# Patient Record
Sex: Female | Born: 1955 | Race: White | Hispanic: No | Marital: Married | Smoking: Former smoker
Health system: Southern US, Community
[De-identification: ages and names within clinical notes are randomized; demographics above are authoritative.]

## PROBLEM LIST (undated history)

## (undated) DIAGNOSIS — M75100 Unspecified rotator cuff tear or rupture of unspecified shoulder, not specified as traumatic: Secondary | ICD-10-CM

## (undated) DIAGNOSIS — M8580 Other specified disorders of bone density and structure, unspecified site: Secondary | ICD-10-CM

## (undated) DIAGNOSIS — R7303 Prediabetes: Secondary | ICD-10-CM

## (undated) DIAGNOSIS — B009 Herpesviral infection, unspecified: Secondary | ICD-10-CM

## (undated) DIAGNOSIS — F329 Major depressive disorder, single episode, unspecified: Secondary | ICD-10-CM

## (undated) DIAGNOSIS — T7840XA Allergy, unspecified, initial encounter: Secondary | ICD-10-CM

## (undated) DIAGNOSIS — K219 Gastro-esophageal reflux disease without esophagitis: Secondary | ICD-10-CM

## (undated) DIAGNOSIS — H269 Unspecified cataract: Secondary | ICD-10-CM

## (undated) DIAGNOSIS — J45909 Unspecified asthma, uncomplicated: Secondary | ICD-10-CM

## (undated) DIAGNOSIS — M722 Plantar fascial fibromatosis: Secondary | ICD-10-CM

## (undated) DIAGNOSIS — M7581 Other shoulder lesions, right shoulder: Secondary | ICD-10-CM

## (undated) DIAGNOSIS — R519 Headache, unspecified: Secondary | ICD-10-CM

## (undated) DIAGNOSIS — E785 Hyperlipidemia, unspecified: Secondary | ICD-10-CM

## (undated) DIAGNOSIS — F419 Anxiety disorder, unspecified: Secondary | ICD-10-CM

## (undated) DIAGNOSIS — E7800 Pure hypercholesterolemia, unspecified: Secondary | ICD-10-CM

## (undated) DIAGNOSIS — B379 Candidiasis, unspecified: Secondary | ICD-10-CM

## (undated) DIAGNOSIS — F319 Bipolar disorder, unspecified: Secondary | ICD-10-CM

## (undated) DIAGNOSIS — G43909 Migraine, unspecified, not intractable, without status migrainosus: Secondary | ICD-10-CM

## (undated) DIAGNOSIS — F32A Depression, unspecified: Secondary | ICD-10-CM

## (undated) DIAGNOSIS — A6000 Herpesviral infection of urogenital system, unspecified: Secondary | ICD-10-CM

## (undated) DIAGNOSIS — I10 Essential (primary) hypertension: Secondary | ICD-10-CM

## (undated) HISTORY — DX: Allergy, unspecified, initial encounter: T78.40XA

## (undated) HISTORY — DX: Hyperlipidemia, unspecified: E78.5

## (undated) HISTORY — DX: Headache, unspecified: R51.9

## (undated) HISTORY — DX: Other shoulder lesions, right shoulder: M75.81

## (undated) HISTORY — DX: Pure hypercholesterolemia, unspecified: E78.00

## (undated) HISTORY — DX: Bipolar disorder, unspecified: F31.9

## (undated) HISTORY — PX: CATARACT EXTRACTION, BILATERAL: SHX1313

## (undated) HISTORY — DX: Gastro-esophageal reflux disease without esophagitis: K21.9

## (undated) HISTORY — DX: Unspecified rotator cuff tear or rupture of unspecified shoulder, not specified as traumatic: M75.100

## (undated) HISTORY — DX: Major depressive disorder, single episode, unspecified: F32.9

## (undated) HISTORY — DX: Unspecified cataract: H26.9

## (undated) HISTORY — DX: Herpesviral infection, unspecified: B00.9

## (undated) HISTORY — DX: Depression, unspecified: F32.A

## (undated) HISTORY — DX: Migraine, unspecified, not intractable, without status migrainosus: G43.909

## (undated) HISTORY — DX: Unspecified asthma, uncomplicated: J45.909

## (undated) HISTORY — DX: Anxiety disorder, unspecified: F41.9

## (undated) HISTORY — DX: Prediabetes: R73.03

## (undated) HISTORY — DX: Plantar fascial fibromatosis: M72.2

## (undated) HISTORY — DX: Essential (primary) hypertension: I10

## (undated) HISTORY — PX: KNEE SURGERY: SHX244

---

## 1985-12-09 HISTORY — PX: CHOLECYSTECTOMY: SHX55

## 2000-07-08 ENCOUNTER — Encounter: Admission: RE | Admit: 2000-07-08 | Discharge: 2000-07-08 | Payer: Self-pay | Admitting: *Deleted

## 2000-07-08 ENCOUNTER — Encounter: Payer: Self-pay | Admitting: Family Medicine

## 2001-08-05 ENCOUNTER — Encounter: Admission: RE | Admit: 2001-08-05 | Discharge: 2001-08-05 | Payer: Self-pay | Admitting: Family Medicine

## 2001-08-05 ENCOUNTER — Encounter: Payer: Self-pay | Admitting: Family Medicine

## 2002-04-26 ENCOUNTER — Encounter (HOSPITAL_COMMUNITY): Admission: RE | Admit: 2002-04-26 | Discharge: 2002-05-26 | Payer: Self-pay | Admitting: Family Medicine

## 2002-05-20 ENCOUNTER — Ambulatory Visit (HOSPITAL_COMMUNITY): Admission: RE | Admit: 2002-05-20 | Discharge: 2002-05-20 | Payer: Self-pay | Admitting: Family Medicine

## 2002-05-20 ENCOUNTER — Encounter: Payer: Self-pay | Admitting: Family Medicine

## 2002-06-19 ENCOUNTER — Emergency Department (HOSPITAL_COMMUNITY): Admission: EM | Admit: 2002-06-19 | Discharge: 2002-06-20 | Payer: Self-pay | Admitting: Internal Medicine

## 2002-06-21 ENCOUNTER — Ambulatory Visit (HOSPITAL_COMMUNITY): Admission: RE | Admit: 2002-06-21 | Discharge: 2002-06-21 | Payer: Self-pay | Admitting: Internal Medicine

## 2002-06-21 ENCOUNTER — Encounter: Payer: Self-pay | Admitting: Internal Medicine

## 2002-09-09 ENCOUNTER — Ambulatory Visit (HOSPITAL_COMMUNITY): Admission: RE | Admit: 2002-09-09 | Discharge: 2002-09-09 | Payer: Self-pay | Admitting: Family Medicine

## 2002-09-09 ENCOUNTER — Encounter: Payer: Self-pay | Admitting: Family Medicine

## 2002-09-15 ENCOUNTER — Ambulatory Visit (HOSPITAL_COMMUNITY): Admission: RE | Admit: 2002-09-15 | Discharge: 2002-09-15 | Payer: Self-pay | Admitting: Family Medicine

## 2002-09-15 ENCOUNTER — Encounter: Payer: Self-pay | Admitting: Family Medicine

## 2002-11-15 ENCOUNTER — Emergency Department (HOSPITAL_COMMUNITY): Admission: EM | Admit: 2002-11-15 | Discharge: 2002-11-15 | Payer: Self-pay | Admitting: Emergency Medicine

## 2003-03-03 ENCOUNTER — Encounter: Payer: Self-pay | Admitting: *Deleted

## 2003-03-04 ENCOUNTER — Observation Stay (HOSPITAL_COMMUNITY): Admission: EM | Admit: 2003-03-04 | Discharge: 2003-03-05 | Payer: Self-pay | Admitting: *Deleted

## 2003-06-27 ENCOUNTER — Encounter: Payer: Self-pay | Admitting: Family Medicine

## 2003-06-27 ENCOUNTER — Ambulatory Visit (HOSPITAL_COMMUNITY): Admission: RE | Admit: 2003-06-27 | Discharge: 2003-06-27 | Payer: Self-pay | Admitting: Family Medicine

## 2003-06-28 ENCOUNTER — Encounter (HOSPITAL_COMMUNITY): Admission: RE | Admit: 2003-06-28 | Discharge: 2003-07-28 | Payer: Self-pay | Admitting: Family Medicine

## 2003-08-22 ENCOUNTER — Inpatient Hospital Stay (HOSPITAL_COMMUNITY): Admission: EM | Admit: 2003-08-22 | Discharge: 2003-08-29 | Payer: Self-pay | Admitting: Psychiatry

## 2003-09-12 ENCOUNTER — Encounter: Payer: Self-pay | Admitting: Family Medicine

## 2003-09-12 ENCOUNTER — Ambulatory Visit (HOSPITAL_COMMUNITY): Admission: RE | Admit: 2003-09-12 | Discharge: 2003-09-12 | Payer: Self-pay | Admitting: Family Medicine

## 2004-02-24 ENCOUNTER — Ambulatory Visit (HOSPITAL_COMMUNITY): Admission: RE | Admit: 2004-02-24 | Discharge: 2004-02-24 | Payer: Self-pay | Admitting: Family Medicine

## 2004-06-11 ENCOUNTER — Emergency Department (HOSPITAL_COMMUNITY): Admission: EM | Admit: 2004-06-11 | Discharge: 2004-06-11 | Payer: Self-pay | Admitting: Emergency Medicine

## 2004-08-22 ENCOUNTER — Ambulatory Visit: Payer: Self-pay | Admitting: Psychiatry

## 2004-09-13 ENCOUNTER — Ambulatory Visit (HOSPITAL_COMMUNITY): Admission: RE | Admit: 2004-09-13 | Discharge: 2004-09-13 | Payer: Self-pay | Admitting: Family Medicine

## 2004-11-08 ENCOUNTER — Ambulatory Visit: Payer: Self-pay | Admitting: Psychiatry

## 2004-12-17 ENCOUNTER — Ambulatory Visit: Payer: Self-pay | Admitting: Psychiatry

## 2005-01-30 ENCOUNTER — Ambulatory Visit: Payer: Self-pay | Admitting: Psychiatry

## 2005-04-02 ENCOUNTER — Ambulatory Visit: Payer: Self-pay | Admitting: Psychiatry

## 2005-05-02 ENCOUNTER — Emergency Department (HOSPITAL_COMMUNITY): Admission: EM | Admit: 2005-05-02 | Discharge: 2005-05-02 | Payer: Self-pay | Admitting: Emergency Medicine

## 2005-05-28 ENCOUNTER — Ambulatory Visit: Payer: Self-pay | Admitting: Psychiatry

## 2005-07-02 ENCOUNTER — Ambulatory Visit: Payer: Self-pay | Admitting: Psychiatry

## 2005-12-09 DIAGNOSIS — H269 Unspecified cataract: Secondary | ICD-10-CM

## 2005-12-09 HISTORY — DX: Unspecified cataract: H26.9

## 2006-01-02 ENCOUNTER — Ambulatory Visit: Payer: Self-pay | Admitting: Psychiatry

## 2006-02-12 ENCOUNTER — Ambulatory Visit (HOSPITAL_COMMUNITY): Payer: Self-pay | Admitting: Psychiatry

## 2006-02-20 ENCOUNTER — Ambulatory Visit (HOSPITAL_COMMUNITY): Payer: Self-pay | Admitting: Psychiatry

## 2006-02-25 ENCOUNTER — Ambulatory Visit (HOSPITAL_COMMUNITY): Admission: RE | Admit: 2006-02-25 | Discharge: 2006-02-25 | Payer: Self-pay | Admitting: Family Medicine

## 2006-04-14 ENCOUNTER — Ambulatory Visit (HOSPITAL_COMMUNITY): Admission: RE | Admit: 2006-04-14 | Discharge: 2006-04-14 | Payer: Self-pay | Admitting: Family Medicine

## 2006-04-15 ENCOUNTER — Ambulatory Visit (HOSPITAL_COMMUNITY): Payer: Self-pay | Admitting: Psychiatry

## 2006-06-23 ENCOUNTER — Encounter (INDEPENDENT_AMBULATORY_CARE_PROVIDER_SITE_OTHER): Payer: Self-pay | Admitting: Specialist

## 2006-06-23 ENCOUNTER — Observation Stay (HOSPITAL_COMMUNITY): Admission: RE | Admit: 2006-06-23 | Discharge: 2006-06-24 | Payer: Self-pay | Admitting: Obstetrics and Gynecology

## 2006-07-23 ENCOUNTER — Ambulatory Visit (HOSPITAL_COMMUNITY): Admission: RE | Admit: 2006-07-23 | Discharge: 2006-07-23 | Payer: Self-pay | Admitting: Family Medicine

## 2006-08-04 ENCOUNTER — Ambulatory Visit: Admission: RE | Admit: 2006-08-04 | Discharge: 2006-08-04 | Payer: Self-pay | Admitting: Family Medicine

## 2006-08-07 ENCOUNTER — Ambulatory Visit: Payer: Self-pay | Admitting: Pulmonary Disease

## 2006-09-18 ENCOUNTER — Ambulatory Visit (HOSPITAL_COMMUNITY): Admission: RE | Admit: 2006-09-18 | Discharge: 2006-09-18 | Payer: Self-pay | Admitting: Family Medicine

## 2006-11-18 ENCOUNTER — Ambulatory Visit (HOSPITAL_COMMUNITY): Payer: Self-pay | Admitting: Psychiatry

## 2006-12-02 ENCOUNTER — Emergency Department (HOSPITAL_COMMUNITY): Admission: EM | Admit: 2006-12-02 | Discharge: 2006-12-03 | Payer: Self-pay | Admitting: Emergency Medicine

## 2006-12-11 ENCOUNTER — Ambulatory Visit (HOSPITAL_COMMUNITY): Payer: Self-pay | Admitting: Psychiatry

## 2006-12-29 ENCOUNTER — Ambulatory Visit (HOSPITAL_COMMUNITY): Payer: Self-pay | Admitting: Psychiatry

## 2006-12-30 ENCOUNTER — Ambulatory Visit (HOSPITAL_COMMUNITY): Payer: Self-pay | Admitting: Psychiatry

## 2007-02-09 ENCOUNTER — Ambulatory Visit (HOSPITAL_COMMUNITY): Payer: Self-pay | Admitting: Psychiatry

## 2007-02-26 ENCOUNTER — Ambulatory Visit (HOSPITAL_COMMUNITY): Payer: Self-pay | Admitting: Psychiatry

## 2007-04-28 ENCOUNTER — Ambulatory Visit (HOSPITAL_COMMUNITY): Payer: Self-pay | Admitting: Psychiatry

## 2007-04-30 ENCOUNTER — Ambulatory Visit (HOSPITAL_COMMUNITY): Payer: Self-pay | Admitting: Psychiatry

## 2007-07-09 ENCOUNTER — Ambulatory Visit (HOSPITAL_COMMUNITY): Payer: Self-pay | Admitting: Psychiatry

## 2007-07-21 ENCOUNTER — Ambulatory Visit (HOSPITAL_COMMUNITY): Payer: Self-pay | Admitting: Psychiatry

## 2007-07-22 ENCOUNTER — Encounter (HOSPITAL_COMMUNITY): Admission: RE | Admit: 2007-07-22 | Discharge: 2007-08-21 | Payer: Self-pay | Admitting: Family Medicine

## 2007-07-23 ENCOUNTER — Ambulatory Visit (HOSPITAL_COMMUNITY): Payer: Self-pay | Admitting: Psychiatry

## 2007-07-31 ENCOUNTER — Ambulatory Visit (HOSPITAL_COMMUNITY): Payer: Self-pay | Admitting: Psychiatry

## 2007-08-07 ENCOUNTER — Ambulatory Visit (HOSPITAL_COMMUNITY): Payer: Self-pay | Admitting: Psychiatry

## 2007-08-19 ENCOUNTER — Ambulatory Visit (HOSPITAL_COMMUNITY): Payer: Self-pay | Admitting: Psychiatry

## 2007-09-07 ENCOUNTER — Ambulatory Visit (HOSPITAL_COMMUNITY): Payer: Self-pay | Admitting: Psychiatry

## 2007-09-15 ENCOUNTER — Ambulatory Visit (HOSPITAL_COMMUNITY): Payer: Self-pay | Admitting: Psychiatry

## 2007-10-07 ENCOUNTER — Ambulatory Visit (HOSPITAL_COMMUNITY): Payer: Self-pay | Admitting: Psychiatry

## 2007-10-12 ENCOUNTER — Ambulatory Visit (HOSPITAL_COMMUNITY): Payer: Self-pay | Admitting: Psychiatry

## 2007-10-29 ENCOUNTER — Ambulatory Visit (HOSPITAL_COMMUNITY): Payer: Self-pay | Admitting: Psychiatry

## 2007-11-09 ENCOUNTER — Ambulatory Visit (HOSPITAL_COMMUNITY): Payer: Self-pay | Admitting: Psychiatry

## 2007-11-10 ENCOUNTER — Ambulatory Visit (HOSPITAL_COMMUNITY): Payer: Self-pay | Admitting: Psychiatry

## 2007-11-20 ENCOUNTER — Ambulatory Visit (HOSPITAL_COMMUNITY): Payer: Self-pay | Admitting: Psychiatry

## 2008-01-29 ENCOUNTER — Ambulatory Visit (HOSPITAL_COMMUNITY): Payer: Self-pay | Admitting: Psychiatry

## 2008-02-11 ENCOUNTER — Ambulatory Visit (HOSPITAL_COMMUNITY): Payer: Self-pay | Admitting: Psychiatry

## 2008-02-24 ENCOUNTER — Ambulatory Visit (HOSPITAL_COMMUNITY): Payer: Self-pay | Admitting: Psychiatry

## 2008-04-21 ENCOUNTER — Ambulatory Visit (HOSPITAL_COMMUNITY): Payer: Self-pay | Admitting: Psychiatry

## 2008-05-10 ENCOUNTER — Ambulatory Visit (HOSPITAL_COMMUNITY): Payer: Self-pay | Admitting: Psychiatry

## 2008-05-13 ENCOUNTER — Ambulatory Visit (HOSPITAL_COMMUNITY): Payer: Self-pay | Admitting: Psychiatry

## 2008-05-24 ENCOUNTER — Ambulatory Visit (HOSPITAL_COMMUNITY): Payer: Self-pay | Admitting: Psychiatry

## 2008-08-11 ENCOUNTER — Ambulatory Visit (HOSPITAL_COMMUNITY): Payer: Self-pay | Admitting: Psychiatry

## 2008-08-23 ENCOUNTER — Ambulatory Visit (HOSPITAL_COMMUNITY): Admission: RE | Admit: 2008-08-23 | Discharge: 2008-08-23 | Payer: Self-pay | Admitting: Family Medicine

## 2008-09-17 ENCOUNTER — Emergency Department (HOSPITAL_COMMUNITY): Admission: EM | Admit: 2008-09-17 | Discharge: 2008-09-17 | Payer: Self-pay | Admitting: Emergency Medicine

## 2008-10-07 ENCOUNTER — Ambulatory Visit (HOSPITAL_COMMUNITY): Payer: Self-pay | Admitting: Psychiatry

## 2008-11-08 ENCOUNTER — Ambulatory Visit (HOSPITAL_COMMUNITY): Payer: Self-pay | Admitting: Psychiatry

## 2008-11-09 ENCOUNTER — Ambulatory Visit (HOSPITAL_COMMUNITY): Payer: Self-pay | Admitting: Psychiatry

## 2008-12-21 ENCOUNTER — Ambulatory Visit (HOSPITAL_COMMUNITY): Payer: Self-pay | Admitting: Psychiatry

## 2008-12-22 ENCOUNTER — Ambulatory Visit (HOSPITAL_COMMUNITY): Payer: Self-pay | Admitting: Psychiatry

## 2009-02-09 ENCOUNTER — Ambulatory Visit (HOSPITAL_COMMUNITY): Payer: Self-pay | Admitting: Psychiatry

## 2009-03-06 ENCOUNTER — Ambulatory Visit (HOSPITAL_COMMUNITY): Payer: Self-pay | Admitting: Psychiatry

## 2009-03-12 ENCOUNTER — Emergency Department (HOSPITAL_COMMUNITY): Admission: EM | Admit: 2009-03-12 | Discharge: 2009-03-12 | Payer: Self-pay | Admitting: Emergency Medicine

## 2009-03-16 ENCOUNTER — Ambulatory Visit (HOSPITAL_COMMUNITY): Admission: RE | Admit: 2009-03-16 | Discharge: 2009-03-16 | Payer: Self-pay | Admitting: Family Medicine

## 2009-03-23 ENCOUNTER — Ambulatory Visit (HOSPITAL_COMMUNITY): Payer: Self-pay | Admitting: Psychiatry

## 2009-04-13 ENCOUNTER — Emergency Department (HOSPITAL_COMMUNITY): Admission: EM | Admit: 2009-04-13 | Discharge: 2009-04-13 | Payer: Self-pay | Admitting: Emergency Medicine

## 2009-04-24 ENCOUNTER — Ambulatory Visit: Payer: Self-pay | Admitting: Orthopedic Surgery

## 2009-04-24 DIAGNOSIS — G8929 Other chronic pain: Secondary | ICD-10-CM | POA: Insufficient documentation

## 2009-04-24 DIAGNOSIS — M25519 Pain in unspecified shoulder: Secondary | ICD-10-CM

## 2009-04-24 DIAGNOSIS — M7580 Other shoulder lesions, unspecified shoulder: Secondary | ICD-10-CM

## 2009-04-26 ENCOUNTER — Encounter: Payer: Self-pay | Admitting: Orthopedic Surgery

## 2010-12-29 ENCOUNTER — Encounter: Payer: Self-pay | Admitting: Family Medicine

## 2011-03-19 LAB — DIFFERENTIAL
Basophils Absolute: 0 10*3/uL (ref 0.0–0.1)
Basophils Relative: 0 % (ref 0–1)
Eosinophils Absolute: 0.4 10*3/uL (ref 0.0–0.7)
Eosinophils Relative: 5 % (ref 0–5)
Monocytes Relative: 8 % (ref 3–12)
Neutrophils Relative %: 63 % (ref 43–77)

## 2011-03-19 LAB — POCT I-STAT, CHEM 8
BUN: 25 mg/dL — ABNORMAL HIGH (ref 6–23)
Calcium, Ion: 1.16 mmol/L (ref 1.12–1.32)
Chloride: 110 mEq/L (ref 96–112)
Creatinine, Ser: 1.3 mg/dL — ABNORMAL HIGH (ref 0.4–1.2)
Glucose, Bld: 98 mg/dL (ref 70–99)
Hemoglobin: 12.9 g/dL (ref 12.0–15.0)

## 2011-03-19 LAB — GLUCOSE, CAPILLARY: Glucose-Capillary: 98 mg/dL (ref 70–99)

## 2011-03-19 LAB — CBC
MCHC: 33.5 g/dL (ref 30.0–36.0)
WBC: 8.6 10*3/uL (ref 4.0–10.5)

## 2011-04-26 NOTE — Op Note (Signed)
NAME:  Peggy Fitzgerald, Peggy Fitzgerald           ACCOUNT NO.:  [id number]   MEDICAL RECORD NO.:  [id number]          PATIENT TYPE:  AMB   LOCATION:  DAY                           FACILITY:  [facility]   PHYSICIAN:  Tilda Burrow, M.D. DATE OF BIRTH:  [birth year]   DATE OF PROCEDURE:  06/23/2006  DATE OF DISCHARGE:                                 OPERATIVE REPORT   PREOPERATIVE DIAGNOSIS:  Endometrial polyp, rectocele.   POSTOPERATIVE DIAGNOSIS:  Endometrial polyp, rectocele.   PROCEDURE:  Hysteroscopy, D&C, followed by posterior vaginal repair.   SURGEON:  Tilda Burrow, M.D.   ASSISTANTAmie Critchley, CST.   ANESTHESIA:  General.   COMPLICATIONS:  None.   FINDINGS:  Thinner endometrium than suspected.  No evidence of endometrial  polyp.  Curettage resulted in only tiny atrophic tissue, scanty amounts, and  hysteroscopy confirmed no significant polyps in the endometrium.  The  etiology of the abnormal ultrasound is unclear.   DETAILS OF PROCEDURE:  The patient was taken to the operating room and  prepped and draped for vaginal procedure.  With the cervix grasped with a  single-tooth tenaculum, cervix dilated to 27 [place], followed by  visualization with a 30-degree hysteroscope.  This showed a then  endometrium.  Both tubal ostia could be seen bilaterally.  The endometrial  polyp suspected from prior biopsy was not present.  Curettage was performed,  but the amount of tissue was so scanty as to be nondiagnostic.  Since we had  a prior outpatient biopsy that was negative, this was not necessary to be  sent, so therefore efforts to collect enough for a sample were abandoned.   Posterior repair:  The posterior vaginal mucosa was grasped with two Allis  clamps at the hymen remnants, and a third clamp placed approximately 5 cm up  the posterior vaginal wall.  The mucosa was split, undermined from the  adjacent tissues, and we were able to identify a distinct band of perineal  tissue that could  be pulled into the midline and improve the perineal  support.  This was grasped with Allis clamps, while a double-gloved right  index finger was placed in the rectum.  Sutures were then placed, pulling  the endoperineal tissue in to reinforce the perineal body and reduce the  rectocele.  This was judged to be quite successful.   The vaginal mucosa was trimmed of some irregular redundant edges, and then  after a series of approximately 8 deep stitches of 0 Dexon, two additional  stitches were placed to rebuild the perineal body and reduce the introital  size slightly.  This was successful, and then the remaining vaginal mucosa  was trimmed and pulled together with 2-0 chromic in a running fashion.  The  patient tolerated the procedure well.  Sponge and needle counts were  correct.      Tilda Burrow, M.D.  Electronically Signed     JVF/MEDQ  D:  06/23/2006  T:  06/23/2006  Job:  [id number]

## 2011-04-26 NOTE — Procedures (Signed)
NAME:  Peggy Fitzgerald, Peggy Fitzgerald           ACCOUNT NO.:  [id number]   MEDICAL RECORD NO.:  [id number]          PATIENT TYPE:  OUT   LOCATION:  SLEEP LAB                     FACILITY:  [facility]   PHYSICIAN:  Barbaraann Share, MD,FCCPDATE OF BIRTH:  [birth year]   DATE OF STUDY:  08/04/2006                              NOCTURNAL POLYSOMNOGRAM   REFERRING PHYSICIAN:  Dr. Lilyan Punt   INDICATION FOR THE STUDY:  Persistent disorder of initiating and maintaining  sleep; also, hypersomnia with sleep apnea.   EPWORTH SCORE:  9   SLEEP ARCHITECTURE:  The patient had a total sleep time of 379 minutes with  decreased REM and very little slow wave sleep.  Sleep onset latency was  normal and REM onset was prolonged at 208 minutes.  Sleep efficiency was  decreased at 82%.   RESPIRATORY DATA:  The patient underwent split night protocol where she was  found to have 45 events in the first 93 minutes of sleep.  This gave her a  respiratory disturbance index of 14 events per hour.  The patient was noted  to have moderate snoring, and the events were worse in the supine position.  By protocol the patient was placed on a Respironics small Comfort Select  nasal mask and chin strap was added because of mouth opening.  CPAP  titration was initiated and increased as high as 7 cm of water pressure.  During this time the patient failed to have complete control of her  obstructive events, and there was an increased number of central events.  The pressure was not increased further because of a lack time in order to  achieve adequate titration.   OXYGEN DATA:  There was O2 desaturation as low as 89%.   CARDIAC DATA:  No clinically significant cardiac arrhythmias.   MOVEMENTS/PARASOMNIA:  The patient was found have 69 leg jerks with 1.3 per  hour resulting in arousal or awakening.  There were no abnormal behaviors  noted.   IMPRESSION/RECOMMENDATION:  1. Split night study reveals mild obstructive sleep  apnea/hypopnea      syndrome with a respiratory disturbance index of 14 events per hour      during the first half of the night and oxygen desaturation as low as      89%.  The patient was then placed on a Respironics small Comfort Select      mask with chin strap and was titrated to 7 cm of water.  At this level      the patient continued to have obstructive events and was also noted to      have increased numbers of central events.  Appropriate CPAP titration      was not able to be obtained secondary to a lack of time during the      study, as well as the patient having mixed sleep apnea.  At this point      in time I would highly recommend a formal CPAP titration study where we      can try and resolve both the patient's obstructive and central events.      She may have to be changed over  to BiPAP in order to decrease the      numbers of centrals.  2. Small to moderate numbers of leg jerks with very little sleep      disruption.                                            ______________________________  Barbaraann Share, MD,FCCP  Diplomate, American Board of Sleep  Medicine     KMC/MEDQ  D:  08/08/2006 10:24:44  T:  08/08/2006 16:52:28  Job:  [id number]

## 2011-04-26 NOTE — Discharge Summary (Signed)
   NAME:  Peggy Fitzgerald, Peggy Fitzgerald                     ACCOUNT NO.:  [id number]   MEDICAL RECORD NO.:  [id number]                   PATIENT TYPE:  OBV   LOCATION:  A321                                 FACILITY:  [facility]   PHYSICIAN:  Scott A. Gerda Diss, M.D.               DATE OF BIRTH:  1956-09-30   DATE OF ADMISSION:  03/03/2003  DATE OF DISCHARGE:  03/05/2003                                 DISCHARGE SUMMARY   DISCHARGE DIAGNOSES:  1. Viral gastroenteritis.  2. Serotonin withdrawal syndrome.   HISTORY OF PRESENT ILLNESS:  This 55 year old white female presented with  nausea, dizziness, vomiting with a chief complaint per above.  The patient  had three to four days of feeling dizzy, nauseous, vomiting, not feeling  good.  Denied diarrhea, denied fever or chills, related headache and some  muscle aches, some discomfort.  She had run out of her Effexor approximately  a week previous.   PAST MEDICAL HISTORY:  1. Migraines.  2. Depression.   SOCIAL HISTORY:  Under a lot of stress.  Husband lives in [place].  She  lives here and has a pregnant daughter and a son-in-law who is still not  working.   REVIEW OF SYSTEMS:  Per above.   PHYSICAL EXAMINATION:  HEENT:  Benign.  CHEST:  CTA.  HEART:  Regular.  ABDOMEN:  Soft.  Pulses normal.  EXTREMITIES:  No edema.   LABORATORY DATA:  Unremarkable.   HOSPITAL COURSE:  The patient was given IV fluids, pain medications, and  restarted on Effexor and started feeling much better within 24 to 36 hours.  On the morning of 03/05/2003 she was feeling better, taking solids and  tolerating them well without nausea.  She was felt to be stable for  discharge.    DISCHARGE INSTRUCTIONS:  Discharged to home.  Continue her usual  medications.  Get her Effexor on a t.i.d. basis.  Vicodin p.r.n. pain and  Phenergan p.r.n. nausea.   FOLLOW UP:  The patient is to follow up in the office in five days.                                               Scott A.  Gerda Diss, M.D.    Linus Orn  D:  03/05/2003  T:  03/06/2003  Job:  [id number]

## 2011-04-26 NOTE — H&P (Signed)
NAME:  Peggy Fitzgerald, Peggy Fitzgerald NO.:  [id number]   MEDICAL RECORD NO.:  [id number]                   PATIENT TYPE:  IPS   LOCATION:  0506                                 FACILITY[facility]  [facility]   PHYSICIAN:  Jeanice Lim, M.D.              DATE OF BIRTH:  [birth year]   DATE OF ADMISSION:  08/22/2003  DATE OF DISCHARGE:                         PSYCHIATRIC ADMISSION ASSESSMENT   IDENTIFYING INFORMATION:  This is a 55 year old separated white female  voluntarily admitted on August 22, 2003.   HISTORY OF PRESENT ILLNESS:  The patient presents with history of  intentional overdose on 10 Ativan tablets 0.5 mg on Sunday at her mother's  home.  The patient states that she just snapped at home.  She states she  is getting tired of her daughter and her son-in-law that have been living  with her for the past two years.  Her husband has been working in [place]  for many months, has not seen him since May.  He is there for employment.  She states she does not like her son-in-law.  She waited until her family  left and decided to take a few pills and then a few more after that.  The  patient wrote a note to her parents and called her brother, realizing what  she had done.  She feels very overwhelmed and hopeless.  She has been  compliant with her medication.  Denies any psychotic symptoms, has not been  sleeping well.   PAST PSYCHIATRIC HISTORY:  First admission to [facility].  Sees Dr. Milford Cage as an outpatient.  Was hospitalized before depression  in [place]. [facility].  No history of a suicide attempt.   SOCIAL HISTORY:  This is a 55 year old separated white female, married for  21 years, first marriage.  Has three children, ages 79, 48 and 54.  She  lives with her children.  No legal problems.  Her husband works as a Runner, broadcasting/film/video  in [place], his second school year, and has not seen him since May.   FAMILY HISTORY:  Father depressed.   ALCOHOL/DRUG HISTORY:   Nonsmoker.  Denies any alcohol or drug use.   PRIMARY CARE PHYSICIAN:  [facility].   MEDICAL PROBLEMS:  Migraines, vertigo and back pain.   MEDICATIONS:  1. Frova 2.5 mg at onset of a headache, can repeat in two hours.  2. Prempro 0.625\2.5 mg q.h.s.  3. Effexor XR 37.5 mg q.d.  4. Darvocet-N 100 mg for headaches.  5. Topamax 25 mg,  4 at bedtime, takes as a mood stabilizer, has been on     that for one month.  6. Ambien 20 mg q.h.s.  7. Antivert 25 mg q.6h.  8. Anaprox DS 550 mg b.i.d.   ALLERGIES:  No known allergies.   PHYSICAL EXAMINATION:  Done at [facility].  Physical examination done today  shows no significant findings.  The patient has no focal  neurological  findings.  The patient is an overweight female with stable vital signs,  temperature 98.6, heart rate 80, respirations 18, blood pressure 117/73.  [name] 5 feet 6 inches tall.   LABORATORY DATA:  Urine drug screen was negative.  CMET was within normal  limits.   MENTAL STATUS EXAMINATION:  [name] an alert, overweight, casually dressed  female with fair eye contact.  Speech is clear.  Mood is depressed.  The  patient is overwhelmed.  Affect is tearful.  Thought processes are coherent.  No evidence of psychosis.  No suicidal or homicidal ideation.  Cognitive  function is intact.  Memory is fair.  Judgment and insight is fair.   DIAGNOSES:   AXIS I:  Major depression with a suicide attempt.   AXIS II:  Deferred.   AXIS III:  1. Migraines.  2. Vertigo.  3. Chronic back pain.   AXIS IV:  Problems with primary support group and lack of support, other  psychosocial problems related to husband's long distance relationship.   AXIS V:  Current 35-40.   PLAN:  Voluntary admission for intentional overdose.  Contract for safety.  Check every 15 minutes.  Stabilize mood and thinking so patient can be safe.  Will resume her medications.  Will consider increasing patient's Effexor to  decrease  depressive symptoms.  Consider a family session.  The patient is to  consider with a Dr. Katrinka Blazing and to continue to be medication-compliant.   TENTATIVE LENGTH OF STAY:  Four to five days.     Landry Corporal, N.P.                       Jeanice Lim, M.D.    JO/MEDQ  D:  08/22/2003  T:  08/23/2003  Job:  [id number]

## 2011-04-26 NOTE — Discharge Summary (Signed)
NAME:  Peggy Fitzgerald, Peggy Fitzgerald                     ACCOUNT NO.:  [id number]   MEDICAL RECORD NO.:  [id number]                   PATIENT TYPE:  IPS   LOCATION:  0506                                 [facility]ACILITY[facility]   PHYSICIAN:  Geoffery Lyons, M.D.                   DATE OF BIRTH:  Oct 26, 1956   DATE OF ADMISSION:  08/22/2003  DATE OF DISCHARGE:  08/29/2003                                 DISCHARGE SUMMARY   CHIEF COMPLAINT AND PRESENT ILLNESS:  This was the first admission to [facility] for this 55 year old separated white female  voluntarily admitted.  She had a history of intentional overdose of Ativan  10 pills of 0.5 mg and at her mother's house.  She claimed she just snapped  at home, got tired of her daughter.  A similar episode happened two years  prior to this admission.  Husband worked in [place].  She had not seen him  since May.  She did not like her son-in-law.  She waited, when family left  decided to take a few pills, then a few more.  She wrote a note to her  parents, called her brother.  She felt overwhelmed.   PAST PSYCHIATRIC HISTORY:  This was the first time at [facility].  She had seen Jasmine Pang, M.D.  She had been treated at [facility].  [facility] for depression on an outpatient basis.   SUBSTANCE ABUSE HISTORY:  She denied the use or abuse of any substances.   PAST MEDICAL HISTORY:  1. Migraine.  2. Vertigo.  3. Back pain.   MEDICATIONS:  1. Frova 2.5 mg at the onset of headache.  2. Prempro 0.625/2.5 mg daily.  3. Effexor XR 225 mg daily.  4. Darvocet-N 100 mg as needed for headache.  5. Topamax 25 mg four at night.  6. Ambien 10 mg as needed for sleep.  7. Antivert 25 mg every six hours.   PHYSICAL EXAMINATION:  Physical examination was performed, failed to show  any acute findings.   MENTAL STATUS EXAM:  Mental status exam upon admission revealed a well  developed, somewhat overweight, casually dressed female, little  eye contact.  Speech was clear; normal tempo, production, speed.  Mood: Depressed.  Affect: Depressed.  Thought content dealt with being overwhelmed, all the  stressors, issues in conflict in the relationship with the daughter, the  fact that her husband was in [place], having a lot of resentment and anger  toward the husband for being there and not keeping up with her as he  promised he was going to do.  But, she denied any active suicidal or  homicidal ideations.  There was no evidence of any hallucinations or  delusional ideas.  Cognitive: Cognition was well preserved.   ADMISSION DIAGNOSES:   AXIS I:  Major depression, recurrent.   AXIS II:  No diagnosis.   AXIS III:  1. Migraine headache.  2. Vertigo.  3. Chronic back pain.   AXIS IV:  Moderate.   AXIS V:  Global assessment of functioning upon admission 35-40, highest  global assessment of functioning in the last year 65-70.   LABORATORY DATA:  CBC was within normal limits.  Blood chemistries were  within normal limits.  Thyroid profile was within normal limits.   HOSPITAL COURSE:  She was admitted and started in intensive individual and  group psychotherapy.  We went ahead and maintained the Frova 2.5 mg at the  onset of headache, Topamax 100 mg at night, Prempro 0.625/2.5 mg every day,  Effexor XR 225 mg per day, Darvocet-N 100 every six hours as needed for  pain, Ambien 10 mg as needed for sleep, Anaprox DS 550 mg twice a day,  Ativan 0.5 mg every six hours as needed for anxiety.  She was also given the  Antivert 25 mg every six hours.  She expressed the fact that she was in a  very difficult situation, been having a hard time sleeping, worrying.  She  stated that no one thought she was this bad and just now, because of what  she did, her mother and her brother realized how bad she was feeling.  She  claimed she just snapped.  Among the other stressors, she felt that she  because she had allowed the older daughter to  stay with her in the house,  her younger children had to be living with her father, which was not a good  situation.  She endorsed a lot of anxiety, ruminating, worrying.  She was  placed on Seroquel 25 mg twice a day and 50 mg at night.  She continued to  be very upset, tried to interview the husband in [place] to try to clarify  things.  But, she endorsed that she was feeling confused, not knowing what  to do.  Thought about having a session with the husband over the phone but  was having difficulty trying to make up her mind.  There was a family  session with her parents in which she cried a lot and she was able to bring  stuff from all these years that she had been keeping to herself.  She  continued to be very tearful.  She called the husband and she got what she  perceived to be mixed messages.  She claimed that the husband said that he  was wanting her to come down to [place] and he was going to quit paying for  the bills while she was in [place].  She had some fleeting suicidal  ideation but responded to staff.  She was able to keep opening up, discussed  her anxiety and depression associated with the relationship with the  husband.  On September 20, she was in full contact with reality.  There were  no suicidal ideas, no homicidal ideas, not sure what was going on, what to  do with the husband, but felt that she could be safely be discharged home  and continue on an outpatient followup.   DISCHARGE DIAGNOSES:   AXIS I:  Major depression, recurrent.   AXIS II:  No diagnosis.   AXIS III:  1. Migraine.  2. Vertigo.  3. Chronic back pain.   AXIS IV:  Moderate.   AXIS V:  Global assessment of functioning upon discharge 50.   DISCHARGE MEDICATIONS:  1. Topamax 100 mg at night.  2. Prempro 0.625/2.5 mg daily.  3. Anaprox DS 550 mg twice a day.  4. Seroquel 25 mg twice a day and 300 mg at night.  5. Effexor XR 150 mg twice a day.  FOLLOW UP:  She was to follow up at  [facility], Jasmine Pang,  M.D.                                               Geoffery Lyons, M.D.    IL/MEDQ  D:  09/28/2003  T:  09/30/2003  Job:  [id number]

## 2011-10-14 ENCOUNTER — Other Ambulatory Visit: Payer: Self-pay | Admitting: Family Medicine

## 2011-10-14 DIAGNOSIS — Z1231 Encounter for screening mammogram for malignant neoplasm of breast: Secondary | ICD-10-CM

## 2011-11-01 ENCOUNTER — Ambulatory Visit: Payer: Self-pay

## 2015-12-13 ENCOUNTER — Encounter: Payer: Self-pay | Admitting: Family Medicine

## 2015-12-13 DIAGNOSIS — R7303 Prediabetes: Secondary | ICD-10-CM | POA: Insufficient documentation

## 2015-12-13 DIAGNOSIS — I10 Essential (primary) hypertension: Secondary | ICD-10-CM | POA: Insufficient documentation

## 2015-12-13 DIAGNOSIS — B009 Herpesviral infection, unspecified: Secondary | ICD-10-CM | POA: Insufficient documentation

## 2015-12-13 DIAGNOSIS — F32A Depression, unspecified: Secondary | ICD-10-CM | POA: Insufficient documentation

## 2015-12-13 DIAGNOSIS — E785 Hyperlipidemia, unspecified: Secondary | ICD-10-CM | POA: Insufficient documentation

## 2015-12-13 DIAGNOSIS — J45909 Unspecified asthma, uncomplicated: Secondary | ICD-10-CM | POA: Insufficient documentation

## 2015-12-13 DIAGNOSIS — F329 Major depressive disorder, single episode, unspecified: Secondary | ICD-10-CM | POA: Insufficient documentation

## 2015-12-13 DIAGNOSIS — F419 Anxiety disorder, unspecified: Secondary | ICD-10-CM | POA: Insufficient documentation

## 2015-12-13 DIAGNOSIS — T7840XA Allergy, unspecified, initial encounter: Secondary | ICD-10-CM | POA: Insufficient documentation

## 2015-12-13 DIAGNOSIS — K219 Gastro-esophageal reflux disease without esophagitis: Secondary | ICD-10-CM | POA: Insufficient documentation

## 2015-12-18 ENCOUNTER — Ambulatory Visit (INDEPENDENT_AMBULATORY_CARE_PROVIDER_SITE_OTHER): Admitting: Physician Assistant

## 2015-12-18 ENCOUNTER — Encounter: Payer: Self-pay | Admitting: Physician Assistant

## 2015-12-18 VITALS — BP 152/90 | HR 64 | Temp 98.5°F | Resp 18 | Ht 65.25 in | Wt 175.0 lb

## 2015-12-18 DIAGNOSIS — R42 Dizziness and giddiness: Secondary | ICD-10-CM

## 2015-12-18 DIAGNOSIS — R51 Headache: Secondary | ICD-10-CM | POA: Diagnosis not present

## 2015-12-18 DIAGNOSIS — F419 Anxiety disorder, unspecified: Secondary | ICD-10-CM

## 2015-12-18 DIAGNOSIS — M25519 Pain in unspecified shoulder: Secondary | ICD-10-CM | POA: Diagnosis not present

## 2015-12-18 DIAGNOSIS — K219 Gastro-esophageal reflux disease without esophagitis: Secondary | ICD-10-CM | POA: Diagnosis not present

## 2015-12-18 DIAGNOSIS — I10 Essential (primary) hypertension: Secondary | ICD-10-CM

## 2015-12-18 DIAGNOSIS — F32A Depression, unspecified: Secondary | ICD-10-CM

## 2015-12-18 DIAGNOSIS — B009 Herpesviral infection, unspecified: Secondary | ICD-10-CM | POA: Diagnosis not present

## 2015-12-18 DIAGNOSIS — J4520 Mild intermittent asthma, uncomplicated: Secondary | ICD-10-CM

## 2015-12-18 DIAGNOSIS — Z7189 Other specified counseling: Secondary | ICD-10-CM

## 2015-12-18 DIAGNOSIS — R7303 Prediabetes: Secondary | ICD-10-CM | POA: Diagnosis not present

## 2015-12-18 DIAGNOSIS — F329 Major depressive disorder, single episode, unspecified: Secondary | ICD-10-CM

## 2015-12-18 DIAGNOSIS — G4700 Insomnia, unspecified: Secondary | ICD-10-CM | POA: Diagnosis not present

## 2015-12-18 DIAGNOSIS — Z7689 Persons encountering health services in other specified circumstances: Secondary | ICD-10-CM

## 2015-12-18 DIAGNOSIS — E785 Hyperlipidemia, unspecified: Secondary | ICD-10-CM | POA: Diagnosis not present

## 2015-12-18 DIAGNOSIS — R519 Headache, unspecified: Secondary | ICD-10-CM

## 2015-12-18 LAB — COMPLETE METABOLIC PANEL WITH GFR
ALT: 11 U/L (ref 6–29)
AST: 15 U/L (ref 10–35)
Albumin: 4.2 g/dL (ref 3.6–5.1)
Alkaline Phosphatase: 73 U/L (ref 33–130)
BILIRUBIN TOTAL: 0.3 mg/dL (ref 0.2–1.2)
BUN: 17 mg/dL (ref 7–25)
CALCIUM: 9.5 mg/dL (ref 8.6–10.4)
CHLORIDE: 104 mmol/L (ref 98–110)
CO2: 26 mmol/L (ref 20–31)
Creat: 0.89 mg/dL (ref 0.50–0.99)
GFR, EST AFRICAN AMERICAN: 81 mL/min (ref >=60)
GFR, EST NON AFRICAN AMERICAN: 71 mL/min (ref >=60)
Glucose, Bld: 90 mg/dL (ref 70–99)
Potassium: 4.6 mmol/L (ref 3.5–5.3)
Sodium: 141 mmol/L (ref 135–146)
Total Protein: 7.5 g/dL (ref 6.1–8.1)

## 2015-12-18 LAB — HEMOGLOBIN A1C
Hgb A1c MFr Bld: 5.7 % — ABNORMAL HIGH (ref <5.7)
Mean Plasma Glucose: 117 mg/dL — ABNORMAL HIGH (ref <117)

## 2015-12-18 LAB — LIPID PANEL
CHOLESTEROL: 265 mg/dL — AB (ref 125–200)
HDL: 63 mg/dL (ref >=46)
LDL CALC: 163 mg/dL — AB (ref <130)
TRIGLYCERIDES: 196 mg/dL — AB (ref <150)
Total CHOL/HDL Ratio: 4.2 Ratio (ref <=5.0)
VLDL: 39 mg/dL — AB (ref <30)

## 2015-12-18 MED ORDER — ZOLPIDEM TARTRATE ER 12.5 MG PO TBCR: 12.5000 mg | EXTENDED_RELEASE_TABLET | Freq: Every day | ORAL | Status: DC

## 2015-12-18 MED ORDER — LORAZEPAM 1 MG PO TABS: 1.0000 mg | ORAL_TABLET | Freq: Three times a day (TID) | ORAL | Status: DC

## 2015-12-18 MED ORDER — BUTALBITAL-APAP-CAFFEINE 50-300-40 MG PO CAPS: ORAL_CAPSULE | ORAL | Status: DC

## 2015-12-18 MED ORDER — MECLIZINE HCL 25 MG PO TABS: 25.0000 mg | ORAL_TABLET | ORAL | Status: DC | PRN

## 2015-12-18 MED ORDER — PROMETHAZINE HCL 25 MG PO TABS: 25.0000 mg | ORAL_TABLET | Freq: Four times a day (QID) | ORAL | Status: DC | PRN

## 2015-12-18 MED ORDER — FLUCONAZOLE 150 MG PO TABS: 150.0000 mg | ORAL_TABLET | ORAL | Status: DC | PRN

## 2015-12-18 NOTE — Progress Notes (Addendum)
Patient ID: Peggy Fitzgerald MRN: [id number], DOB: Dec 14, 1955, 60 y.o. Date of Encounter: @DATE @  Chief Complaint:  Chief Complaint  Patient presents with  . new pt est care    working on refill of psych med and getting [facility] provider  . Medication Refill    has trintellix and Rexulti filled by [facility] provider, has list of what she needs refilled    HPI: 60 y.o. year old white female  presents as a new patient to establish care.  Her husband is being seen as a new patient today to establish care as well. I met him in the past when he has been here with his mother for appointments. His mother recently became a new patient here as well.  Patient was originally from this area.  She and her husband have been living in this area until about 10 years ago they moved to [place]. They just recently moved back here to this area. She says they moved here September 30.  She also tells me that she and her husband were separated for 10 years but has been back together for 5 years. She says that the mother-in-law just started living with them and this is really causing stress to patient.  When patient completed new patient paperwork and we reviewed her medications she was informed that we WILL NOT [place].  Today she reports that she actually had an attempted suicide in 2004. She reports that she has had no recent suicidal or homicidal ideation. She reports that she is in the process of finding a psychiatrist here. ADDENDUM ADDED 12/20/2015-----When nurse called pt with lab results today. Pt informed her that she has "found a [facility] provider and is seeing them today. [name] at Dr. Arvil Persons office."  She says that she uses Fioricet for headache. Says that she uses Phenergan for the nausea that accompanies headache.  She has no active complaints or concerns today. Just is here to get her other medications refilled and establish care.   Past Medical History  Diagnosis Date  .  Allergy     seasonal  . Anxiety   . Asthma     asthmatic bronchitis  . Cataract 2007  . Depression   . Prediabetes   . GERD (gastroesophageal reflux disease)   . Hyperlipidemia   . Hypertension   . Herpes simplex type 2 infection      Home Meds: Outpatient Prescriptions Prior to Visit  Medication Sig Dispense Refill  . acyclovir cream (ZOVIRAX) 5 % Apply 1 application topically as needed.    . Albuterol (PROVENTIL IN) Inhale into the lungs. With aerochamber    . atorvastatin (LIPITOR) 20 MG tablet Take 20 mg by mouth at bedtime.    . Biotin (BIOTIN MAXIMUM STRENGTH) 10 MG TABS Take 1 tablet by mouth daily.    . Brexpiprazole (REXULTI) 1 MG TABS Take 1 tablet by mouth daily.    . celecoxib (CELEBREX) 200 MG capsule Take 200 mg by mouth as needed.    . Cholecalciferol (VITAMIN D3) 5000 units CAPS Take 1 capsule by mouth daily.    . clobetasol cream (TEMOVATE) 1.27 % Apply 1 application topically as needed.    . Clobetasol Propionate 0.05 % lotion Apply topically 3 (three) times daily as needed.    . diphenoxylate-atropine (LOMOTIL) 2.5-0.025 MG tablet Take 1 tablet by mouth as needed for diarrhea or loose stools.    Marland Kitchen esomeprazole (NEXIUM) 40 MG capsule Take 40 mg by mouth daily  at 12 noon.    . Flaxseed, Linseed, (FLAXSEED OIL MAX STR) 1300 MG CAPS Take 1 capsule by mouth 2 (two) times daily.    Marland Kitchen HYDROcodone-homatropine (HYCODAN) 5-1.5 MG/5ML syrup Take 5 mLs by mouth every 6 (six) hours as needed for cough. Reported on 12/18/2015    . hydrOXYzine (ATARAX/VISTARIL) 25 MG tablet Take 25 mg by mouth 3 (three) times daily as needed.    . Magnesium 400 MG TABS Take 1 tablet by mouth daily.    . metoprolol succinate (TOPROL-XL) 100 MG 24 hr tablet Take 100 mg by mouth at bedtime. Take with or immediately following a meal.    . mometasone (NASONEX) 50 MCG/ACT nasal spray Place 2 sprays into the nose daily.    . Multiple Vitamins-Minerals (ONE DAILY MULTIVITAMIN WOMEN PO) Take 1 tablet by  mouth daily.    . mupirocin cream (BACTROBAN) 2 % Apply 1 application topically as needed.    . Ospemifene (OSPHENA) 60 MG TABS Take 1 tablet by mouth at bedtime.    . topiramate (TOPAMAX) 50 MG tablet Take 150 mg by mouth at bedtime.    . valACYclovir (VALTREX) 500 MG tablet Take 500 mg by mouth daily.    . Vortioxetine HBr (TRINTELLIX) 20 MG TABS Take 20 mg by mouth daily.    . Butalbital-APAP-Caffeine (FIORICET) 50-300-40 MG CAPS Take by mouth. 1-2 tabs Q4hr prn, no more then 6/day    . fluconazole (DIFLUCAN) 150 MG tablet Take 150 mg by mouth as needed.    Marland Kitchen LORazepam (ATIVAN) 1 MG tablet Take 1 mg by mouth 3 (three) times daily.    . meclizine (ANTIVERT) 25 MG tablet Take 25 mg by mouth as needed for dizziness.    . promethazine (PHENERGAN) 25 MG tablet Take 25 mg by mouth every 6 (six) hours as needed for nausea or vomiting.    Marland Kitchen zolpidem (AMBIEN CR) 12.5 MG CR tablet Take 12.5 mg by mouth at bedtime.     No facility-administered medications prior to visit.    Allergies:  Allergies  Allergen Reactions  . Oxycodone-Acetaminophen     REACTION: itches    Social History   Social History  . Marital Status: Married    Spouse Name: N/A  . Number of Children: N/A  . Years of Education: N/A   Occupational History  . Not on file.   Social History Main Topics  . Smoking status: Never Smoker   . Smokeless tobacco: Never Used  . Alcohol Use: No  . Drug Use: No  . Sexual Activity: Yes    Birth Control/ Protection: Surgical     Comment: spouse vasectomy   Other Topics Concern  . Not on file   Social History Narrative    Family History  Problem Relation Age of Onset  . Arthritis Mother   . Asthma Mother   . Hearing loss Mother   . Hyperlipidemia Mother   . Hypertension Mother   . Miscarriages / [place] Mother   . Stroke Mother   . Hearing loss Father   . Hypertension Father   . Stroke Father   . Cancer Brother     leukemia  . Early death Brother   . Arthritis  Maternal Grandmother   . Depression Maternal Grandmother   . Diabetes Maternal Grandmother   . Heart disease Maternal Grandmother     CHF  . Miscarriages / Stillbirths Maternal Grandmother   . Vision loss Maternal Grandmother   . Alcohol abuse Maternal Grandfather   .  Arthritis Paternal Grandmother   . Heart disease Paternal Grandmother     massive heart attack  . Miscarriages / Stillbirths Paternal Grandmother   . Alcohol abuse Paternal Grandfather   . Stroke Paternal Grandfather   . Heart disease Paternal Grandfather     arteriosclerosis     Review of Systems:  See HPI for pertinent ROS. All other ROS negative.    Physical Exam: Blood pressure 152/90, pulse 64, temperature 98.5 F (36.9 C), temperature source Oral, resp. rate 18, height 5' 5.25" (1.657 m), weight 175 lb (79.379 kg)., Body mass index is 28.91 kg/(m^2). General: WNWD WF. Appears in no acute distress. Neck: Supple. No thyromegaly. No lymphadenopathy. No carotid bruits. Lungs: Clear bilaterally to auscultation without wheezes, rales, or rhonchi. Breathing is unlabored. Heart: RRR with S1 S2. No murmurs, rubs, or gallops. Abdomen: Soft, non-tender, non-distended with normoactive bowel sounds. No hepatomegaly. No rebound/guarding. No obvious abdominal masses. Musculoskeletal:  Strength and tone normal for age. Extremities/Skin: Warm and dry.  No edema.  Neuro: Alert and oriented X 3. Moves all extremities spontaneously. Gait is normal. CNII-XII grossly in tact. Psych:  Responds to questions appropriately with a normal affect.     ASSESSMENT AND PLAN:  60 y.o. year old female with  1. Encounter to establish care with new doctor  2. Anxiety Will go ahead and refill this one medicine today but then she needs to get all psych medications from psych once she gets established with them. - LORazepam (ATIVAN) 1 MG tablet; Take 1 tablet (1 mg total) by mouth 3 (three) times daily.  Dispense: 90 tablet; Refill:  0 ADDENDUM ADDED 12/20/2015-----When nurse called pt with lab results today. Pt informed her that she has "found a [facility] provider and is seeing them today. [name] at Dr. Arvil Persons office."   3. Asthma, mild intermittent, uncomplicated Stable/controlled  4. Depression This is stable/controlled. [name] in the process of establishing with a psychiatrist locally. ADDENDUM ADDED 12/20/2015-----When nurse called pt with lab results today. Pt informed her that she has "found a [facility] provider and is seeing them today. [name] at Dr. Arvil Persons office."   5. Prediabetes Check labs to monitor this. - COMPLETE METABOLIC PANEL WITH GFR - Hemoglobin A1c  6. Gastroesophageal reflux disease, esophagitis presence not specified Symptoms are controlled on current PPI.  7. Hyperlipidemia [name] on statin. Check lab to monitor. Says that she had 1 sip of soda but otherwise is fasting. - COMPLETE METABOLIC PANEL WITH GFR - Lipid panel  8. Essential hypertension Blood pressure is a little elevated today but she is here as a new patient will wait to monitor prior to adjusting medications. Check lab to monitor current medications. - COMPLETE METABOLIC PANEL WITH GFR  9. Herpes simplex type 2 infection [name] on Valtrex for this.  10. Arthralgia of shoulder, unspecified laterality  11. Insomnia This is controlled with use of Ambien - zolpidem (AMBIEN CR) 12.5 MG CR tablet; Take 1 tablet (12.5 mg total) by mouth at bedtime.  Dispense: 30 tablet; Refill: 2 ADDENDUM ADDED 12/20/2015-----When nurse called pt with lab results today. Pt informed her that she has "found a [facility] provider and is seeing them today. [name] at Dr. Arvil Persons office."   71. Nonintractable headache, unspecified chronicity pattern, unspecified headache type [name] requesting refills on her Fioricet and Phenergan. - Butalbital-APAP-Caffeine (FIORICET) 50-300-40 MG CAPS; 1-2 tabs Q4hr prn, no more then 6/day  Dispense: 30  capsule; Refill: 0 - promethazine (PHENERGAN) 25  MG tablet; Take 1 tablet (25 mg total) by mouth every 6 (six) hours as needed for nausea or vomiting.  Dispense: 30 tablet; Refill: 1  13. Vertigo Is requesting refill on her meclizine. Says is that she uses this as needed for dizziness/vertigo. - meclizine (ANTIVERT) 25 MG tablet; Take 1 tablet (25 mg total) by mouth as needed for dizziness.  Dispense: 30 tablet; Refill: 1  Will check labs now. Will have her schedule a follow-up office visit in one month to further evaluate. Will obtain records to f/u records as well.  ADDENDUM ADDED 12/20/2015-----When nurse called pt with lab results today. Pt informed her that she has "found a [facility] provider and is seeing them today. [name] at Dr. Arvil Persons office."   Signed, [facility], [place], [place], [facility] 12/18/2015 11:46 AM

## 2015-12-27 ENCOUNTER — Ambulatory Visit: Payer: Self-pay | Admitting: Physician Assistant

## 2016-01-01 ENCOUNTER — Ambulatory Visit: Payer: TRICARE For Life (TFL) | Admitting: Physician Assistant

## 2016-01-01 ENCOUNTER — Telehealth: Payer: Self-pay | Admitting: Physician Assistant

## 2016-01-05 ENCOUNTER — Other Ambulatory Visit: Payer: Self-pay | Admitting: Family Medicine

## 2016-01-05 DIAGNOSIS — R42 Dizziness and giddiness: Secondary | ICD-10-CM

## 2016-01-05 DIAGNOSIS — G4700 Insomnia, unspecified: Secondary | ICD-10-CM

## 2016-01-05 DIAGNOSIS — F419 Anxiety disorder, unspecified: Secondary | ICD-10-CM

## 2016-01-05 NOTE — Telephone Encounter (Signed)
Pharmacy calling for patient.  Asking for 90 day refills for Lorazepam, Meclizine and Zolpidem.

## 2016-01-08 MED ORDER — LORAZEPAM 1 MG PO TABS: 1.0000 mg | ORAL_TABLET | Freq: Three times a day (TID) | ORAL | Status: DC | PRN

## 2016-01-08 MED ORDER — ZOLPIDEM TARTRATE ER 12.5 MG PO TBCR: 12.5000 mg | EXTENDED_RELEASE_TABLET | Freq: Every day | ORAL | Status: DC

## 2016-01-08 MED ORDER — MECLIZINE HCL 25 MG PO TABS: 25.0000 mg | ORAL_TABLET | ORAL | Status: DC | PRN

## 2016-01-08 NOTE — Telephone Encounter (Signed)
Refills faxed to pharmacy.

## 2016-01-08 NOTE — Telephone Encounter (Signed)
The Ativan is for 3 times daily. I do not feel comfortable dispensing #270 of Ativan--this needs to stay at # 90. Each of the Other medications can be dispensed for #90+0

## 2016-01-09 ENCOUNTER — Other Ambulatory Visit: Payer: Self-pay | Admitting: Physician Assistant

## 2016-01-10 ENCOUNTER — Other Ambulatory Visit: Payer: Self-pay | Admitting: Physician Assistant

## 2016-01-10 NOTE — Telephone Encounter (Signed)
LRF 12/18/15 #30  LOV 12/18/15  OK refill?

## 2016-01-10 NOTE — Telephone Encounter (Signed)
rx called in

## 2016-01-10 NOTE — Telephone Encounter (Signed)
Approved. #30+2. 

## 2016-01-15 ENCOUNTER — Other Ambulatory Visit: Payer: Self-pay | Admitting: Family Medicine

## 2016-01-15 DIAGNOSIS — R519 Headache, unspecified: Secondary | ICD-10-CM

## 2016-01-15 DIAGNOSIS — R51 Headache: Secondary | ICD-10-CM

## 2016-01-15 DIAGNOSIS — G4700 Insomnia, unspecified: Secondary | ICD-10-CM

## 2016-01-15 DIAGNOSIS — R42 Dizziness and giddiness: Secondary | ICD-10-CM

## 2016-01-15 MED ORDER — MECLIZINE HCL 25 MG PO TABS: 25.0000 mg | ORAL_TABLET | ORAL | Status: DC | PRN

## 2016-01-15 MED ORDER — VALACYCLOVIR HCL 500 MG PO TABS: 500.0000 mg | ORAL_TABLET | Freq: Every day | ORAL | Status: DC

## 2016-01-15 MED ORDER — ATORVASTATIN CALCIUM 20 MG PO TABS: 20.0000 mg | ORAL_TABLET | Freq: Every day | ORAL | Status: DC

## 2016-01-15 MED ORDER — BUTALBITAL-APAP-CAFFEINE 50-300-40 MG PO CAPS: ORAL_CAPSULE | ORAL | Status: DC

## 2016-01-15 MED ORDER — OSPEMIFENE 60 MG PO TABS: 1.0000 | ORAL_TABLET | Freq: Every day | ORAL | Status: DC

## 2016-01-15 MED ORDER — CELECOXIB 200 MG PO CAPS: 200.0000 mg | ORAL_CAPSULE | ORAL | Status: DC | PRN

## 2016-01-15 MED ORDER — ZOLPIDEM TARTRATE ER 12.5 MG PO TBCR: 12.5000 mg | EXTENDED_RELEASE_TABLET | Freq: Every day | ORAL | Status: DC

## 2016-01-15 MED ORDER — METOPROLOL SUCCINATE ER 100 MG PO TB24: 100.0000 mg | ORAL_TABLET | Freq: Every day | ORAL | Status: DC

## 2016-01-15 MED ORDER — PROMETHAZINE HCL 25 MG PO TABS: 25.0000 mg | ORAL_TABLET | Freq: Four times a day (QID) | ORAL | Status: DC | PRN

## 2016-01-15 NOTE — Telephone Encounter (Signed)
meds refilled[facility]

## 2016-01-15 NOTE — Telephone Encounter (Signed)
90 + 0 on all except the zolpidem can give 90 + 2.

## 2016-01-15 NOTE — Telephone Encounter (Signed)
Next OV 03/18/16.  She want all these meds 90 day from mail order.  OK Butalbital, meclizine, phenergan, zolpidem?

## 2016-01-16 ENCOUNTER — Other Ambulatory Visit: Payer: Self-pay | Admitting: Family Medicine

## 2016-01-16 MED ORDER — METOPROLOL SUCCINATE ER 100 MG PO TB24: 100.0000 mg | ORAL_TABLET | Freq: Every day | ORAL | Status: DC

## 2016-01-16 NOTE — Telephone Encounter (Signed)
Medication refilled per protocol. 

## 2016-01-29 ENCOUNTER — Encounter: Payer: Self-pay | Admitting: Family Medicine

## 2016-01-29 ENCOUNTER — Ambulatory Visit (INDEPENDENT_AMBULATORY_CARE_PROVIDER_SITE_OTHER): Payer: TRICARE For Life (TFL) | Admitting: Family Medicine

## 2016-01-29 ENCOUNTER — Other Ambulatory Visit: Payer: Self-pay | Admitting: Family Medicine

## 2016-01-29 VITALS — BP 120/78 | HR 88 | Temp 98.8°F | Resp 16 | Wt 188.0 lb

## 2016-01-29 DIAGNOSIS — M25561 Pain in right knee: Secondary | ICD-10-CM

## 2016-01-29 DIAGNOSIS — Z1159 Encounter for screening for other viral diseases: Secondary | ICD-10-CM

## 2016-01-29 DIAGNOSIS — E669 Obesity, unspecified: Secondary | ICD-10-CM

## 2016-01-29 LAB — HEPATITIS C ANTIBODY: HCV AB: NEGATIVE

## 2016-01-29 MED ORDER — PHENTERMINE HCL 37.5 MG PO CAPS: 37.5000 mg | ORAL_CAPSULE | ORAL | Status: DC

## 2016-01-29 NOTE — Progress Notes (Signed)
Subjective:    Patient ID: Peggy Fitzgerald, female    DOB: 24-Feb-1956, 60 y.o.   MRN: [id number]  HPI Patient has 3 concerns. #1 she would like to be tested for hepatitis C.  #2 she would like a medication to assist with weight loss. She has a history of prediabetes. In the past she is taking Adipex without difficulty.  [name] interested in Auxier but after discussing the price, she declined.  She denies any chest pain shortness of breath or dyspnea on exertion. Her blood pressures well controlled at 120/78. She also complains of pain in her right knee 1 week. The pain is located over the posterior lateral joint line. She also reports instability in the knee joint. She denies any specific injury. She has no laxity to varus or valgus stress. She has a negative anterior posterior drawer sign. She has no tenderness to palpation over the joint line. She does have a positive Apley grind.  Past Medical History  Diagnosis Date  . Allergy     seasonal  . Anxiety   . Asthma     asthmatic bronchitis  . Cataract 2007  . Depression   . Prediabetes   . GERD (gastroesophageal reflux disease)   . Hyperlipidemia   . Hypertension   . Herpes simplex type 2 infection    Past Surgical History  Procedure Laterality Date  . Cataract extraction, bilateral    . Cholecystectomy  1987   Current Outpatient Prescriptions on File Prior to Visit  Medication Sig Dispense Refill  . acyclovir cream (ZOVIRAX) 5 % Apply 1 application topically as needed.    . Albuterol (PROVENTIL IN) Inhale into the lungs. With aerochamber    . atorvastatin (LIPITOR) 20 MG tablet Take 1 tablet (20 mg total) by mouth at bedtime. 90 tablet 1  . Biotin (BIOTIN MAXIMUM STRENGTH) 10 MG TABS Take 1 tablet by mouth daily.    . Brexpiprazole (REXULTI) 1 MG TABS Take 1 tablet by mouth daily.    . Butalbital-APAP-Caffeine 50-300-40 MG CAPS TAKE ONE TO TWO CAPSULES BY MOUTH EVERY 4 HOURS AS NEEDED (  NO  MORE  THAN  6  A  DAY) 90  capsule 0  . celecoxib (CELEBREX) 200 MG capsule Take 1 capsule (200 mg total) by mouth as needed. 90 capsule 1  . Cholecalciferol (VITAMIN D3) 5000 units CAPS Take 1 capsule by mouth daily.    . clobetasol cream (TEMOVATE) 0.05 % Apply 1 application topically as needed.    . Clobetasol Propionate 0.05 % lotion Apply topically 3 (three) times daily as needed.    . diphenoxylate-atropine (LOMOTIL) 2.5-0.025 MG tablet Take 1 tablet by mouth as needed for diarrhea or loose stools.    Marland Kitchen esomeprazole (NEXIUM) 40 MG capsule Take 40 mg by mouth daily at 12 noon.    . Flaxseed, Linseed, (FLAXSEED OIL MAX STR) 1300 MG CAPS Take 1 capsule by mouth 2 (two) times daily.    . fluconazole (DIFLUCAN) 150 MG tablet Take 1 tablet (150 mg total) by mouth as needed. 1 tablet 2  . HYDROcodone-homatropine (HYCODAN) 5-1.5 MG/5ML syrup Take 5 mLs by mouth every 6 (six) hours as needed for cough. Reported on 12/18/2015    . hydrOXYzine (ATARAX/VISTARIL) 25 MG tablet Take 25 mg by mouth 3 (three) times daily as needed.    Marland Kitchen LORazepam (ATIVAN) 1 MG tablet Take 1 tablet (1 mg total) by mouth 3 (three) times daily as needed for anxiety. 90 tablet 2  .  Magnesium 400 MG TABS Take 1 tablet by mouth daily.    . meclizine (ANTIVERT) 25 MG tablet Take 1 tablet (25 mg total) by mouth as needed for dizziness. 90 tablet 0  . metoprolol succinate (TOPROL-XL) 100 MG 24 hr tablet Take 1 tablet (100 mg total) by mouth at bedtime. 90 tablet 1  . mometasone (NASONEX) 50 MCG/ACT nasal spray Place 2 sprays into the nose daily.    . Multiple Vitamins-Minerals (ONE DAILY MULTIVITAMIN WOMEN PO) Take 1 tablet by mouth daily.    . mupirocin cream (BACTROBAN) 2 % Apply 1 application topically as needed.    . Ospemifene (OSPHENA) 60 MG TABS Take 1 tablet by mouth at bedtime. 90 tablet 1  . promethazine (PHENERGAN) 25 MG tablet Take 1 tablet (25 mg total) by mouth every 6 (six) hours as needed for nausea or vomiting. 90 tablet 0  . topiramate  (TOPAMAX) 50 MG tablet Take 150 mg by mouth at bedtime.    . valACYclovir (VALTREX) 500 MG tablet Take 1 tablet (500 mg total) by mouth daily. 90 tablet 1  . Vortioxetine HBr (TRINTELLIX) 20 MG TABS Take 20 mg by mouth daily.    Marland Kitchen zolpidem (AMBIEN CR) 12.5 MG CR tablet Take 1 tablet (12.5 mg total) by mouth at bedtime. 90 tablet 2   No current facility-administered medications on file prior to visit.   Allergies  Allergen Reactions  . Oxycodone-Acetaminophen     REACTION: itches   Social History   Social History  . Marital Status: Married    Spouse Name: N/A  . Number of Children: N/A  . Years of Education: N/A   Occupational History  . Not on file.   Social History Main Topics  . Smoking status: Never Smoker   . Smokeless tobacco: Never Used  . Alcohol Use: No  . Drug Use: No  . Sexual Activity: Yes    Birth Control/ Protection: Surgical     Comment: spouse vasectomy   Other Topics Concern  . Not on file   Social History Narrative      Review of Systems  All other systems reviewed and are negative.      Objective:   Physical Exam  Cardiovascular: Normal rate, regular rhythm and normal heart sounds.   Pulmonary/Chest: Effort normal and breath sounds normal. No respiratory distress. She has no wheezes. She has no rales.  Musculoskeletal:       Right knee: She exhibits abnormal meniscus. She exhibits normal range of motion, no swelling, no effusion, no LCL laxity and no MCL laxity. No medial joint line, no lateral joint line, no MCL and no LCL tenderness noted.  Vitals reviewed.         Assessment & Plan:  Need for hepatitis C screening test - Plan: Hepatitis C Ab Reflex HCV RNA, QUANT  Right knee pain  Obesity  I suspect meniscal tear versus arthritis. Using sterile technique, I injected the right knee with a mixture of 2 mL of lidocaine, 2 mL of Marcaine, and 2 mL of 40 mg per mL Kenalog. I will screen the patient has requested for hepatitis C. I will  also give her a 90 day supply of Adipex 37.5 mg 1 by mouth every morning to assist in weight loss in addition to diet exercise and lifestyle changes.

## 2016-02-07 ENCOUNTER — Encounter: Payer: Self-pay | Admitting: Family Medicine

## 2016-02-16 ENCOUNTER — Telehealth: Payer: Self-pay | Admitting: Physician Assistant

## 2016-02-16 DIAGNOSIS — M25561 Pain in right knee: Secondary | ICD-10-CM

## 2016-02-16 NOTE — Telephone Encounter (Signed)
Would proceed with mri of affected knee.

## 2016-02-16 NOTE — Telephone Encounter (Signed)
Pt called to let Dr. Tanya NonesPickard know that she is still having trouble with her knee even after her Cortizone shot.  Please advise. [phone]

## 2016-02-16 NOTE — Telephone Encounter (Signed)
Pt called and MRI ordered

## 2016-03-01 ENCOUNTER — Other Ambulatory Visit: Payer: Self-pay | Admitting: Physician Assistant

## 2016-03-01 NOTE — Telephone Encounter (Signed)
Ok to refill 

## 2016-03-01 NOTE — Telephone Encounter (Signed)
Each approved for # 90 + 0.

## 2016-03-01 NOTE — Telephone Encounter (Signed)
rx printed for signature and then will fax mail order

## 2016-03-04 ENCOUNTER — Other Ambulatory Visit: Payer: Self-pay | Admitting: Family Medicine

## 2016-03-04 DIAGNOSIS — R42 Dizziness and giddiness: Secondary | ICD-10-CM

## 2016-03-04 NOTE — Telephone Encounter (Signed)
Wants 90 day refills to mail order for Meclizine and Hydroxyzine  Meclizine was sent #90 on 01/15/16  No refill history for Hydroxyzine

## 2016-03-05 ENCOUNTER — Ambulatory Visit (HOSPITAL_COMMUNITY)

## 2016-03-06 NOTE — Telephone Encounter (Signed)
Find out why she takes Meclizine on regular basis.  Is if for vertigo symptoms? If so, has she seen specialists about this in past?  If so, what type? Neuro? ENT?

## 2016-03-07 NOTE — Telephone Encounter (Signed)
Have her schedule f/u OV so I can have further evaluation and  Documentation. She has only had one OV to establish care.  May fill each med once with no further refills to hold her over until f/u OV with me.

## 2016-03-07 NOTE — Telephone Encounter (Signed)
Pt called and asked to make appt to discuss all these PRN meds.  Has appt for 03/18/16.

## 2016-03-11 ENCOUNTER — Ambulatory Visit (HOSPITAL_COMMUNITY)
Admission: RE | Admit: 2016-03-11 | Discharge: 2016-03-11 | Disposition: A | Discharge status: Home / Self Care | Source: Ambulatory Visit | Attending: Family Medicine | Admitting: Family Medicine

## 2016-03-11 DIAGNOSIS — S83281A Other tear of lateral meniscus, current injury, right knee, initial encounter: Secondary | ICD-10-CM | POA: Insufficient documentation

## 2016-03-11 DIAGNOSIS — X58XXXA Exposure to other specified factors, initial encounter: Secondary | ICD-10-CM | POA: Insufficient documentation

## 2016-03-11 DIAGNOSIS — M25561 Pain in right knee: Secondary | ICD-10-CM | POA: Diagnosis present

## 2016-03-12 ENCOUNTER — Other Ambulatory Visit: Payer: Self-pay | Admitting: Family Medicine

## 2016-03-12 DIAGNOSIS — R936 Abnormal findings on diagnostic imaging of limbs: Secondary | ICD-10-CM

## 2016-03-13 ENCOUNTER — Other Ambulatory Visit: Payer: Self-pay | Admitting: Physician Assistant

## 2016-03-13 NOTE — Telephone Encounter (Signed)
Pt wants to know if Dr. Tanya NonesPickard can call in a prescription for something to help with her knee pain.  [facility] [phone]

## 2016-03-14 MED ORDER — HYDROCODONE-ACETAMINOPHEN 5-325 MG PO TABS: 1.0000 | ORAL_TABLET | Freq: Four times a day (QID) | ORAL | Status: DC | PRN

## 2016-03-14 NOTE — Telephone Encounter (Signed)
norco 5/325 po q 6 hrs prn pain (30)

## 2016-03-14 NOTE — Telephone Encounter (Signed)
Pt called and told Rx is ready for pick-up

## 2016-03-18 ENCOUNTER — Ambulatory Visit (INDEPENDENT_AMBULATORY_CARE_PROVIDER_SITE_OTHER): Payer: TRICARE For Life (TFL) | Admitting: Physician Assistant

## 2016-03-18 ENCOUNTER — Encounter: Payer: Self-pay | Admitting: Physician Assistant

## 2016-03-18 VITALS — BP 138/86 | HR 84 | Temp 98.5°F | Resp 18 | Wt 190.0 lb

## 2016-03-18 DIAGNOSIS — R51 Headache: Secondary | ICD-10-CM | POA: Diagnosis not present

## 2016-03-18 DIAGNOSIS — J4520 Mild intermittent asthma, uncomplicated: Secondary | ICD-10-CM | POA: Diagnosis not present

## 2016-03-18 DIAGNOSIS — I10 Essential (primary) hypertension: Secondary | ICD-10-CM | POA: Diagnosis not present

## 2016-03-18 DIAGNOSIS — K219 Gastro-esophageal reflux disease without esophagitis: Secondary | ICD-10-CM | POA: Diagnosis not present

## 2016-03-18 DIAGNOSIS — F329 Major depressive disorder, single episode, unspecified: Secondary | ICD-10-CM

## 2016-03-18 DIAGNOSIS — G4700 Insomnia, unspecified: Secondary | ICD-10-CM

## 2016-03-18 DIAGNOSIS — R7303 Prediabetes: Secondary | ICD-10-CM | POA: Diagnosis not present

## 2016-03-18 DIAGNOSIS — R42 Dizziness and giddiness: Secondary | ICD-10-CM | POA: Diagnosis not present

## 2016-03-18 DIAGNOSIS — F419 Anxiety disorder, unspecified: Secondary | ICD-10-CM | POA: Diagnosis not present

## 2016-03-18 DIAGNOSIS — E785 Hyperlipidemia, unspecified: Secondary | ICD-10-CM | POA: Diagnosis not present

## 2016-03-18 DIAGNOSIS — F32A Depression, unspecified: Secondary | ICD-10-CM

## 2016-03-18 DIAGNOSIS — B009 Herpesviral infection, unspecified: Secondary | ICD-10-CM | POA: Diagnosis not present

## 2016-03-18 DIAGNOSIS — R519 Headache, unspecified: Secondary | ICD-10-CM

## 2016-03-18 NOTE — Progress Notes (Addendum)
Patient ID: Peggy Fitzgerald MRN: [id number], DOB: September 02, 1956, 60 y.o. Date of Encounter: _0 @  Chief Complaint:  Chief Complaint  Patient presents with  . routine follow up    discuss use of PRN's and refills to mail order, herpes outbreak , ? due for CPE,  different sleep pill,     HPI: 60 y.o. year old white female  Presents for routine f/u OV.  12/18/2015 OV: Presents as a new patient to establish care.  Her husband is being seen as a new patient today to establish care as well. I met him in the past when he has been here with his mother for appointments. His mother recently became a new patient here as well.  Patient was originally from this area.  She and her husband have been living in this area until about 10 years ago they moved to [place]. They just recently moved back here to this area. She says they moved here September 30.  She also tells me that she and her husband were separated for 10 years but has been back together for 5 years. She says that the mother-in-law just started living with them and this is really causing stress to patient.  When patient completed new patient paperwork and we reviewed her medications she was informed that we WILL NOT [place].  Today she reports that she actually had an attempted suicide in 2004. She reports that she has had no recent suicidal or homicidal ideation. She reports that she is in the process of finding a psychiatrist here. ADDENDUM ADDED 12/20/2015-----When nurse called pt with lab results today. Pt informed her that she has "found a [facility] provider and is seeing them today. Peggy Fitzgerald at Dr. Arvil Persons office."  She says that she uses Fioricet for headache. Says that she uses Phenergan for the nausea that accompanies headache.  She has no active complaints or concerns today. Just is here to get her other medications refilled and establish care.   OV 03/18/2016: She states that her right foot plantar  fasciitis is "acting up". --Says that she has had cortisone injections in in the past --Asked if she uses any other men such as arch support or heel cups. She does not and I recommended this.  Also recently we have been getting refill request on her PRN meds which been high quantity and multiple PRN meds.  Asked about the butalbital. She says that she uses these as needed for migraines. However says that many things trigger migraines including changes in weather and barometer changes "and any fragrance in perfumes hair spray etc. Says because she gets them so frequently and this is all that works she needs to have these on hand at all times. Says that she never abuses them and never takes more than 6 per day. Says that she has even used hydrocodone 5/325 and 10/325 and this does not even work for her migraine.  I then asked about the meclizine she says that she uses this for vertigo and has to have this on hand at all times because this also "hits anytime "anytime that she "turns her head the wrong way it hits " Asked if she has had any Apley maneuvers and she says that she did they were a very long time ago. Will refer her to ENT for Apley maneuvers.  She says that she has to use the hydroxyzine and clobetasol for itching that is secondary to stress. When she was in the hospital in  the past that he explained to her that her body can take certain amount of stress and then when stressed exceeds that amount the body can take a little bit more but then if stress continues higher than the body starts to show it and physical ways such as these stress bumps. Therefore she has to use the hydroxyzine in the clobetasol to her scalp for this.  Also she says that she has not been taking her metoprolol as directed for her blood pressure recently. I have told her to take that as directed.  Also says that in the past her cholesterol was high and she was told to take atorvastatin but she has not been taking this  recently. Today I have reviewed her lipid LFT panel that we did at her last visit here and I told her to take this as directed.  She also says that she thinks she may need to change her sleep medicine. Says that she is using Ambien but sometimes she is having thoughts in her mind even when she sleeps in his leg "doing work during the night while she sleeps ". Discussed with her today following up med for this with behavioral health. She says that she has an appointment with Peggy Fitzgerald who prescribes psych medications in one week. Says that she also sees a therapist there.  ADDENDUM--ADDED 04/01/2016---- Received OV note by Dr. Erik Obey at [facility], Nose, Throat. Note dated 03/28/2016.  Orthostatic pulses and blood pressures normal.  Audiometry was performed. Dix-Hallpike testing was negative.  Note Includes:  " Impression: Vertigo of uncertain etiology. The extended duration is not characteristic of inner ear vertigo. No asymmetric hearing. I wonder how much her psychiatric issues may be contributing. It does not sound like migraine related vertigo either. Plan: I would like her to see a neurologist. They may choose to do an MRI scan of her brain. Return visit here as needed."   ADDENDUM ADDED 11/04/2016:  RECEIVED OV NOTE FROM [facility]---DR. BEANE--DATED 10/29/2016: His note states that she was there for follow-up of her shoulder bilateral shoulder pain. Says that she was 5 months out from when the symptoms began and 5 weeks out from right shoulder cortisone injection.  He stated that symptoms were 50% better and reported that current pain level mild.  She had been using physical therapy and also using Ultram.  On exam she had significant improvement in range of motion and was near full range of motion. Slightly decreased internal rotation and forward flexion. Negative impingement sign.  He reviewed physical therapy notes.  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 Continue activity modification. She asked for 1 more prescription for tramadol. She can take up to twice a day. [name] not getting it from anywhere else and is not somnolent with this. No refills. She will convert to over-the-counter anti-inflammatories and/or Tylenol. Complete her physical therapy and then engage in a home exercise program. We will see her back on an as-needed basis. Fortunately this is much improved. He gave tramadol 50 mg 1 tablet twice daily when necessary severe pain #30 with no refill.  Past Medical History  Diagnosis Date  . Allergy     seasonal  . Anxiety   . Asthma     asthmatic bronchitis  . Cataract 2007  . Depression   . Prediabetes   . GERD (gastroesophageal reflux disease)   . Hyperlipidemia   . Hypertension   . Herpes simplex  type 2 infection      Home Meds: Outpatient Prescriptions Prior to Visit  Medication Sig Dispense Refill  . acyclovir cream (ZOVIRAX) 5 % Apply 1 application topically as needed.    . Albuterol (PROVENTIL IN) Inhale into the lungs. With aerochamber    . atorvastatin (LIPITOR) 20 MG tablet Take 1 tablet (20 mg total) by mouth at bedtime. 90 tablet 1  . Biotin (BIOTIN MAXIMUM STRENGTH) 10 MG TABS Take 1 tablet by mouth daily.    . Butalbital-APAP-Caffeine 50-300-40 MG CAPS TAKE 1 TO 2 CAPSULES EVERY 4 HOURS AS NEEDED (NO MORE THAN 6 A DAY) 90 capsule 0  . celecoxib (CELEBREX) 200 MG capsule Take 1 capsule (200 mg total) by mouth as needed. 90 capsule 1  . Cholecalciferol (VITAMIN D3) 5000 units CAPS Take 1 capsule by mouth daily.    . clobetasol cream (TEMOVATE) 8.18 % Apply 1 application topically as needed.    . Clobetasol Propionate 0.05 % lotion Apply topically 3 (three) times daily as needed.    . diphenoxylate-atropine (LOMOTIL) 2.5-0.025 MG tablet Take 1 tablet by mouth as needed for diarrhea or loose stools.    Marland Kitchen esomeprazole (NEXIUM) 40 MG capsule Take  40 mg by mouth daily at 12 noon.    . Flaxseed, Linseed, (FLAXSEED OIL MAX STR) 1300 MG CAPS Take 1 capsule by mouth 2 (two) times daily.    . fluconazole (DIFLUCAN) 150 MG tablet Take 1 tablet (150 mg total) by mouth as needed. 1 tablet 2  . HYDROcodone-acetaminophen (NORCO/VICODIN) 5-325 MG tablet Take 1 tablet by mouth every 6 (six) hours as needed for moderate pain. 30 tablet 0  . HYDROcodone-homatropine (HYCODAN) 5-1.5 MG/5ML syrup Take 5 mLs by mouth every 6 (six) hours as needed for cough. Reported on 12/18/2015    . hydrOXYzine (ATARAX/VISTARIL) 25 MG tablet Take 25 mg by mouth 3 (three) times daily as needed.    Marland Kitchen LORazepam (ATIVAN) 1 MG tablet Take 1 tablet (1 mg total) by mouth 3 (three) times daily as needed for anxiety. 90 tablet 2  . Magnesium 400 MG TABS Take 1 tablet by mouth daily.    . meclizine (ANTIVERT) 25 MG tablet Take 1 tablet (25 mg total) by mouth as needed for dizziness. 90 tablet 0  . metoprolol succinate (TOPROL-XL) 100 MG 24 hr tablet Take 1 tablet (100 mg total) by mouth at bedtime. 90 tablet 1  . mometasone (NASONEX) 50 MCG/ACT nasal spray Place 2 sprays into the nose daily.    . Multiple Vitamins-Minerals (ONE DAILY MULTIVITAMIN WOMEN PO) Take 1 tablet by mouth daily.    . mupirocin cream (BACTROBAN) 2 % Apply 1 application topically as needed.    . Ospemifene (OSPHENA) 60 MG TABS Take 1 tablet by mouth at bedtime. 90 tablet 1  . phentermine 37.5 MG capsule Take 1 capsule (37.5 mg total) by mouth every morning. 30 capsule 2  . promethazine (PHENERGAN) 25 MG tablet TAKE 1 TABLET EVERY 6 HOURS AS NEEDED FOR NAUSEA OR VOMITING 90 tablet 0  . topiramate (TOPAMAX) 50 MG tablet Take 150 mg by mouth at bedtime.    . valACYclovir (VALTREX) 500 MG tablet Take 1 tablet (500 mg total) by mouth daily. 90 tablet 1  . Vortioxetine HBr (TRINTELLIX) 20 MG TABS Take 20 mg by mouth daily.    Marland Kitchen zolpidem (AMBIEN CR) 12.5 MG CR tablet Take 1 tablet (12.5 mg total) by mouth at bedtime. 90  tablet 2  . Brexpiprazole (REXULTI) 1  MG TABS Take 1 tablet by mouth daily.     No facility-administered medications prior to visit.    Allergies:  Allergies  Allergen Reactions  . Oxycodone-Acetaminophen     REACTION: itches    Social History   Social History  . Marital Status: Married    Spouse Name: N/A  . Number of Children: N/A  . Years of Education: N/A   Occupational History  . Not on file.   Social History Main Topics  . Smoking status: Never Smoker   . Smokeless tobacco: Never Used  . Alcohol Use: No  . Drug Use: No  . Sexual Activity: Yes    Birth Control/ Protection: Surgical     Comment: spouse vasectomy   Other Topics Concern  . Not on file   Social History Narrative    Family History  Problem Relation Age of Onset  . Arthritis Mother   . Asthma Mother   . Hearing loss Mother   . Hyperlipidemia Mother   . Hypertension Mother   . Miscarriages / [place] Mother   . Stroke Mother   . Hearing loss Father   . Hypertension Father   . Stroke Father   . Cancer Brother     leukemia  . Early death Brother   . Arthritis Maternal Grandmother   . Depression Maternal Grandmother   . Diabetes Maternal Grandmother   . Heart disease Maternal Grandmother     CHF  . Miscarriages / Stillbirths Maternal Grandmother   . Vision loss Maternal Grandmother   . Alcohol abuse Maternal Grandfather   . Arthritis Paternal Grandmother   . Heart disease Paternal Grandmother     massive heart attack  . Miscarriages / Stillbirths Paternal Grandmother   . Alcohol abuse Paternal Grandfather   . Stroke Paternal Grandfather   . Heart disease Paternal Grandfather     arteriosclerosis     Review of Systems:  See HPI for pertinent ROS. All other ROS negative.    Physical Exam: Blood pressure 138/86, pulse 84, temperature 98.5 F (36.9 C), temperature source Oral, resp. rate 18, weight 190 lb (86.183 kg)., Body mass index is 31.39 kg/(m^2). General: WNWD WF. Appears  in no acute distress. Neck: Supple. No thyromegaly. No lymphadenopathy. No carotid bruits. Lungs: Clear bilaterally to auscultation without wheezes, rales, or rhonchi. Breathing is unlabored. Heart: RRR with S1 S2. No murmurs, rubs, or gallops. Abdomen: Soft, non-tender, non-distended with normoactive bowel sounds. No hepatomegaly. No rebound/guarding. No obvious abdominal masses. Musculoskeletal:  Strength and tone normal for age. Extremities/Skin: Warm and dry.  No edema.  Neuro: Alert and oriented X 3. Moves all extremities spontaneously. Gait is normal. CNII-XII grossly in tact. Psych:  Responds to questions appropriately with a normal affect.     ASSESSMENT AND PLAN:  60 y.o. year old female with    Vertigo At visit 03/18/16 I have placed a referral to ENT for Apley maneuvers. - Ambulatory referral to ENT ADDENDUM--ADDED 04/01/2016---- Received OV note by Dr. Erik Obey at [facility], Nose, Throat. Note dated 03/28/2016.  Orthostatic pulses and blood pressures normal.  Audiometry was performed. Dix-Hallpike testing was negative.  Note Includes:  " Impression: Vertigo of uncertain etiology. The extended duration is not characteristic of inner ear vertigo. No asymmetric hearing. I wonder how much her psychiatric issues may be contributing. It does not sound like migraine related vertigo either. Plan: I would like her to see a neurologist. They may choose to do an MRI scan of her brain.  Return visit here as needed."  -----I WILL F/U TO SEE IF SHE HAS F/U WITH NEURO--WILL MAKE SURE SHE HAS F/U WITH NEURO---IF THEIR EVAL IS NEGATIVE, THEN WILL INFORM PSYCH OF PATIENTS SYMPTOMS AND HIGH USE OF THESE MEDS AND MEDS FOR FREQUENT HEADACHES------------------  Anxiety Managed by psych  Asthma, mild intermittent, uncomplicated Stable/controlled  Depression Managed by psych  Prediabetes A1C was Fitzgerald at check 12/18/15.  Can wait to recheck.  Gastroesophageal reflux disease, esophagitis  presence not specified Symptoms are controlled on current PPI.  Hyperlipidemia Did FLP/LFT at visit 1/17. OV 4/17 she reports that she has not been taking her statin daily and I told her to take this daily as directed.  Essential hypertension Blood pressure is slightly elevated. She says that she has not been taking her metoprolol as directed and I have told her to do so.  Herpes simplex type 2 infection [name] on Valtrex for this.  Arthralgia of shoulder, unspecified laterality ADDENDUM ADDED 11/04/2016:  RECEIVED OV NOTE FROM [facility]---DR. BEANE--DATED 10/29/2016: His note states that she was there for follow-up of her shoulder bilateral shoulder pain. Says that she was 5 months out from when the symptoms began and 5 weeks out from right shoulder cortisone injection.  He stated that symptoms were 50% better and reported that current pain level mild.  She had been using physical therapy and also using Ultram.  On exam she had significant improvement in range of motion and was near full range of motion. Slightly decreased internal rotation and forward flexion. Negative impingement sign.  He reviewed physical therapy notes.  Assessment: Resolving adhesive capsulitis of the shoulders bilaterally Plan: Complete formal supervised physical therapy. Continue activity modification. She asked for 1 more prescription for tramadol. She can take up to twice a day. [name] not getting it from anywhere else and is not somnolent with this. No refills. She will convert to over-the-counter anti-inflammatories and/or Tylenol. Complete her physical therapy and then engage in a home exercise program. We will see her back on an as-needed basis. Fortunately this is much improved. He gave tramadol 50 mg 1 tablet twice daily when necessary severe pain #30 with no refill.   Insomnia [name] to f/u with Psych regarding this  Nonintractable headache, unspecified chronicity pattern, unspecified  headache type Cont PRN Butalbitol and Phenrgan   Signed, [address] [place], [place], [facility] 03/18/2016 11:53 AM

## 2016-03-20 ENCOUNTER — Other Ambulatory Visit: Payer: Self-pay | Admitting: *Deleted

## 2016-03-20 MED ORDER — CLOBETASOL PROPIONATE 0.05 % EX CREA: 1.0000 "application " | TOPICAL_CREAM | CUTANEOUS | Status: DC | PRN

## 2016-03-20 MED ORDER — ACYCLOVIR 5 % EX CREA: 1.0000 "application " | TOPICAL_CREAM | CUTANEOUS | Status: DC | PRN

## 2016-03-28 ENCOUNTER — Other Ambulatory Visit: Payer: Self-pay | Admitting: Family Medicine

## 2016-03-28 DIAGNOSIS — H9113 Presbycusis, bilateral: Secondary | ICD-10-CM | POA: Insufficient documentation

## 2016-03-28 NOTE — Telephone Encounter (Signed)
error 

## 2016-04-05 ENCOUNTER — Other Ambulatory Visit: Payer: Self-pay | Admitting: Family Medicine

## 2016-04-05 NOTE — Telephone Encounter (Signed)
Can do # 90 + 1

## 2016-04-05 NOTE — Telephone Encounter (Signed)
LRF 03/01/16 #90  LOV 03/18/16  OK refill to mail order?  Want quantity do you want to approve?

## 2016-04-08 MED ORDER — BUTALBITAL-APAP-CAFFEINE 50-300-40 MG PO CAPS: ORAL_CAPSULE | ORAL | Status: DC

## 2016-04-08 NOTE — Telephone Encounter (Signed)
rx fax to mailorder 

## 2016-06-02 ENCOUNTER — Other Ambulatory Visit: Payer: Self-pay | Admitting: Physician Assistant

## 2016-06-03 NOTE — Telephone Encounter (Signed)
Medication refilled per protocol. 

## 2016-06-18 ENCOUNTER — Other Ambulatory Visit: Payer: Self-pay | Admitting: Family Medicine

## 2016-06-18 NOTE — Telephone Encounter (Signed)
No history on refills on Hydroxyzine or Lomotil.  Butalbital LRF 04/08/16 #90 +1.   Clobetasol RF 03/25/16 30 grams

## 2016-06-19 MED ORDER — CLOBETASOL PROPIONATE 0.05 % EX LOTN: 1.0000 "application " | TOPICAL_LOTION | Freq: Three times a day (TID) | CUTANEOUS | Status: DC | PRN

## 2016-06-19 MED ORDER — DIPHENOXYLATE-ATROPINE 2.5-0.025 MG PO TABS: 1.0000 | ORAL_TABLET | ORAL | Status: DC | PRN

## 2016-06-19 MED ORDER — HYDROXYZINE HCL 25 MG PO TABS: 25.0000 mg | ORAL_TABLET | Freq: Three times a day (TID) | ORAL | Status: DC | PRN

## 2016-06-19 MED ORDER — BUTALBITAL-APAP-CAFFEINE 50-300-40 MG PO CAPS: ORAL_CAPSULE | ORAL | Status: DC

## 2016-06-19 NOTE — Telephone Encounter (Signed)
Refills faxed to [facility]

## 2016-06-19 NOTE — Telephone Encounter (Signed)
May refill Hydroxyzine for # 30 +1 May refill Lomotil for # 30 + 1 Butalbitol # 90 +1 Clobetosol 30 gram +1

## 2016-06-24 ENCOUNTER — Other Ambulatory Visit: Payer: Self-pay | Admitting: Family Medicine

## 2016-06-25 ENCOUNTER — Other Ambulatory Visit: Payer: Self-pay | Admitting: Family Medicine

## 2016-06-25 MED ORDER — CLOBETASOL PROPIONATE 0.05 % EX SOLN: 1.0000 "application " | Freq: Two times a day (BID) | CUTANEOUS | Status: DC

## 2016-06-25 NOTE — Telephone Encounter (Signed)
We do not use longer than 3 months.

## 2016-06-25 NOTE — Telephone Encounter (Signed)
Ok to refill??      LOV w/ MBD 01/29/16 LRF 01/29/16 #30 / 2 RF

## 2016-06-27 NOTE — Telephone Encounter (Signed)
Denied.  Only use for 3 months.

## 2016-07-29 ENCOUNTER — Encounter: Payer: Self-pay | Admitting: Family Medicine

## 2016-07-29 ENCOUNTER — Ambulatory Visit (INDEPENDENT_AMBULATORY_CARE_PROVIDER_SITE_OTHER): Payer: TRICARE For Life (TFL) | Admitting: Family Medicine

## 2016-07-29 VITALS — BP 134/92 | HR 56 | Temp 97.9°F | Resp 16 | Wt 176.0 lb

## 2016-07-29 DIAGNOSIS — Z23 Encounter for immunization: Secondary | ICD-10-CM

## 2016-07-29 DIAGNOSIS — E669 Obesity, unspecified: Secondary | ICD-10-CM

## 2016-07-29 DIAGNOSIS — G43011 Migraine without aura, intractable, with status migrainosus: Secondary | ICD-10-CM | POA: Diagnosis not present

## 2016-07-29 MED ORDER — PHENTERMINE HCL 37.5 MG PO CAPS: 37.5000 mg | ORAL_CAPSULE | ORAL | 2 refills | Status: DC

## 2016-07-29 MED ORDER — PROMETHAZINE HCL 25 MG PO TABS: ORAL_TABLET | ORAL | 0 refills | Status: DC

## 2016-07-29 MED ORDER — KETOROLAC TROMETHAMINE 60 MG/2ML IM SOLN
60.0000 mg | Freq: Once | INTRAMUSCULAR | Status: AC
  Administered 2016-07-29: 60 mg via INTRAMUSCULAR

## 2016-07-29 MED ORDER — PROMETHAZINE HCL 25 MG/ML IJ SOLN
25.0000 mg | Freq: Once | INTRAMUSCULAR | Status: AC
  Administered 2016-07-29: 25 mg via INTRAMUSCULAR

## 2016-07-29 NOTE — Progress Notes (Signed)
Subjective:    Patient ID: Peggy Fitzgerald, female    DOB: 08-29-1956, 60 y.o.   MRN: [id number]  HPI  01/2016 Patient has 3 concerns. #1 she would like to be tested for hepatitis C.  #2 she would like a medication to assist with weight loss. She has a history of prediabetes. In the past she is taking Adipex without difficulty.  [name] interested in Ripleycontrave but after discussing the price, she declined.  She denies any chest pain shortness of breath or dyspnea on exertion. Her blood pressures well controlled at 120/78. She also complains of pain in her right knee 1 week. The pain is located over the posterior lateral joint line. She also reports instability in the knee joint. She denies any specific injury. She has no laxity to varus or valgus stress. She has a negative anterior posterior drawer sign. She has no tenderness to palpation over the joint line. She does have a positive Apley grind. At that time, my plan was: I suspect meniscal tear versus arthritis. Using sterile technique, I injected the right knee with a mixture of 2 mL of lidocaine, 2 mL of Marcaine, and 2 mL of 40 mg per mL Kenalog. I will screen the patient has requested for hepatitis C. I will also give her a 90 day supply of Adipex 37.5 mg 1 by mouth every morning to assist in weight loss in addition to diet exercise and lifestyle changes.  07/29/16 MRI confirmed meniscal tear and patient was referred to ortho.  She subsequently underwent surgery in May. She presents today with a migraine headache for 7 days. She reports a pulsatile headache with photophobia and phonophobia. It has not been relieved by her typical Fioricet. [name] also asking for refill on Adipex that I gave her in February. The medication really helped with her weight loss. She realizes the risk of dependency but she would like to try it for an additional 3 months to see if she can get her weight down to 165 pounds. [name] also trying lifestyle changes including  diet exercise. Past Medical History:  Diagnosis Date  . Allergy    seasonal  . Anxiety   . Asthma    asthmatic bronchitis  . Cataract 2007  . Depression   . GERD (gastroesophageal reflux disease)   . Herpes simplex type 2 infection   . Hyperlipidemia   . Hypertension   . Prediabetes    Past Surgical History:  Procedure Laterality Date  . CATARACT EXTRACTION, BILATERAL    . CHOLECYSTECTOMY  1987   Current Outpatient Prescriptions on File Prior to Visit  Medication Sig Dispense Refill  . Albuterol (PROVENTIL IN) Inhale into the lungs. With aerochamber    . atorvastatin (LIPITOR) 20 MG tablet Take 1 tablet (20 mg total) by mouth at bedtime. 90 tablet 1  . Biotin (BIOTIN MAXIMUM STRENGTH) 10 MG TABS Take 1 tablet by mouth daily.    . Butalbital-APAP-Caffeine 50-300-40 MG CAPS TAKE 1 TO 2 CAPSULES EVERY 4 HOURS AS NEEDED (NO MORE THAN 6 A DAY) 90 capsule 1  . celecoxib (CELEBREX) 200 MG capsule Take 1 capsule (200 mg total) by mouth as needed. 90 capsule 1  . Cholecalciferol (VITAMIN D3) 5000 units CAPS Take 1 capsule by mouth daily.    . clobetasol (TEMOVATE) 0.05 % external solution Apply 1 application topically 2 (two) times daily. 150 mL 3  . Clobetasol Propionate 0.05 % lotion Apply 1 application topically 3 (three) times daily  as needed. 59 mL 1  . diphenoxylate-atropine (LOMOTIL) 2.5-0.025 MG tablet Take 1 tablet by mouth as needed for diarrhea or loose stools. 30 tablet 1  . esomeprazole (NEXIUM) 40 MG capsule Take 40 mg by mouth daily at 12 noon.    . Flaxseed, Linseed, (FLAXSEED OIL MAX STR) 1300 MG CAPS Take 1 capsule by mouth 2 (two) times daily.    . fluconazole (DIFLUCAN) 150 MG tablet Take 1 tablet (150 mg total) by mouth as needed. 1 tablet 2  . HYDROcodone-acetaminophen (NORCO/VICODIN) 5-325 MG tablet Take 1 tablet by mouth every 6 (six) hours as needed for moderate pain. 30 tablet 0  . HYDROcodone-homatropine (HYCODAN) 5-1.5 MG/5ML syrup Take 5 mLs by mouth every 6  (six) hours as needed for cough. Reported on 12/18/2015    . hydrOXYzine (ATARAX/VISTARIL) 25 MG tablet Take 1 tablet (25 mg total) by mouth 3 (three) times daily as needed. 30 tablet 1  . LORazepam (ATIVAN) 1 MG tablet Take 1 tablet (1 mg total) by mouth 3 (three) times daily as needed for anxiety. 90 tablet 2  . Magnesium 400 MG TABS Take 1 tablet by mouth daily.    . meclizine (ANTIVERT) 25 MG tablet Take 1 tablet (25 mg total) by mouth as needed for dizziness. 90 tablet 0  . metoprolol succinate (TOPROL-XL) 100 MG 24 hr tablet Take 1 tablet (100 mg total) by mouth at bedtime. 90 tablet 1  . mometasone (NASONEX) 50 MCG/ACT nasal spray Place 2 sprays into the nose daily.    . Multiple Vitamins-Minerals (ONE DAILY MULTIVITAMIN WOMEN PO) Take 1 tablet by mouth daily.    . mupirocin cream (BACTROBAN) 2 % Apply 1 application topically as needed.    . Ospemifene (OSPHENA) 60 MG TABS Take 1 tablet by mouth at bedtime. 90 tablet 1  . phentermine 37.5 MG capsule Take 1 capsule (37.5 mg total) by mouth every morning. 30 capsule 2  . promethazine (PHENERGAN) 25 MG tablet TAKE 1 TABLET EVERY 6 HOURS AS NEEDED FOR NAUSEA OR VOMITING 90 tablet 0  . REXULTI 3 MG TABS Take 1 tablet by mouth daily.    Marland Kitchen topiramate (TOPAMAX) 50 MG tablet Take 150 mg by mouth at bedtime.    . valACYclovir (VALTREX) 500 MG tablet Take 1 tablet (500 mg total) by mouth daily. 90 tablet 1  . Vortioxetine HBr (TRINTELLIX) 20 MG TABS Take 20 mg by mouth daily.    Marland Kitchen zolpidem (AMBIEN CR) 12.5 MG CR tablet Take 1 tablet (12.5 mg total) by mouth at bedtime. 90 tablet 2  . ZOVIRAX 5 % APPLY 1 APPLICATION TOPICALLY AS NEEDED 15 g 3   No current facility-administered medications on file prior to visit.    Allergies  Allergen Reactions  . Oxycodone-Acetaminophen     REACTION: itches   Social History   Social History  . Marital status: Married    Spouse name: N/A  . Number of children: N/A  . Years of education: N/A   Occupational  History  . Not on file.   Social History Main Topics  . Smoking status: Never Smoker  . Smokeless tobacco: Never Used  . Alcohol use No  . Drug use: No  . Sexual activity: Yes    Birth control/ protection: Surgical     Comment: spouse vasectomy   Other Topics Concern  . Not on file   Social History Narrative  . No narrative on file      Review of Systems  All  other systems reviewed and are negative.      Objective:   Physical Exam  Constitutional: [name] oriented to person, place, and time.  Cardiovascular: Normal rate, regular rhythm and normal heart sounds.   Pulmonary/Chest: Effort normal and breath sounds normal. No respiratory distress. She has no wheezes. She has no rales.  Abdominal: Soft. Bowel sounds are normal.  Neurological: [name] alert and oriented to person, place, and time. She has normal reflexes. She displays normal reflexes. No cranial nerve deficit. She exhibits normal muscle tone. Coordination normal.  Vitals reviewed.         Assessment & Plan:  Status migrainosus. The patient received Toradol 60 mg IM 1 and Phenergan 25 mg IM 1. Consider a prednisone taper pack if headache persists. I will refill her Adipex but I instructed the patient not to start taking the medication until after her headache resolves. I will not refill this medication again. This will be the last 3 month prescription she can get.  Explained if there is a risk of dependency and habituation to this medication.

## 2016-08-05 ENCOUNTER — Telehealth: Payer: Self-pay | Admitting: *Deleted

## 2016-08-05 MED ORDER — PREDNISONE 20 MG PO TABS: ORAL_TABLET | ORAL | 0 refills | Status: DC

## 2016-08-05 NOTE — Telephone Encounter (Signed)
Prescription sent to pharmacy.   Call placed to patient and patient made aware per VM. 

## 2016-08-05 NOTE — Telephone Encounter (Signed)
Ok with prednisone taper pack. 

## 2016-08-05 NOTE — Telephone Encounter (Signed)
Received call from patient.   Reports that MD recommended prednisone taper at last visit if HA did not improve.   Reports that there has been no improvement to HA.   MD please advise.

## 2016-08-09 ENCOUNTER — Ambulatory Visit (INDEPENDENT_AMBULATORY_CARE_PROVIDER_SITE_OTHER): Payer: TRICARE For Life (TFL) | Admitting: Family Medicine

## 2016-08-09 VITALS — BP 136/64 | HR 74 | Temp 98.0°F | Resp 14 | Ht 66.0 in | Wt 174.0 lb

## 2016-08-09 DIAGNOSIS — A084 Viral intestinal infection, unspecified: Secondary | ICD-10-CM | POA: Diagnosis not present

## 2016-08-09 MED ORDER — DICYCLOMINE HCL 10 MG PO CAPS: 10.0000 mg | ORAL_CAPSULE | Freq: Three times a day (TID) | ORAL | 0 refills | Status: DC

## 2016-08-09 NOTE — Progress Notes (Signed)
Subjective:    Patient ID: Peggy Fitzgerald, female    DOB: 09/22/1956, 60 y.o.   MRN: [id number]  Medication Refill   Patient reports a 5 day history of nausea occasional vomiting, low-grade fevers, crampy abdominal pain, and diarrhea. She does have some tenderness in the right lower quadrant but there is no guarding, no rebound, no peritoneal signs, and no evidence of an acute abdomen. She denies any constipation. She denies any dysuria or hematuria. She denies any vaginal bleeding or vaginal discharge. Past Medical History:  Diagnosis Date  . Allergy    seasonal  . Anxiety   . Asthma    asthmatic bronchitis  . Cataract 2007  . Depression   . GERD (gastroesophageal reflux disease)   . Herpes simplex type 2 infection   . Hyperlipidemia   . Hypertension   . Prediabetes    Past Surgical History:  Procedure Laterality Date  . CATARACT EXTRACTION, BILATERAL    . CHOLECYSTECTOMY  1987   Current Outpatient Prescriptions on File Prior to Visit  Medication Sig Dispense Refill  . Butalbital-APAP-Caffeine 50-300-40 MG CAPS TAKE 1 TO 2 CAPSULES EVERY 4 HOURS AS NEEDED (NO MORE THAN 6 A DAY) 90 capsule 1  . diphenoxylate-atropine (LOMOTIL) 2.5-0.025 MG tablet Take 1 tablet by mouth as needed for diarrhea or loose stools. 30 tablet 1  . Flaxseed, Linseed, (FLAXSEED OIL MAX STR) 1300 MG CAPS Take 1 capsule by mouth 2 (two) times daily.    Marland Kitchen. HYDROcodone-acetaminophen (NORCO/VICODIN) 5-325 MG tablet Take 1 tablet by mouth every 6 (six) hours as needed for moderate pain. 30 tablet 0  . hydrOXYzine (ATARAX/VISTARIL) 25 MG tablet Take 1 tablet (25 mg total) by mouth 3 (three) times daily as needed. 30 tablet 1  . LATUDA 60 MG TABS Take 1 tablet by mouth every evening. With evening meal    . meclizine (ANTIVERT) 25 MG tablet Take 1 tablet (25 mg total) by mouth as needed for dizziness. 90 tablet 0  . metoprolol succinate (TOPROL-XL) 100 MG 24 hr tablet Take 1 tablet (100 mg total) by mouth at  bedtime. 90 tablet 1  . promethazine (PHENERGAN) 25 MG tablet TAKE 1 TABLET EVERY 6 HOURS AS NEEDED FOR NAUSEA OR VOMITING 90 tablet 0  . valACYclovir (VALTREX) 500 MG tablet Take 1 tablet (500 mg total) by mouth daily. 90 tablet 1  . zolpidem (AMBIEN CR) 12.5 MG CR tablet Take 1 tablet (12.5 mg total) by mouth at bedtime. 90 tablet 2  . ZOVIRAX 5 % APPLY 1 APPLICATION TOPICALLY AS NEEDED 15 g 3  . Albuterol (PROVENTIL IN) Inhale into the lungs. With aerochamber    . atorvastatin (LIPITOR) 20 MG tablet Take 1 tablet (20 mg total) by mouth at bedtime. (Patient not taking: Reported on 08/09/2016) 90 tablet 1  . Biotin (BIOTIN MAXIMUM STRENGTH) 10 MG TABS Take 1 tablet by mouth daily.    . celecoxib (CELEBREX) 200 MG capsule Take 1 capsule (200 mg total) by mouth as needed. (Patient not taking: Reported on 08/09/2016) 90 capsule 1  . Cholecalciferol (VITAMIN D3) 5000 units CAPS Take 1 capsule by mouth daily.    . clobetasol (TEMOVATE) 0.05 % external solution Apply 1 application topically 2 (two) times daily. (Patient not taking: Reported on 08/09/2016) 150 mL 3  . Magnesium 400 MG TABS Take 1 tablet by mouth daily.    . mometasone (NASONEX) 50 MCG/ACT nasal spray Place 2 sprays into the nose daily.    . Multiple  Vitamins-Minerals (ONE DAILY MULTIVITAMIN WOMEN PO) Take 1 tablet by mouth daily.    . mupirocin cream (BACTROBAN) 2 % Apply 1 application topically as needed.    . Ospemifene (OSPHENA) 60 MG TABS Take 1 tablet by mouth at bedtime. (Patient not taking: Reported on 08/09/2016) 90 tablet 1  . phentermine 37.5 MG capsule Take 1 capsule (37.5 mg total) by mouth every morning. (Patient not taking: Reported on 08/09/2016) 30 capsule 2   No current facility-administered medications on file prior to visit.    Allergies  Allergen Reactions  . Oxycodone-Acetaminophen     REACTION: itches   Social History   Social History  . Marital status: Married    Spouse name: N/A  . Number of children: N/A  .  Years of education: N/A   Occupational History  . Not on file.   Social History Main Topics  . Smoking status: Never Smoker  . Smokeless tobacco: Never Used  . Alcohol use No  . Drug use: No  . Sexual activity: Yes    Birth control/ protection: Surgical     Comment: spouse vasectomy   Other Topics Concern  . Not on file   Social History Narrative  . No narrative on file      Review of Systems  All other systems reviewed and are negative.      Objective:   Physical Exam  Constitutional: [name] oriented to person, place, and time.  Cardiovascular: Normal rate, regular rhythm and normal heart sounds.   Pulmonary/Chest: Effort normal and breath sounds normal. No respiratory distress. She has no wheezes. She has no rales.  Abdominal: Soft. Bowel sounds are normal.  Neurological: [name] alert and oriented to person, place, and time. She has normal reflexes. No cranial nerve deficit. She exhibits normal muscle tone. Coordination normal.  Vitals reviewed.         Assessment & Plan:  Viral gastroenteritis - Plan: dicyclomine (BENTYL) 10 MG capsule  Symptoms are consistent with a viral gastroenteritis, use Phenergan 25 mg every 6 hours as needed for nausea. Consume a brat diet, use Bentyl 10 mg every 6 hours as needed for crampy abdominal spasms

## 2016-08-21 ENCOUNTER — Encounter: Payer: Self-pay | Admitting: Physician Assistant

## 2016-08-21 ENCOUNTER — Ambulatory Visit (INDEPENDENT_AMBULATORY_CARE_PROVIDER_SITE_OTHER): Payer: TRICARE For Life (TFL) | Admitting: Physician Assistant

## 2016-08-21 VITALS — BP 130/74 | HR 48 | Temp 97.6°F | Resp 16 | Wt 178.0 lb

## 2016-08-21 DIAGNOSIS — N760 Acute vaginitis: Secondary | ICD-10-CM | POA: Diagnosis not present

## 2016-08-21 DIAGNOSIS — B3731 Acute candidiasis of vulva and vagina: Secondary | ICD-10-CM

## 2016-08-21 DIAGNOSIS — B373 Candidiasis of vulva and vagina: Secondary | ICD-10-CM | POA: Diagnosis not present

## 2016-08-21 DIAGNOSIS — A499 Bacterial infection, unspecified: Secondary | ICD-10-CM

## 2016-08-21 DIAGNOSIS — B9689 Other specified bacterial agents as the cause of diseases classified elsewhere: Secondary | ICD-10-CM

## 2016-08-21 LAB — WET PREP FOR TRICH, YEAST, CLUE: Trich, Wet Prep: NONE SEEN

## 2016-08-21 MED ORDER — FLUCONAZOLE 150 MG PO TABS: 150.0000 mg | ORAL_TABLET | Freq: Once | ORAL | 0 refills | Status: AC

## 2016-08-21 MED ORDER — METRONIDAZOLE 500 MG PO TABS: 500.0000 mg | ORAL_TABLET | Freq: Two times a day (BID) | ORAL | 0 refills | Status: DC

## 2016-08-21 NOTE — Progress Notes (Signed)
Patient ID: Peggy Fitzgerald MRN: [id number], DOB: 11/10/1956, 60 y.o. Date of Encounter: 08/21/2016, 12:27 PM    Chief Complaint:  Chief Complaint  Patient presents with  . vaginal odor     HPI: 60 y.o. year old female says that she has been diagnosed with genital herpes in the past and has Valtrex to use for this.  Has been experiencing some vaginal irritation and is also been noticing some odor. Wasn't sure if it is secondary to the herpes or if she has another issue going on so wanted to get checked. No additional complaints or concerns.     Home Meds:   Outpatient Medications Prior to Visit  Medication Sig Dispense Refill  . Butalbital-APAP-Caffeine 50-300-40 MG CAPS TAKE 1 TO 2 CAPSULES EVERY 4 HOURS AS NEEDED (NO MORE THAN 6 A DAY) 90 capsule 1  . clobetasol (TEMOVATE) 0.05 % external solution Apply 1 application topically 2 (two) times daily. 150 mL 3  . diphenoxylate-atropine (LOMOTIL) 2.5-0.025 MG tablet Take 1 tablet by mouth as needed for diarrhea or loose stools. 30 tablet 1  . hydrOXYzine (ATARAX/VISTARIL) 25 MG tablet Take 1 tablet (25 mg total) by mouth 3 (three) times daily as needed. 30 tablet 1  . LATUDA 60 MG TABS Take 1 tablet by mouth every evening. With evening meal    . meclizine (ANTIVERT) 25 MG tablet Take 1 tablet (25 mg total) by mouth as needed for dizziness. 90 tablet 0  . methocarbamol (ROBAXIN) 500 MG tablet Take 500 mg by mouth 4 (four) times daily.    . metoprolol succinate (TOPROL-XL) 100 MG 24 hr tablet Take 1 tablet (100 mg total) by mouth at bedtime. 90 tablet 1  . promethazine (PHENERGAN) 25 MG tablet TAKE 1 TABLET EVERY 6 HOURS AS NEEDED FOR NAUSEA OR VOMITING 90 tablet 0  . traMADol (ULTRAM) 50 MG tablet Take by mouth every 6 (six) hours as needed.    . valACYclovir (VALTREX) 500 MG tablet Take 1 tablet (500 mg total) by mouth daily. 90 tablet 1  . zolpidem (AMBIEN CR) 12.5 MG CR tablet Take 1 tablet (12.5 mg total) by mouth at bedtime.  90 tablet 2  . ZOVIRAX 5 % APPLY 1 APPLICATION TOPICALLY AS NEEDED 15 g 3  . Albuterol (PROVENTIL IN) Inhale into the lungs. With aerochamber    . atorvastatin (LIPITOR) 20 MG tablet Take 1 tablet (20 mg total) by mouth at bedtime. (Patient not taking: Reported on 08/21/2016) 90 tablet 1  . Biotin (BIOTIN MAXIMUM STRENGTH) 10 MG TABS Take 1 tablet by mouth daily.    . celecoxib (CELEBREX) 200 MG capsule Take 1 capsule (200 mg total) by mouth as needed. (Patient not taking: Reported on 08/21/2016) 90 capsule 1  . Cholecalciferol (VITAMIN D3) 5000 units CAPS Take 1 capsule by mouth daily.    Marland Kitchen dicyclomine (BENTYL) 10 MG capsule Take 1 capsule (10 mg total) by mouth 4 (four) times daily -  before meals and at bedtime. (Patient not taking: Reported on 08/21/2016) 60 capsule 0  . Flaxseed, Linseed, (FLAXSEED OIL MAX STR) 1300 MG CAPS Take 1 capsule by mouth 2 (two) times daily.    Marland Kitchen HYDROcodone-acetaminophen (NORCO/VICODIN) 5-325 MG tablet Take 1 tablet by mouth every 6 (six) hours as needed for moderate pain. (Patient not taking: Reported on 08/21/2016) 30 tablet 0  . Magnesium 400 MG TABS Take 1 tablet by mouth daily.    . mometasone (NASONEX) 50 MCG/ACT nasal spray Place 2 sprays  into the nose daily.    . Multiple Vitamins-Minerals (ONE DAILY MULTIVITAMIN WOMEN PO) Take 1 tablet by mouth daily.    . mupirocin cream (BACTROBAN) 2 % Apply 1 application topically as needed.    . Ospemifene (OSPHENA) 60 MG TABS Take 1 tablet by mouth at bedtime. (Patient not taking: Reported on 08/21/2016) 90 tablet 1  . phentermine 37.5 MG capsule Take 1 capsule (37.5 mg total) by mouth every morning. (Patient not taking: Reported on 08/21/2016) 30 capsule 2   No facility-administered medications prior to visit.     Allergies:  Allergies  Allergen Reactions  . Oxycodone-Acetaminophen     REACTION: itches      Review of Systems: See HPI for pertinent ROS. All other ROS negative.    Physical Exam: Blood pressure  130/74, pulse (!) 48, temperature 97.6 F (36.4 C), temperature source Oral, resp. rate 16, weight 178 lb (80.7 kg)., Body mass index is 28.73 kg/m. General:  WNWD WF. Appears in no acute distress. Neck: Supple. No thyromegaly. No lymphadenopathy. Lungs: Clear bilaterally to auscultation without wheezes, rales, or rhonchi. Breathing is unlabored. Heart: Regular rhythm. No murmurs, rubs, or gallops. Msk:  Strength and tone normal for age. Pelvic Exam: External genitalia normal. No skin lesions seen on the external genitalia. Vaginal Mucosa: On her right side--of vaginal lining--there is a small raised ( ~ 2mm) papule that is color of vaginal skin. Is see no other lesions. There is small amount of discharge present--liquidy, whitish color) Bimanual exam normal. Extremities/Skin: Warm and dry. Neuro: Alert and oriented X 3. Moves all extremities spontaneously. Gait is normal. CNII-XII grossly in tact. Psych:  Responds to questions appropriately with a normal affect.   Results for orders placed or performed in visit on 08/21/16  WET PREP FOR TRICH, YEAST, CLUE  Result Value Ref Range   Yeast Wet Prep HPF POC FEW (A) NONE SEEN   Trich, Wet Prep NONE SEEN NONE SEEN   Clue Cells Wet Prep HPF POC FEW (A) NONE SEEN   WBC, Wet Prep HPF POC FEW NONE SEEN     ASSESSMENT AND PLAN:  60 y.o. year old female with   1. BV (bacterial vaginosis) -Flagyl 500mg  1 po BID x 7 days # 14+0  2. Vaginal candidiasis - fluconazole (DIFLUCAN) 150 MG tablet; Take 1 tablet (150 mg total) by mouth once.  Dispense: 1 tablet; Refill: 0  3. Vaginitis and vulvovaginitis - GC/Chlamydia Probe Amp - WET PREP FOR TRICH, YEAST, CLUE - metroNIDAZOLE (FLAGYL) 500 MG tablet; Take 1 tablet (500 mg total) by mouth 2 (two) times daily.  Dispense: 14 tablet; Refill: 0  4. HSV2--Cont Valtrex  Discussed wet prep results with patient. Showed small amount of clue cells and small amount of yeast. [name] to take the Diflucan and  Flagyl as directed. Continue Valtrex for suppression of herpes type II. F/U PRN.   [address]igned, Janitza Revuelta Beth HaydenDixon, [place], [facility] 08/21/2016 12:27 PM

## 2016-08-22 LAB — GC/CHLAMYDIA PROBE AMP
CT PROBE, AMP APTIMA: NOT DETECTED
GC PROBE AMP APTIMA: NOT DETECTED

## 2016-08-23 ENCOUNTER — Other Ambulatory Visit: Payer: Self-pay

## 2016-08-23 MED ORDER — DIPHENOXYLATE-ATROPINE 2.5-0.025 MG PO TABS: 1.0000 | ORAL_TABLET | ORAL | 5 refills | Status: DC | PRN

## 2016-08-23 NOTE — Telephone Encounter (Signed)
Approved.  #30+5. 

## 2016-08-23 NOTE — Telephone Encounter (Signed)
Last ov 9/13 Last refill 7/12 Okay to refill?

## 2016-08-27 ENCOUNTER — Emergency Department (HOSPITAL_COMMUNITY)
Admission: EM | Admit: 2016-08-27 | Discharge: 2016-08-28 | Disposition: A | Discharge status: Other Acute Inpatient Hospital | Attending: Emergency Medicine | Admitting: Emergency Medicine

## 2016-08-27 ENCOUNTER — Encounter (HOSPITAL_COMMUNITY): Payer: Self-pay | Admitting: Emergency Medicine

## 2016-08-27 DIAGNOSIS — F32A Depression, unspecified: Secondary | ICD-10-CM

## 2016-08-27 DIAGNOSIS — F329 Major depressive disorder, single episode, unspecified: Secondary | ICD-10-CM | POA: Diagnosis not present

## 2016-08-27 DIAGNOSIS — Z79899 Other long term (current) drug therapy: Secondary | ICD-10-CM | POA: Insufficient documentation

## 2016-08-27 DIAGNOSIS — J45909 Unspecified asthma, uncomplicated: Secondary | ICD-10-CM | POA: Diagnosis not present

## 2016-08-27 DIAGNOSIS — I10 Essential (primary) hypertension: Secondary | ICD-10-CM | POA: Diagnosis not present

## 2016-08-27 LAB — CBC WITH DIFFERENTIAL/PLATELET
BASOS ABS: 0.1 10*3/uL (ref 0.0–0.1)
Basophils Relative: 1 %
Eosinophils Absolute: 0.3 10*3/uL (ref 0.0–0.7)
Eosinophils Relative: 3 %
HCT: 41 % (ref 36.0–46.0)
Hemoglobin: 14 g/dL (ref 12.0–15.0)
LYMPHS ABS: 1.9 10*3/uL (ref 0.7–4.0)
Lymphocytes Relative: 23 %
MCH: 32 pg (ref 26.0–34.0)
MCHC: 34.1 g/dL (ref 30.0–36.0)
MCV: 93.8 fL (ref 78.0–100.0)
MONO ABS: 0.7 10*3/uL (ref 0.1–1.0)
MONOS PCT: 8 %
Neutro Abs: 5.3 10*3/uL (ref 1.7–7.7)
Neutrophils Relative %: 65 %
PLATELETS: 203 10*3/uL (ref 150–400)
RBC: 4.37 MIL/uL (ref 3.87–5.11)
RDW: 14.8 % (ref 11.5–15.5)
WBC: 8.3 10*3/uL (ref 4.0–10.5)

## 2016-08-27 LAB — COMPREHENSIVE METABOLIC PANEL
ALK PHOS: 66 U/L (ref 38–126)
ALT: 12 U/L — ABNORMAL LOW (ref 14–54)
ALT: 13 U/L — AB (ref 14–54)
ANION GAP: 8 (ref 5–15)
AST: 18 U/L (ref 15–41)
AST: 17 U/L (ref 15–41)
Albumin: 3.6 g/dL (ref 3.5–5.0)
Albumin: 3.6 g/dL (ref 3.5–5.0)
Alkaline Phosphatase: 67 U/L (ref 38–126)
Anion gap: 11 (ref 5–15)
BILIRUBIN TOTAL: 0.5 mg/dL (ref 0.3–1.2)
BILIRUBIN TOTAL: 0.8 mg/dL (ref 0.3–1.2)
BUN: 8 mg/dL (ref 6–20)
BUN: 9 mg/dL (ref 6–20)
CALCIUM: 8.9 mg/dL (ref 8.9–10.3)
CO2: 19 mmol/L — ABNORMAL LOW (ref 22–32)
CO2: 22 mmol/L (ref 22–32)
CREATININE: 0.76 mg/dL (ref 0.44–1.00)
CREATININE: 0.75 mg/dL (ref 0.44–1.00)
Calcium: 9 mg/dL (ref 8.9–10.3)
Chloride: 112 mmol/L — ABNORMAL HIGH (ref 101–111)
Chloride: 111 mmol/L (ref 101–111)
Glucose, Bld: 97 mg/dL (ref 65–99)
Glucose, Bld: 100 mg/dL — ABNORMAL HIGH (ref 65–99)
POTASSIUM: 3.8 mmol/L (ref 3.5–5.1)
Potassium: 3.7 mmol/L (ref 3.5–5.1)
SODIUM: 141 mmol/L (ref 135–145)
Sodium: 142 mmol/L (ref 135–145)
TOTAL PROTEIN: 6.3 g/dL — AB (ref 6.5–8.1)
TOTAL PROTEIN: 6.6 g/dL (ref 6.5–8.1)

## 2016-08-27 LAB — RAPID URINE DRUG SCREEN, HOSP PERFORMED
Amphetamines: NOT DETECTED
BENZODIAZEPINES: POSITIVE — AB
Barbiturates: POSITIVE — AB
COCAINE: NOT DETECTED
OPIATES: NOT DETECTED
Tetrahydrocannabinol: NOT DETECTED

## 2016-08-27 LAB — ETHANOL: Alcohol, Ethyl (B): <5 mg/dL (ref <5)

## 2016-08-27 LAB — ACETAMINOPHEN LEVEL

## 2016-08-27 LAB — SALICYLATE LEVEL: Salicylate Lvl: <4 mg/dL (ref 2.8–30.0)

## 2016-08-27 MED ORDER — METHOCARBAMOL 500 MG PO TABS
500.0000 mg | ORAL_TABLET | Freq: Four times a day (QID) | ORAL | Status: DC
  Administered 2016-08-27: 500 mg via ORAL
  Filled 2016-08-27: qty 1

## 2016-08-27 MED ORDER — METOPROLOL SUCCINATE ER 25 MG PO TB24
100.0000 mg | ORAL_TABLET | Freq: Every day | ORAL | Status: DC
  Administered 2016-08-27: 100 mg via ORAL
  Filled 2016-08-27: qty 4

## 2016-08-27 MED ORDER — DIPHENOXYLATE-ATROPINE 2.5-0.025 MG PO TABS: 1.0000 | ORAL_TABLET | Freq: Four times a day (QID) | ORAL | Status: DC | PRN

## 2016-08-27 MED ORDER — ACYCLOVIR 5 % EX CREA
TOPICAL_CREAM | Freq: Two times a day (BID) | CUTANEOUS | Status: DC | PRN
  Filled 2016-08-27: qty 5

## 2016-08-27 MED ORDER — LURASIDONE HCL 40 MG PO TABS
40.0000 mg | ORAL_TABLET | Freq: Two times a day (BID) | ORAL | Status: DC
  Administered 2016-08-27: 40 mg via ORAL
  Filled 2016-08-27: qty 1

## 2016-08-27 MED ORDER — VALACYCLOVIR HCL 500 MG PO TABS
500.0000 mg | ORAL_TABLET | Freq: Two times a day (BID) | ORAL | Status: DC
  Filled 2016-08-27: qty 1

## 2016-08-27 MED ORDER — ZOLPIDEM TARTRATE 5 MG PO TABS
5.0000 mg | ORAL_TABLET | Freq: Every evening | ORAL | Status: DC | PRN
Start: 2016-08-27 — End: 2016-08-28

## 2016-08-27 MED ORDER — HYDROXYZINE HCL 25 MG PO TABS: 25.0000 mg | ORAL_TABLET | Freq: Three times a day (TID) | ORAL | Status: DC | PRN

## 2016-08-27 MED ORDER — ALBUTEROL SULFATE HFA 108 (90 BASE) MCG/ACT IN AERS: 2.0000 | INHALATION_SPRAY | Freq: Four times a day (QID) | RESPIRATORY_TRACT | Status: DC | PRN

## 2016-08-27 MED ORDER — METRONIDAZOLE 500 MG PO TABS
500.0000 mg | ORAL_TABLET | Freq: Once | ORAL | Status: AC
  Administered 2016-08-27: 500 mg via ORAL
  Filled 2016-08-27: qty 1

## 2016-08-27 NOTE — ED Notes (Signed)
Called report to Brett CanalesSteve, [facility].

## 2016-08-27 NOTE — ED Notes (Signed)
Peggy Fitzgerald from TTS called and told me that [name] [facility] and she can come at any time.

## 2016-08-27 NOTE — ED Notes (Signed)
Called Steve @ [facility], gave BP results.  [facility]

## 2016-08-27 NOTE — ED Notes (Signed)
Pt vitals reassessed per RN.

## 2016-08-27 NOTE — ED Notes (Signed)
Pt placed in wine colored scrubs and belongings bagged.

## 2016-08-27 NOTE — ED Notes (Signed)
[facility]eve [facility] and advised BP check just done and the results., Per Brett CanalesSteve  [facility] reche[facility] BP in one hour standing and sitting.  If BP remains below 180 [facility].
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
## 2016-08-27 NOTE — ED Notes (Signed)
Ordered dinner tray/NO SHARPS

## 2016-08-27 NOTE — ED Notes (Signed)
Psych eval at this time

## 2016-08-27 NOTE — BH Assessment (Signed)
TTS consult has been completed. Clinician consulted with Donell SievertSpencer Simon, PA and [facility] Attending will be Dr.Kumar. Alvino ChapelEllen, RN has been informed of pt disposition. Call report to [phone].

## 2016-08-27 NOTE — ED Notes (Signed)
Per Brett CanalesSteve, RN @ [facility] pt cannot

## 2016-08-27 NOTE — ED Notes (Signed)
Pt is denying SI or HI, she is tearful and states that she is depressed but will not harm herself

## 2016-08-27 NOTE — ED Triage Notes (Addendum)
Has bipolar and saw FNP today for medication evaluation. Was sent by her for possible outpatient structured program evaluation. She states her Mother-in-law has been living with her for a year and been a huge source of stress for her. She needs care that she is not able to provide her. Husband is not understanding of this. [place] and that will be helpful. Denies being suicidal or homicidal. She states she is trying to be proactive to not get to that point. [facility]- was given and ativan- states that helped. Pt states she promised her mother she would not commit suicide but hx of cutting. States "I just wish I could get off this train of the world right now" Deatra RobinsonKaren Jones is FNP and wants update.

## 2016-08-27 NOTE — ED Provider Notes (Signed)
[facility]-EMERGENCY DEPT Provider Note   CSN: [id number] Arrival date & time: 08/27/16  1145     History   Chief Complaint Chief Complaint  Patient presents with  . Depression    HPI Peggy Fitzgerald is a 60 y.o. female.  HPI   Friday "felt like going to snap" and started to feel better but yesterday started feeling the same.  Mother in law lives with them at home, not handling it well, needs 24-7 care.  Now losing it.  Couldn't say it to husband, feels like he is choosing mom over her. Feeling like can't deal with it. Went to ED last night. Told went to see Judson Roch who helps with medicines and felt ok leaving.  Working on [employer], because bipolar. Over the last bit having thoughts of hurting self Tried committing suicide in 2004. Told self won't do anything now because it would hurt her mother.  Reports she has heard that suicide is a permanent solution to a temporary situation, however sometimes the "temporary situation is just...."  Not sure if safe going home.     Past Medical History:  Diagnosis Date  . Allergy    seasonal  . Anxiety   . Asthma    asthmatic bronchitis  . Cataract 2007  . Depression   . GERD (gastroesophageal reflux disease)   . Herpes simplex type 2 infection   . Hyperlipidemia   . Hypertension   . Prediabetes     Patient Active Problem List   Diagnosis Date Noted  . Bipolar 1 disorder, mixed, moderate (HCC) 08/28/2016  . Insomnia 12/18/2015  . Headache 12/18/2015  . Vertigo 12/18/2015  . Allergy   . Anxiety   . Asthma   . Depression   . Prediabetes   . GERD (gastroesophageal reflux disease)   . Hyperlipidemia   . Hypertension   . Herpes simplex type 2 infection   . SHOULDER PAIN 04/24/2009  . IMPINGEMENT SYNDROME 04/24/2009    Past Surgical History:  Procedure Laterality Date  . CATARACT EXTRACTION, BILATERAL    . CHOLECYSTECTOMY  1987    OB History    No data available       Home Medications    Prior to  Admission medications   Medication Sig Start Date End Date Taking? Authorizing Provider  albuterol (PROVENTIL HFA;VENTOLIN HFA) 108 (90 Base) MCG/ACT inhaler Inhale 2 puffs into the lungs every 6 (six) hours as needed for wheezing or shortness of breath.   Yes Historical Provider, MD  Butalbital-APAP-Caffeine 50-300-40 MG CAPS TAKE 1 TO 2 CAPSULES EVERY 4 HOURS AS NEEDED (NO MORE THAN 6 A DAY) Patient taking differently: Take 1-2 capsules by mouth every 4 (four) hours as needed (for migaine). (NO MORE THAN 6 A DAY) 06/19/16  Yes Dorena Bodo, PA-C  celecoxib (CELEBREX) 200 MG capsule Take 1 capsule (200 mg total) by mouth as needed. Patient taking differently: Take 200 mg by mouth as needed. For plantar fascititis 01/15/16  Yes Dorena Bodo, PA-C  Cholecalciferol (VITAMIN D3) 5000 units CAPS Take 5,000 Units by mouth daily.    Yes Historical Provider, MD  clobetasol (TEMOVATE) 0.05 % external solution Apply 1 application topically 2 (two) times daily. Patient taking differently: Apply 1 application topically at bedtime as needed (for scalp itch).  06/25/16  Yes Dorena Bodo, PA-C  diphenoxylate-atropine (LOMOTIL) 2.5-0.025 MG tablet Take 1 tablet by mouth as needed for diarrhea or loose stools. 08/23/16  Yes Dorena Bodo, PA-C  Flaxseed, Linseed, (FLAXSEED OIL MAX STR) 1300 MG CAPS Take 1,300 mg by mouth 2 (two) times daily.    Yes Historical Provider, MD  hydrOXYzine (ATARAX/VISTARIL) 25 MG tablet Take 1 tablet (25 mg total) by mouth 3 (three) times daily as needed. Patient taking differently: Take 25 mg by mouth 3 (three) times daily as needed for itching.  06/19/16  Yes Mary B Dixon, PA-C  LATUDA 60 MG TABS Take 60 mg by mouth daily with supper. With evening meal 07/24/16  Yes Historical Provider, MD  LORazepam (ATIVAN) 1 MG tablet Take 1 mg by mouth every 8 (eight) hours as needed for anxiety.   Yes Historical Provider, MD  Magnesium 400 MG TABS Take 400 mg by mouth daily.    Yes Historical Provider, MD   meclizine (ANTIVERT) 25 MG tablet Take 1 tablet (25 mg total) by mouth as needed for dizziness. 01/15/16  Yes Dorena Bodo, PA-C  methocarbamol (ROBAXIN) 500 MG tablet Take 500 mg by mouth 4 (four) times daily.   Yes Historical Provider, MD  metoprolol succinate (TOPROL-XL) 100 MG 24 hr tablet Take 1 tablet (100 mg total) by mouth at bedtime. 01/16/16  Yes Donita Brooks, MD  metroNIDAZOLE (FLAGYL) 500 MG tablet Take 1 tablet (500 mg total) by mouth 2 (two) times daily. 08/21/16  Yes Mary B Dixon, PA-C  mometasone (NASONEX) 50 MCG/ACT nasal spray Place 2 sprays into the nose daily as needed (for allergies).    Yes Historical Provider, MD  Multiple Vitamins-Minerals (ONE DAILY MULTIVITAMIN WOMEN PO) Take 1 tablet by mouth daily.   Yes Historical Provider, MD  mupirocin cream (BACTROBAN) 2 % Apply 1 application topically as needed (for wound healing).    Yes Historical Provider, MD  phentermine 37.5 MG capsule Take 1 capsule (37.5 mg total) by mouth every morning. 07/29/16  Yes Donita Brooks, MD  promethazine (PHENERGAN) 25 MG tablet TAKE 1 TABLET EVERY 6 HOURS AS NEEDED FOR NAUSEA OR VOMITING 07/29/16  Yes Donita Brooks, MD  traMADol (ULTRAM) 50 MG tablet Take 50 mg by mouth every 6 (six) hours as needed for moderate pain.    Yes Historical Provider, MD  valACYclovir (VALTREX) 500 MG tablet Take 1 tablet (500 mg total) by mouth daily. Patient taking differently: Take 500 mg by mouth 2 (two) times daily.  01/15/16  Yes Mary B Dixon, PA-C  zolpidem (AMBIEN CR) 12.5 MG CR tablet Take 1 tablet (12.5 mg total) by mouth at bedtime. 01/15/16  Yes Mary B Dixon, PA-C  ZOVIRAX 5 % APPLY 1 APPLICATION TOPICALLY AS NEEDED Patient taking differently: APPLY 1 APPLICATION TOPICALLY twice daily AS NEEDED for outbreaks 06/03/16  Yes Donita Brooks, MD  atorvastatin (LIPITOR) 20 MG tablet Take 1 tablet (20 mg total) by mouth at bedtime. Patient not taking: Reported on 08/28/2016 01/15/16   Dorena Bodo, PA-C  dicyclomine  (BENTYL) 10 MG capsule Take 1 capsule (10 mg total) by mouth 4 (four) times daily -  before meals and at bedtime. Patient not taking: Reported on 08/28/2016 08/09/16   Donita Brooks, MD  LATUDA 40 MG TABS tablet Take 40 mg by mouth 2 (two) times daily.  05/22/16   Historical Provider, MD  Ospemifene (OSPHENA) 60 MG TABS Take 1 tablet by mouth at bedtime. Patient not taking: Reported on 08/28/2016 01/15/16   Dorena Bodo, PA-C    Family History Family History  Problem Relation Age of Onset  . Arthritis Mother   . Asthma  Mother   . Hearing loss Mother   . Hyperlipidemia Mother   . Hypertension Mother   . Miscarriages / [place] Mother   . Stroke Mother   . Hearing loss Father   . Hypertension Father   . Stroke Father   . Cancer Brother     leukemia  . Early death Brother   . Arthritis Maternal Grandmother   . Depression Maternal Grandmother   . Diabetes Maternal Grandmother   . Heart disease Maternal Grandmother     CHF  . Miscarriages / Stillbirths Maternal Grandmother   . Vision loss Maternal Grandmother   . Alcohol abuse Maternal Grandfather   . Arthritis Paternal Grandmother   . Heart disease Paternal Grandmother     massive heart attack  . Miscarriages / Stillbirths Paternal Grandmother   . Alcohol abuse Paternal Grandfather   . Stroke Paternal Grandfather   . Heart disease Paternal Grandfather     arteriosclerosis    Social History Social History  Substance Use Topics  . Smoking status: Never Smoker  . Smokeless tobacco: Never Used  . Alcohol use No     Allergies   Milk-related compounds; Norco [hydrocodone-acetaminophen]; and Oxycodone-acetaminophen   Review of Systems Review of Systems  Constitutional: Negative for fever.  HENT: Negative for sore throat.   Eyes: Negative for visual disturbance.  Respiratory: Negative for cough and shortness of breath.   Cardiovascular: Negative for chest pain.  Gastrointestinal: Positive for diarrhea (since getting  here this AM). Negative for abdominal pain.  Genitourinary: Positive for vaginal pain (outbreak of genital herpes secondary to stress, on meds). Negative for difficulty urinating.  Musculoskeletal: Negative for back pain and neck pain.  Skin: Negative for rash.  Neurological: Negative for syncope and headaches.  Psychiatric/Behavioral: Positive for suicidal ideas. Negative for self-injury.     Physical Exam Updated Vital Signs BP 152/87   Pulse (!) 57   Temp 98.1 F (36.7 C) (Oral)   Resp 16   SpO2 97%   Physical Exam  Constitutional: [name] oriented to person, place, and time. She appears well-developed and well-nourished. No distress.  tearful  HENT:  Head: Normocephalic and atraumatic.  Eyes: Conjunctivae and EOM are normal.  Neck: Normal range of motion.  Cardiovascular: Normal rate, regular rhythm, normal heart sounds and intact distal pulses.  Exam reveals no gallop and no friction rub.   No murmur heard. Pulmonary/Chest: Effort normal and breath sounds normal. No respiratory distress. She has no wheezes. She has no rales.  Abdominal: Soft. She exhibits no distension. There is no tenderness. There is no guarding.  Musculoskeletal: She exhibits no edema or tenderness.  Neurological: [name] alert and oriented to person, place, and time.  Skin: Skin is warm and dry. No rash noted. [name] not diaphoretic. No erythema.  Psychiatric: She exhibits a depressed mood. She expresses suicidal ideation.  Nursing note and vitals reviewed.    ED Treatments / Results  Labs (all labs ordered are listed, but only abnormal results are displayed) Labs Reviewed  COMPREHENSIVE METABOLIC PANEL - Abnormal; Notable for the following:       Result Value   Chloride 112 (*)    CO2 19 (*)    Total Protein 6.3 (*)    ALT 12 (*)    All other components within normal limits  ACETAMINOPHEN LEVEL - Abnormal; Notable for the following:    Acetaminophen (Tylenol), Serum <10 (*)    All other  components within normal limits  COMPREHENSIVE  METABOLIC PANEL - Abnormal; Notable for the following:    Glucose, Bld 100 (*)    ALT 13 (*)    All other components within normal limits  URINE RAPID DRUG SCREEN, HOSP PERFORMED - Abnormal; Notable for the following:    Benzodiazepines POSITIVE (*)    Barbiturates POSITIVE (*)    All other components within normal limits  CBC WITH DIFFERENTIAL/PLATELET  ETHANOL  SALICYLATE LEVEL    EKG  EKG Interpretation None       Radiology No results found.  Procedures Procedures (including critical care time)  Medications Ordered in ED Medications  metroNIDAZOLE (FLAGYL) tablet 500 mg (500 mg Oral Given 08/27/16 2000)     Initial Impression / Assessment and Plan / ED Course  I have reviewed the triage vital signs and the nursing notes.  Pertinent labs & imaging results that were available during my care of the patient were reviewed by me and considered in my medical decision making (see chart for details).  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 Patient feeling overwhelmed, and while she denies current SI, reports she has had thoughts recently and is very depressed, tearful, hopeless. Pt under therapy for recent HSV2 outbreak. BP elevated however no signs of htn emergency, home meds ordered.   Patient is medically cleared.  TTS consulted. She will be voluntary admission to [facility]. Home meds ordered.   Final Clinical Impressions(s) / ED Diagnoses   Final diagnoses:  Depression    New Prescriptions Discharge Medication List as of 08/28/2016  1:51 AM       Alvira Monday, MD 08/28/16 1117

## 2016-08-27 NOTE — Telephone Encounter (Signed)
rx faxed to pharmacy

## 2016-08-27 NOTE — ED Notes (Signed)
Dr. Dalene SeltzerSchlossman made aware of delay in [facility].  Advised to give Toprol and recheck BP.

## 2016-08-27 NOTE — ED Notes (Signed)
Pt given water per request

## 2016-08-27 NOTE — ED Notes (Addendum)
Pt in Peds triage with a sitter

## 2016-08-27 NOTE — ED Notes (Signed)
Pt was seen by EDP, pt is in purple scrubs, tearful, asking for her latuda.  Pharmacy notified

## 2016-08-27 NOTE — ED Notes (Addendum)
Voluntary admission and consent for treatment and consent to release information signed by patient and this nurse [facility] and sent copy to medical records.

## 2016-08-28 ENCOUNTER — Observation Stay (HOSPITAL_COMMUNITY)
Admission: AD | Admit: 2016-08-28 | Discharge: 2016-08-29 | Disposition: A | Discharge status: Home / Self Care | Source: Intra-hospital | Attending: Psychiatry | Admitting: Psychiatry

## 2016-08-28 ENCOUNTER — Encounter (HOSPITAL_COMMUNITY): Payer: Self-pay

## 2016-08-28 DIAGNOSIS — G4700 Insomnia, unspecified: Secondary | ICD-10-CM | POA: Insufficient documentation

## 2016-08-28 DIAGNOSIS — K219 Gastro-esophageal reflux disease without esophagitis: Secondary | ICD-10-CM | POA: Diagnosis not present

## 2016-08-28 DIAGNOSIS — Z91011 Allergy to milk products: Secondary | ICD-10-CM | POA: Diagnosis not present

## 2016-08-28 DIAGNOSIS — J45909 Unspecified asthma, uncomplicated: Secondary | ICD-10-CM | POA: Insufficient documentation

## 2016-08-28 DIAGNOSIS — R7303 Prediabetes: Secondary | ICD-10-CM | POA: Insufficient documentation

## 2016-08-28 DIAGNOSIS — E785 Hyperlipidemia, unspecified: Secondary | ICD-10-CM | POA: Insufficient documentation

## 2016-08-28 DIAGNOSIS — F419 Anxiety disorder, unspecified: Secondary | ICD-10-CM | POA: Insufficient documentation

## 2016-08-28 DIAGNOSIS — I10 Essential (primary) hypertension: Secondary | ICD-10-CM | POA: Diagnosis not present

## 2016-08-28 DIAGNOSIS — Z885 Allergy status to narcotic agent status: Secondary | ICD-10-CM | POA: Insufficient documentation

## 2016-08-28 DIAGNOSIS — F3162 Bipolar disorder, current episode mixed, moderate: Secondary | ICD-10-CM | POA: Diagnosis present

## 2016-08-28 MED ORDER — CLOBETASOL PROPIONATE 0.05 % EX SOLN
1.0000 "application " | Freq: Every evening | CUTANEOUS | Status: DC | PRN
  Filled 2016-08-28: qty 50

## 2016-08-28 MED ORDER — HYDROXYZINE HCL 25 MG PO TABS: 25.0000 mg | ORAL_TABLET | Freq: Three times a day (TID) | ORAL | Status: DC | PRN

## 2016-08-28 MED ORDER — LURASIDONE HCL 40 MG PO TABS: 40.0000 mg | ORAL_TABLET | Freq: Every day | ORAL | Status: DC

## 2016-08-28 MED ORDER — LURASIDONE HCL 40 MG PO TABS
40.0000 mg | ORAL_TABLET | Freq: Every day | ORAL | Status: DC
  Administered 2016-08-29: 40 mg via ORAL
  Filled 2016-08-28: qty 1

## 2016-08-28 MED ORDER — ACYCLOVIR 5 % EX CREA
TOPICAL_CREAM | CUTANEOUS | Status: DC
  Administered 2016-08-28: 18:00:00 via TOPICAL
  Administered 2016-08-28: 1 via TOPICAL
  Administered 2016-08-28: 22:00:00 via TOPICAL
  Administered 2016-08-28 – 2016-08-29 (×3): 1 via TOPICAL
  Administered 2016-08-29: 06:00:00 via TOPICAL
  Filled 2016-08-28 (×2): qty 5

## 2016-08-28 MED ORDER — LAMOTRIGINE 25 MG PO TABS
50.0000 mg | ORAL_TABLET | Freq: Every day | ORAL | Status: DC
Start: 2016-08-28 — End: 2016-08-28
  Administered 2016-08-28: 50 mg via ORAL
  Filled 2016-08-28: qty 2

## 2016-08-28 MED ORDER — ALUM & MAG HYDROXIDE-SIMETH 200-200-20 MG/5ML PO SUSP: 30.0000 mL | ORAL | Status: DC | PRN

## 2016-08-28 MED ORDER — CELECOXIB 100 MG PO CAPS: 200.0000 mg | ORAL_CAPSULE | ORAL | Status: DC | PRN

## 2016-08-28 MED ORDER — ONDANSETRON 4 MG PO TBDP
4.0000 mg | ORAL_TABLET | Freq: Three times a day (TID) | ORAL | Status: DC | PRN
  Administered 2016-08-28 – 2016-08-29 (×4): 4 mg via ORAL
  Filled 2016-08-28 (×5): qty 1

## 2016-08-28 MED ORDER — TRAMADOL HCL 50 MG PO TABS
50.0000 mg | ORAL_TABLET | Freq: Four times a day (QID) | ORAL | Status: DC | PRN
  Administered 2016-08-29: 50 mg via ORAL
  Filled 2016-08-28: qty 1

## 2016-08-28 MED ORDER — METHOCARBAMOL 500 MG PO TABS
500.0000 mg | ORAL_TABLET | Freq: Four times a day (QID) | ORAL | Status: DC
  Administered 2016-08-28 – 2016-08-29 (×6): 500 mg via ORAL
  Filled 2016-08-28 (×6): qty 1

## 2016-08-28 MED ORDER — VALACYCLOVIR HCL 500 MG PO TABS: 500.0000 mg | ORAL_TABLET | Freq: Two times a day (BID) | ORAL | Status: DC

## 2016-08-28 MED ORDER — FLUTICASONE PROPIONATE 50 MCG/ACT NA SUSP
2.0000 | Freq: Every day | NASAL | Status: DC
  Administered 2016-08-29: 2 via NASAL
  Filled 2016-08-28 (×2): qty 16

## 2016-08-28 MED ORDER — ZOLPIDEM TARTRATE 5 MG PO TABS: 5.0000 mg | ORAL_TABLET | Freq: Every evening | ORAL | Status: DC | PRN

## 2016-08-28 MED ORDER — METOPROLOL SUCCINATE ER 50 MG PO TB24
100.0000 mg | ORAL_TABLET | Freq: Every day | ORAL | Status: DC
  Administered 2016-08-28: 100 mg via ORAL
  Filled 2016-08-28: qty 2

## 2016-08-28 MED ORDER — LURASIDONE HCL 40 MG PO TABS
20.0000 mg | ORAL_TABLET | Freq: Every day | ORAL | Status: DC
  Administered 2016-08-28: 20 mg via ORAL
  Filled 2016-08-28: qty 1

## 2016-08-28 MED ORDER — HYDROXYZINE HCL 50 MG PO TABS
50.0000 mg | ORAL_TABLET | Freq: Three times a day (TID) | ORAL | Status: DC | PRN
  Administered 2016-08-28 (×2): 50 mg via ORAL
  Filled 2016-08-28 (×2): qty 1

## 2016-08-28 MED ORDER — DIPHENOXYLATE-ATROPINE 2.5-0.025 MG PO TABS: 1.0000 | ORAL_TABLET | Freq: Four times a day (QID) | ORAL | Status: DC | PRN

## 2016-08-28 MED ORDER — VALACYCLOVIR HCL 500 MG PO TABS
500.0000 mg | ORAL_TABLET | Freq: Three times a day (TID) | ORAL | Status: DC
  Administered 2016-08-28 – 2016-08-29 (×5): 500 mg via ORAL
  Filled 2016-08-28 (×6): qty 1

## 2016-08-28 MED ORDER — LAMOTRIGINE 25 MG PO TABS
25.0000 mg | ORAL_TABLET | Freq: Every day | ORAL | Status: DC
  Administered 2016-08-29: 25 mg via ORAL
  Filled 2016-08-28: qty 1

## 2016-08-28 MED ORDER — MAGNESIUM OXIDE 400 (241.3 MG) MG PO TABS
400.0000 mg | ORAL_TABLET | Freq: Every day | ORAL | Status: DC
  Administered 2016-08-29: 400 mg via ORAL
  Filled 2016-08-28 (×4): qty 1

## 2016-08-28 MED ORDER — LURASIDONE HCL 20 MG PO TABS
60.0000 mg | ORAL_TABLET | Freq: Every day | ORAL | Status: DC
  Filled 2016-08-28 (×2): qty 3

## 2016-08-28 MED ORDER — TRAZODONE HCL 50 MG PO TABS: 50.0000 mg | ORAL_TABLET | Freq: Every evening | ORAL | Status: DC | PRN

## 2016-08-28 MED ORDER — ALBUTEROL SULFATE HFA 108 (90 BASE) MCG/ACT IN AERS: 2.0000 | INHALATION_SPRAY | Freq: Four times a day (QID) | RESPIRATORY_TRACT | Status: DC | PRN

## 2016-08-28 MED ORDER — LURASIDONE HCL 20 MG PO TABS
40.0000 mg | ORAL_TABLET | Freq: Every day | ORAL | Status: DC
  Administered 2016-08-28: 40 mg via ORAL
  Filled 2016-08-28 (×3): qty 2

## 2016-08-28 MED ORDER — MAGNESIUM HYDROXIDE 400 MG/5ML PO SUSP: 30.0000 mL | Freq: Every day | ORAL | Status: DC | PRN

## 2016-08-28 MED ORDER — ONDANSETRON 4 MG PO TBDP
8.0000 mg | ORAL_TABLET | Freq: Once | ORAL | Status: AC
  Administered 2016-08-28: 8 mg via ORAL
  Filled 2016-08-28: qty 2

## 2016-08-28 NOTE — [facility] Counselor (Signed)
This Clinical research associatewriter called Dr. Alvino ChapelEllen C. Wilson's office to follow-up on the patients claim that she had made an appointment with her psychologist. This writer was informed by a representative of Dr. Alvino ChapelEllen C. Andrey CampanileWilson that the patient's appointment is Thursday, October 5th at 11:00 am with Dr. Walker KehrEllen C Wilson.

## 2016-08-28 NOTE — Progress Notes (Signed)
D:  Patient anxious and depressed; denies suicidal and homicidal ideation and AVH;  No self-injurious behaviors noted. [name] cooperative with plan of care. A:  Medication given as scheduled; emotional support provided; encouraged her to seek assistance with needs/concerns. R:  Safety maintained on unit.

## 2016-08-28 NOTE — Progress Notes (Signed)
Pt continues to verbalize feelings of anxiety, and depression.  Pt concerned about returning home to non supportive environment.  Pt denies SI, HI and AVH.  Pt sts not pain at this time.  No self injurious behaviors noted.  Pt is cooperative and compliant. Pt given emotional support and encouragement. Pt continuously observed for safety while on unit except when in bathroom.
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
## 2016-08-28 NOTE — ED Notes (Signed)
Pt asking for lomotil as she is leaving with [place], noted in daily meds but not ordered in ED.
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
## 2016-08-28 NOTE — Progress Notes (Addendum)
Adult Services Patient-Family Contact/Session  Attendees:  Spoke with patient's daughter Suzette BattiestVeronica on phone (Patient provided written/and verbal consent to speak with her daughter regarding her condition)  Goal(s):  Patient safety at home  Safety Concerns:  Firearms in home  Narrative:  Spoke with patient's daughter regarding patient safety at home and suicide prevention.  Patient states there are firearms in her home but that the husband had locked them up in a gun safe and he had the key.  Daughter confirmed that firearms were secured in gun safe and that her father had the key.  She voiced no concerns regarding patient safety at home; information regarding suicide prevention and resources provided.  Interventions: Firearms locked up in gun safe and patient's husband has key  Recommendation(s):  If patient has suicidal ideation you should call 911 and/or contact [facility] or call Suicide Prevention Hotline.   Camelia EngKaren H Maryland Stell 08/28/2016, 5:43 PM

## 2016-08-29 DIAGNOSIS — F3162 Bipolar disorder, current episode mixed, moderate: Secondary | ICD-10-CM | POA: Diagnosis not present

## 2016-08-29 MED ORDER — LAMOTRIGINE 25 MG PO TABS: 25.0000 mg | ORAL_TABLET | Freq: Every day | ORAL | 0 refills | Status: DC

## 2016-08-29 MED ORDER — ZOLPIDEM TARTRATE 5 MG PO TABS: 5.0000 mg | ORAL_TABLET | Freq: Every evening | ORAL | 0 refills | Status: DC | PRN

## 2016-08-29 NOTE — Progress Notes (Signed)
D: Pt A & O X4. Denies SI, HI and AVH. Presents anxious. Ambulatory with slow but steady gait. Pt discharged from Observation Unit as ordered. Pt was picked up in the lobby by her father.  A: Emotional support and availability provided to pt. Encouraged to voice concerns and comply with d/c instructions. Scheduled and PRN medications administered as prescribed. D/C instructions, prescriptions and AVS reviewed with pt. Pt had no belongings in locker upon admission. Safety maintained in Observation unit till time of d/c without self harm gestures or outburst to note at present.  R: Pt receptive to care. Compliant with medications as ordered. Denies adverse drug reactions. Verbalized understanding related to d/c instructions. Signed belonging sheet in agreement that she had no items at time of admission. No physical distress evident at time of d/c.

## 2016-08-29 NOTE — [facility] Counselor (Signed)
This Clinical research associatewriter received a telephone return call back from Dr. Alvino ChapelEllen C. Wilson in reference to scheduling an earlier appointment for this pt. This Clinical research associatewriter was informed that Dr. Andrey CampanileWilson will be out of town on next week and it she has any cancellations before September 12, 2016 she would make contact with this pt. This Clinical research associatewriter informed Dr. Andrey CampanileWilson that ab [facility] has been [facility] Wednesday, September 04, 2016 @ 8:45am with Jeri Modenaita Clark. Pt will be expected to bring her insurance card on the day of appointment.

## 2016-08-29 NOTE — [facility] Counselor (Signed)
This Clinical research associatewriter spoke with this pt in regards to scheduling an earlier follow up appointment with her Psychologist Alvino Chapel(Ellen C. Wilson). This Clinical research associatewriter called to Dr. Alvino ChapelEllen C. Wilson's office and left a voice mail for a return call.
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
## 2016-09-09 ENCOUNTER — Other Ambulatory Visit: Payer: Self-pay | Admitting: Physician Assistant

## 2016-09-09 DIAGNOSIS — R42 Dizziness and giddiness: Secondary | ICD-10-CM

## 2016-09-09 NOTE — Telephone Encounter (Signed)
RX refilled per protocol 

## 2016-09-18 ENCOUNTER — Other Ambulatory Visit: Payer: Self-pay | Admitting: Physician Assistant

## 2016-09-18 ENCOUNTER — Other Ambulatory Visit: Payer: Self-pay | Admitting: Family Medicine

## 2016-09-18 NOTE — Telephone Encounter (Signed)
Ok to refill 

## 2016-09-19 NOTE — Telephone Encounter (Signed)
Approved #[id number] 

## 2016-09-19 NOTE — Telephone Encounter (Signed)
Prescription faxed

## 2016-10-22 ENCOUNTER — Encounter: Payer: Self-pay | Admitting: Family Medicine

## 2016-10-22 ENCOUNTER — Ambulatory Visit (INDEPENDENT_AMBULATORY_CARE_PROVIDER_SITE_OTHER): Payer: TRICARE For Life (TFL) | Admitting: Family Medicine

## 2016-10-22 VITALS — BP 138/84 | HR 56 | Temp 98.1°F | Resp 14 | Ht 66.0 in | Wt 190.0 lb

## 2016-10-22 DIAGNOSIS — J4520 Mild intermittent asthma, uncomplicated: Secondary | ICD-10-CM

## 2016-10-22 DIAGNOSIS — B852 Pediculosis, unspecified: Secondary | ICD-10-CM

## 2016-10-22 MED ORDER — PERMETHRIN 5 % EX CREA: 1.0000 "application " | TOPICAL_CREAM | Freq: Once | CUTANEOUS | 1 refills | Status: AC

## 2016-10-22 MED ORDER — HYDROCODONE-HOMATROPINE 5-1.5 MG/5ML PO SYRP: 5.0000 mL | ORAL_SOLUTION | Freq: Three times a day (TID) | ORAL | 0 refills | Status: DC | PRN

## 2016-10-22 NOTE — Progress Notes (Signed)
   Subjective:    Patient ID: Peggy Fitzgerald, female    DOB: 1956-02-26, 60 y.o.   MRN: [id number]  Patient presents for Scalp Irritation (x1 week- positive exposure to lice from family- scalp itching- has used OTC meds with no relief) and Ear Pain (R ear pressure and pain- thinks she may be getting ear infection)   Pt here with lice, family has lice,Did not use over-the-counter treatment is that she was doing home remedies of vinegar and coming for her hair. She also has underlying flaky scalp     Right ear pain and pressure started today, also has occcough, has mild asthma rare use of albuterol, requested refill on hycodan she uses during the winter, [place] last year, she moved here 1 year ago, has not needed thus far    Review Of Systems:  GEN- denies fatigue, fever, weight loss,weakness, recent illness HEENT- denies eye drainage, change in vision, nasal discharge, CVS- denies chest pain, palpitations RESP- denies SOB, +cough, wheeze ABD- denies N/V, change in stools, abd pain GU- denies dysuria, hematuria, dribbling, incontinence MSK- denies joint pain, muscle aches, injury Neuro- denies headache, dizziness, syncope, seizure activity       Objective:    BP 138/84 (BP Location: Left Arm, Patient Position: Sitting, Cuff Size: Large)   Pulse (!) 56   Temp 98.1 F (36.7 C) (Oral)   Resp 14   Ht 5\' 6"  (1.676 m)   Wt 190 lb (86.2 kg)   SpO2 98%   BMI 30.67 kg/m  GEN- NAD, alert and oriented x3 HEENT- PERRL, EOMI, non injected sclera, pink conjunctiva, MMM, oropharynx clear , TM clear bilat canals clear, nares clear  Neck- Supple, no LAD  CVS- RRR, no murmur RESP-CTAB, coughing during exam Skin- dandruff noted, also nits throughout hair          Assessment & Plan:      Problem List Items Addressed This Visit    Asthma    Given refill on cough medicine, she has inhaler       Other Visit Diagnoses    Lice infestation    -  Primary   Treat with permethrin and repeat in 1 week      Note: This dictation was prepared with Dragon dictation along with smaller phrase technology. Any transcriptional errors that result from this process are unintentional.

## 2016-10-22 NOTE — Patient Instructions (Signed)
Use as directed for lice Okay to use nasal saline, your nasal spray, mucinex,  Cough medicine prescribed F/U as needed

## 2016-10-23 NOTE — Assessment & Plan Note (Signed)
Given refill on cough medicine, she has inhaler

## 2016-12-12 ENCOUNTER — Other Ambulatory Visit: Payer: Self-pay | Admitting: Physician Assistant

## 2016-12-16 ENCOUNTER — Telehealth: Payer: Self-pay | Admitting: Physician Assistant

## 2016-12-16 NOTE — Telephone Encounter (Signed)
Patient calling to get rx for diflucan called to pharmacy if possible  [facility] [place]  [phone]

## 2016-12-17 NOTE — Telephone Encounter (Signed)
Returned pt call lvmtcb. Pt would need to sch an Ov she was last seen 08-21-2016 for this problem.

## 2016-12-19 ENCOUNTER — Encounter: Payer: Self-pay | Admitting: Physician Assistant

## 2016-12-19 ENCOUNTER — Ambulatory Visit (INDEPENDENT_AMBULATORY_CARE_PROVIDER_SITE_OTHER): Admitting: Physician Assistant

## 2016-12-19 VITALS — BP 130/86 | HR 60 | Temp 98.0°F | Resp 16 | Wt 195.6 lb

## 2016-12-19 DIAGNOSIS — N760 Acute vaginitis: Secondary | ICD-10-CM | POA: Diagnosis not present

## 2016-12-19 DIAGNOSIS — B9689 Other specified bacterial agents as the cause of diseases classified elsewhere: Secondary | ICD-10-CM | POA: Diagnosis not present

## 2016-12-19 LAB — WET PREP FOR TRICH, YEAST, CLUE
Trich, Wet Prep: NONE SEEN
YEAST WET PREP: NONE SEEN

## 2016-12-19 MED ORDER — METRONIDAZOLE 500 MG PO TABS: 500.0000 mg | ORAL_TABLET | Freq: Two times a day (BID) | ORAL | 0 refills | Status: DC

## 2016-12-19 NOTE — Progress Notes (Signed)
Patient ID: Peggy Fitzgerald MRN: [id number], DOB: Jan 05, 1956, 61 y.o. Date of Encounter: 12/19/2016, 11:33 AM    Chief Complaint:  Chief Complaint  Patient presents with  . Vaginal Itching  . odor     HPI: 61 y.o. year old female presents with above. She has been having vaginal itching and irritation. Has not noticed much discharge. No pelvic pain. No fevers or chills. Is been you been using some over-the-counter treatment for yeast infections but symptoms lingering.     Home Meds:   Outpatient Medications Prior to Visit  Medication Sig Dispense Refill  . albuterol (PROVENTIL HFA;VENTOLIN HFA) 108 (90 Base) MCG/ACT inhaler Inhale 2 puffs into the lungs every 6 (six) hours as needed for wheezing or shortness of breath.    . Butalbital-APAP-Caffeine 50-300-40 MG CAPS TAKE 1 TO 2 CAPSULES EVERY 4 HOURS AS NEEDED (NO MORE THAN 6 CAPSULES PER DAY) 90 capsule 1  . celecoxib (CELEBREX) 200 MG capsule TAKE 1 CAPSULE AS NEEDED 90 capsule 1  . Cholecalciferol (VITAMIN D3) 5000 units CAPS Take 5,000 Units by mouth daily.     . clobetasol (TEMOVATE) 0.05 % external solution Apply 1 application topically 2 (two) times daily. (Patient taking differently: Apply 1 application topically at bedtime as needed (for scalp itch). ) 150 mL 3  . diphenoxylate-atropine (LOMOTIL) 2.5-0.025 MG tablet Take 1 tablet by mouth as needed for diarrhea or loose stools. 30 tablet 5  . Flaxseed, Linseed, (FLAXSEED OIL MAX STR) 1300 MG CAPS Take 1,300 mg by mouth 2 (two) times daily.     Marland Kitchen. HYDROcodone-homatropine (HYCODAN) 5-1.5 MG/5ML syrup Take 5 mLs by mouth every 8 (eight) hours as needed for cough. 140 mL 0  . hydrOXYzine (ATARAX/VISTARIL) 25 MG tablet Take 1 tablet (25 mg total) by mouth 3 (three) times daily as needed. (Patient taking differently: Take 25 mg by mouth 3 (three) times daily as needed for itching. ) 30 tablet 1  . lamoTRIgine (LAMICTAL) 25 MG tablet Take 1 tablet (25 mg total) by mouth daily. 14  tablet 0  . LATUDA 40 MG TABS tablet Take 40 mg by mouth 2 (two) times daily.     . Magnesium 400 MG TABS Take 400 mg by mouth daily.     . meclizine (ANTIVERT) 25 MG tablet TAKE 1 TABLET AS NEEDED FOR DIZZINESS 90 tablet 0  . methocarbamol (ROBAXIN) 500 MG tablet Take 500 mg by mouth 4 (four) times daily.    . mometasone (NASONEX) 50 MCG/ACT nasal spray Place 2 sprays into the nose daily as needed (for allergies).     . promethazine (PHENERGAN) 25 MG tablet TAKE 1 TABLET EVERY 6 HOURS AS NEEDED FOR NAUSEA OR VOMITING 90 tablet 0  . TOPROL XL 100 MG 24 hr tablet TAKE 1 TABLET AT BEDTIME 90 tablet 1  . traMADol (ULTRAM) 50 MG tablet Take 50 mg by mouth every 6 (six) hours as needed for moderate pain.     . valACYclovir (VALTREX) 500 MG tablet Take 1 tablet (500 mg total) by mouth daily. (Patient taking differently: Take 500 mg by mouth 2 (two) times daily. ) 90 tablet 1  . zolpidem (AMBIEN) 5 MG tablet Take 1 tablet (5 mg total) by mouth at bedtime as needed for sleep. 14 tablet 0  . ZOVIRAX 5 % APPLY 1 APPLICATION TOPICALLY AS NEEDED (Patient taking differently: APPLY 1 APPLICATION TOPICALLY twice daily AS NEEDED for outbreaks) 15 g 3   No facility-administered medications prior to visit.  Allergies:  Allergies  Allergen Reactions  . Milk-Related Compounds Diarrhea    Cannot drink plain milk, but can eat ice cream, and milk in cereal  . Norco [Hydrocodone-Acetaminophen] Other (See Comments)    Headaches  . Oxycodone-Acetaminophen Itching    Nightmares      Review of Systems: See HPI for pertinent ROS. All other ROS negative.    Physical Exam: Blood pressure 130/86, pulse 60, temperature 98 F (36.7 C), temperature source Oral, resp. rate 16, weight 195 lb 9.6 oz (88.7 kg), SpO2 98 %., Body mass index is 31.57 kg/m. General: WNWD WF  Appears in no acute distress.  Neck: Supple. No thyromegaly. No lymphadenopathy. Lungs: Clear bilaterally to auscultation without wheezes, rales,  or rhonchi. Breathing is unlabored. Heart: Regular rhythm. No murmurs, rubs, or gallops. Msk:  Strength and tone normal for age. Pelvic Exam: External genitalia normal. Vaginal mucosa normal. See no significant erythema on the vulva or vaginal mucosa. No lesions. Cervix is normal. There is small amount of white discharge present. Bimanual exam normal with no masses or tenderness. Extremities/Skin: Warm and dry.  Neuro: Alert and oriented X 3. Moves all extremities spontaneously. Gait is normal. CNII-XII grossly in tact. Psych:  Responds to questions appropriately with a normal affect.     ASSESSMENT AND PLAN:  61 y.o. year old female with    1. BV (bacterial vaginosis) [name] to take the Flagyl as directed. Recommend eat yogurt/take probiotic to re-establish proper balance/flora. - metroNIDAZOLE (FLAGYL) 500 MG tablet; Take 1 tablet (500 mg total) by mouth 2 (two) times daily.  Dispense: 14 tablet; Refill: 0  2. Vaginitis and vulvovaginitis - GC/Chlamydia Probe Amp - WET PREP FOR TRICH, YEAST, CLUE   Signed, [address] [place], [place], [facility] 12/19/2016 11:33 AM

## 2016-12-25 LAB — GC/CHLAMYDIA PROBE AMP
CT PROBE, AMP APTIMA: NOT DETECTED
GC PROBE AMP APTIMA: NOT DETECTED

## 2017-02-11 ENCOUNTER — Other Ambulatory Visit: Payer: Self-pay | Admitting: Family Medicine

## 2017-02-11 ENCOUNTER — Other Ambulatory Visit: Payer: Self-pay | Admitting: Physician Assistant

## 2017-02-11 DIAGNOSIS — R42 Dizziness and giddiness: Secondary | ICD-10-CM

## 2017-02-12 NOTE — Telephone Encounter (Signed)
Refill appropriate 

## 2017-03-18 ENCOUNTER — Other Ambulatory Visit: Payer: Self-pay | Admitting: Physician Assistant

## 2017-03-18 NOTE — Telephone Encounter (Signed)
Medication refill appropriate  

## 2017-04-03 ENCOUNTER — Encounter (INDEPENDENT_AMBULATORY_CARE_PROVIDER_SITE_OTHER): Payer: Self-pay

## 2017-04-03 ENCOUNTER — Encounter: Payer: Self-pay | Admitting: Adult Health

## 2017-04-03 ENCOUNTER — Ambulatory Visit (INDEPENDENT_AMBULATORY_CARE_PROVIDER_SITE_OTHER): Payer: TRICARE For Life (TFL) | Admitting: Adult Health

## 2017-04-03 VITALS — BP 130/82 | HR 62 | Ht 65.5 in | Wt 203.5 lb

## 2017-04-03 DIAGNOSIS — B3731 Acute candidiasis of vulva and vagina: Secondary | ICD-10-CM

## 2017-04-03 DIAGNOSIS — B373 Candidiasis of vulva and vagina: Secondary | ICD-10-CM

## 2017-04-03 DIAGNOSIS — N898 Other specified noninflammatory disorders of vagina: Secondary | ICD-10-CM | POA: Diagnosis not present

## 2017-04-03 LAB — POCT WET PREP (WET MOUNT)

## 2017-04-03 MED ORDER — FLUCONAZOLE 150 MG PO TABS: ORAL_TABLET | ORAL | 1 refills | Status: DC

## 2017-04-03 NOTE — Progress Notes (Signed)
Subjective:     Patient ID: Peggy Fitzgerald, female   DOB: 07-Apr-1956, 61 y.o.   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  Review of Systems +vaginal irritation Vaginal dryness Sex hurts at times Reviewed past medical,surgical, social and family history. Reviewed medications and allergies.     Objective:   Physical Exam BP 130/82 (BP Location: Left Arm, Patient Position: Sitting, Cuff Size: Large)   Pulse 62   Ht 5' 5.5" (1.664 m)   Wt 203 lb 8 oz (92.3 kg)   BMI 33.35 kg/m  Skin warm and dry.Pelvic: external genitalia is normal in appearance no lesions, has white discharge under clitoral hood, vagina: white discharge without odor, skin has loss of rugae,urethra has no lesions or masses noted, cervix:smooth, uterus: normal size, shape and contour, non tender, no masses felt, adnexa: no masses or tenderness noted. Bladder is non tender and no masses felt. Wet prep: + for yeast and +WBCs.   PHQ 9 score 21, is on meds and sees psychotherapist in [place] ,Dr Andrey Campanile and also Peggy Fitzgerald. Discussed try vaginal moisturizer and if not better then vaginal estrogen.  Assessment:     1. Vaginal irritation   2. Vaginal yeast infection   3. Vaginal discharge   4. Vaginal dryness       Plan:     Rx diflucan 150 mg #2 take 1 now and 1 in 3 days with 1 refill Try Luvena for vaginal moisture and astro glide with sex Follow up prn

## 2017-04-11 ENCOUNTER — Encounter: Payer: Self-pay | Admitting: Podiatry

## 2017-04-11 ENCOUNTER — Ambulatory Visit (INDEPENDENT_AMBULATORY_CARE_PROVIDER_SITE_OTHER): Admitting: Podiatry

## 2017-04-11 ENCOUNTER — Ambulatory Visit (INDEPENDENT_AMBULATORY_CARE_PROVIDER_SITE_OTHER): Payer: TRICARE For Life (TFL)

## 2017-04-11 DIAGNOSIS — M722 Plantar fascial fibromatosis: Secondary | ICD-10-CM | POA: Diagnosis not present

## 2017-04-11 NOTE — Progress Notes (Signed)
   Subjective:    Patient ID: Peggy Fitzgerald, female    DOB: 18-Aug-1956, 61 y.o.   MRN: [id number]  HPI 61 year old female presents the office today for concerns of right foot pain on the medial aspect of the foot which then ongoing for 2.5 weeks. She states that she's been taking Celebrex as well as wearing a sleeve for support to her foot. She previously has seen Dr. Wynelle ClevelandPetrinitz for plantar fasciitis and she has steroid injection which helped. She states the pain feels well cemented previously. She denies any recent injury or trauma denies he swelling or redness to the area. The pain does not wake her up at night. [name] no some minimal swelling along the heel. She has not changed increase her activity recently.   Review of Systems  Constitutional: Positive for unexpected weight change.  Gastrointestinal: Positive for nausea.  Musculoskeletal: Positive for arthralgias, back pain and gait problem.  Neurological: Positive for headaches.  All other systems reviewed and are negative.      Objective:   Physical Exam General: AAO x3, NAD  Dermatological: Skin is warm, dry and supple bilateral. Nails x 10 are well manicured; remaining integument appears unremarkable at this time. There are no open sores, no preulcerative lesions, no rash or signs of infection present.  Vascular: Dorsalis Pedis artery and Posterior Tibial artery pedal pulses are 2/4 bilateral with immedate capillary fill time.  There is no pain with calf compression, swelling, warmth, erythema.   Neruologic: Grossly intact via light touch bilateral. Vibratory intact via tuning fork bilateral. Protective threshold with Semmes Wienstein monofilament intact to all pedal sites bilateral. Negative tinel sign.   Musculoskeletal: Tenderness to palpation along the plantar medial tubercle of the calcaneus at the insertion of plantar fascia on the right foot. There is no pain along the course of the plantar fascia within the arch of the  foot. Plantar fascia appears to be intact. There is no pain with lateral compression of the calcaneus or pain with vibratory sensation. There is no pain along the course or insertion of the achilles tendon. No other areas of tenderness to bilateral lower extremities.Muscular strength 5/5 in all groups tested bilateral. Trace swelling to the medial heel.  Gait: Unassisted, Nonantalgic.      Assessment & Plan:  61 year old female with right heel pain likely plantar fasciitis -Treatment options discussed including all alternatives, risks, and complications -Etiology of symptoms were discussed -X-rays were obtained and reviewed with the patient. No evidence of acute fracture identified. -Patient elects to proceed with steroid injection into the right heel. Under sterile skin preparation, a total of 2.5cc of kenalog 10, 0.5% Marcaine plain, and 2% lidocaine plain were infiltrated into the symptomatic area without complication. A band-aid was applied. Patient tolerated the injection well without complication. Post-injection care with discussed with the patient. Discussed with the patient to ice the area over the next couple of days to help prevent a steroid flare.  -Plantar fascial brace -Stretching, icing daily. -Discussed supportive shoe gear and orthotics -Follow-up in 3-4 weeks or sooner if any problems arise. In the meantime, encouraged to call the office with any questions, concerns, change in symptoms.   Ovid CurdMatthew Wagoner, DPM

## 2017-04-28 ENCOUNTER — Ambulatory Visit: Payer: TRICARE For Life (TFL) | Admitting: Podiatry

## 2017-05-29 ENCOUNTER — Ambulatory Visit: Admitting: Podiatry

## 2017-06-16 ENCOUNTER — Ambulatory Visit (INDEPENDENT_AMBULATORY_CARE_PROVIDER_SITE_OTHER): Admitting: Family Medicine

## 2017-06-16 ENCOUNTER — Encounter: Payer: Self-pay | Admitting: Family Medicine

## 2017-06-16 VITALS — BP 126/84 | HR 84 | Temp 98.4°F | Resp 18 | Ht 65.5 in | Wt 195.0 lb

## 2017-06-16 DIAGNOSIS — R7303 Prediabetes: Secondary | ICD-10-CM

## 2017-06-16 MED ORDER — PHENTERMINE HCL 37.5 MG PO CAPS
37.5000 mg | ORAL_CAPSULE | ORAL | 2 refills | Status: DC
Start: 2017-06-16 — End: 2018-03-20

## 2017-06-16 NOTE — Progress Notes (Signed)
Subjective:    Patient ID: Peggy Fitzgerald, female    DOB: 1956/11/14, 61 y.o.   MRN: [id number]  Medication Refill  Patient is a very pleasant 61 year old white female with a past medical history of prediabetes who reports a history of fruity smelling urine. She denies any polyuria, polydipsia, or blurry vision. There is no recent dramatic weight loss. She has a history of severe depression and possible bipolar. [name] being treated by psychiatrist. Delene Lollhey've recommended a sleep study. The patient does report fatigue. However she denies any hypersomnolence. She does not fall asleep driving a car. She does not fall asleep after meals. She does not fall asleep while driving. She denies any early morning headache. She does not fall asleep reading a book. Her Epworth sleepiness score is 3. She does not meet clinical criteria for a sleep study. Her biggest complaint is not hypersomnolence is much as it is fatigue. [name] also interested in using phentermine for weight loss. At the present time she is not engaging in any regular aerobic exercise due to a variety of orthopedic issues. There is no specific diet that she is following Past Medical History:  Diagnosis Date  . AC (acromioclavicular) joint bone spurs, right   . Allergy    seasonal  . Anxiety   . Asthma    asthmatic bronchitis  . Bipolar 1 disorder, depressed (HCC)   . Cataract 2007  . Depression   . GERD (gastroesophageal reflux disease)   . Herpes simplex type 2 infection   . Hyperlipidemia   . Hypertension   . Plantar fasciitis of right foot   . Prediabetes   . Rotator cuff tear    right shoulder   Past Surgical History:  Procedure Laterality Date  . CATARACT EXTRACTION, BILATERAL    . CHOLECYSTECTOMY  1987  . KNEE SURGERY Right    Current Outpatient Prescriptions on File Prior to Visit  Medication Sig Dispense Refill  . albuterol (PROVENTIL HFA;VENTOLIN HFA) 108 (90 Base) MCG/ACT inhaler Inhale 2 puffs into the lungs  every 6 (six) hours as needed for wheezing or shortness of breath.    . APPLE CIDER VINEGAR PO Take by mouth 3 (three) times daily.    . Butalbital-APAP-Caffeine 50-300-40 MG CAPS TAKE 1 TO 2 CAPSULES EVERY 4 HOURS AS NEEDED (NO MORE THAN 6 CAPSULES PER DAY) 90 capsule 1  . celecoxib (CELEBREX) 200 MG capsule TAKE 1 CAPSULE AS NEEDED 90 capsule 1  . Cholecalciferol (VITAMIN D3) 5000 units CAPS Take 5,000 Units by mouth daily.     . clobetasol (TEMOVATE) 0.05 % external solution Apply 1 application topically 2 (two) times daily. (Patient taking differently: Apply 1 application topically at bedtime as needed (for scalp itch). ) 150 mL 3  . diphenoxylate-atropine (LOMOTIL) 2.5-0.025 MG tablet Take 1 tablet by mouth as needed for diarrhea or loose stools. 30 tablet 5  . eszopiclone (LUNESTA) 2 MG TABS tablet Take 2 mg by mouth at bedtime.     . Flaxseed, Linseed, (FLAXSEED OIL MAX STR) 1300 MG CAPS Take 1,300 mg by mouth 2 (two) times daily.     . hydrOXYzine (ATARAX/VISTARIL) 25 MG tablet Take 1 tablet (25 mg total) by mouth 3 (three) times daily as needed. (Patient taking differently: Take 25 mg by mouth 4 (four) times daily as needed for itching. ) 30 tablet 1  . lamoTRIgine (LAMICTAL) 25 MG tablet Take 1 tablet (25 mg total) by mouth daily. (Patient taking differently: Take 25 mg  by mouth 2 (two) times daily. ) 14 tablet 0  . LATUDA 80 MG TABS tablet Take 80 mg by mouth daily.     . Magnesium 400 MG TABS Take 400 mg by mouth daily.     . meclizine (ANTIVERT) 25 MG tablet TAKE 1 TABLET AS NEEDED FOR DIZZINESS 90 tablet 0  . methocarbamol (ROBAXIN) 500 MG tablet Take 500 mg by mouth 4 (four) times daily.    . mometasone (NASONEX) 50 MCG/ACT nasal spray Place 2 sprays into the nose daily as needed (for allergies).     . promethazine (PHENERGAN) 25 MG tablet TAKE 1 TABLET EVERY 6 HOURS AS NEEDED FOR NAUSEA OR VOMITING 90 tablet 0  . TOPROL XL 100 MG 24 hr tablet TAKE 1 TABLET AT BEDTIME 90 tablet 1  .  traMADol (ULTRAM) 50 MG tablet Take 50 mg by mouth every 6 (six) hours as needed for moderate pain.     . valACYclovir (VALTREX) 500 MG tablet TAKE 1 TABLET DAILY 90 tablet 0  . ZOVIRAX 5 % APPLY 1 APPLICATION TOPICALLY AS NEEDED (Patient taking differently: APPLY 1 APPLICATION TOPICALLY twice daily AS NEEDED for outbreaks) 15 g 3   No current facility-administered medications on file prior to visit.    Allergies  Allergen Reactions  . Milk-Related Compounds Diarrhea    Cannot drink plain milk, but can eat ice cream, and milk in cereal  . Norco [Hydrocodone-Acetaminophen] Other (See Comments)    Headaches  . Oxycodone-Acetaminophen Itching    Nightmares   Social History   Social History  . Marital status: Married    Spouse name: N/A  . Number of children: N/A  . Years of education: N/A   Occupational History  . Not on file.   Social History Main Topics  . Smoking status: Never Smoker  . Smokeless tobacco: Never Used  . Alcohol use No  . Drug use: No  . Sexual activity: Not Currently    Birth control/ protection: Surgical     Comment: spouse vasectomy   Other Topics Concern  . Not on file   Social History Narrative  . No narrative on file      Review of Systems  All other systems reviewed and are negative.      Objective:   Physical Exam  Constitutional: [name] oriented to person, place, and time.  Cardiovascular: Normal rate, regular rhythm and normal heart sounds.   Pulmonary/Chest: Effort normal and breath sounds normal. No respiratory distress. She has no wheezes. She has no rales.  Abdominal: Soft. Bowel sounds are normal.  Neurological: [name] alert and oriented to person, place, and time. She has normal reflexes. No cranial nerve deficit. She exhibits normal muscle tone. Coordination normal.  Vitals reviewed.         Assessment & Plan:  Prediabetes - Plan: CBC with Differential/Platelet, COMPLETE METABOLIC PANEL WITH GFR, Lipid panel, Hemoglobin  A1c  Given her prediabetes, I will check a hemoglobin A1c to ensure that the change in her urine is not secondary to glucosuria. I will check a fasting lipid panel. Goal LDL cholesterol is less than 100. Goal hemoglobin A1c is less than 6. I recommended therapeutic lifestyle changes to address her weight loss as well as her prediabetes. I recommended 30 minutes to 60 minutes a day of aerobic exercise 5 days a week. We discussed using a rowing machine given her orthopedic issues. Also recommended a low carbohydrate diet. I'm willing for the patient to use phentermine  for the next 3 months to try to help jump start the process but I was clear that lifestyle changes have to be made in order for this to be successful and long-term. Her symptoms do not sound like sleep apnea. While I would be happy to refer the patient for sleep study I believe the majority of her fatigue is secondary to her depression she denies any hypersomnia. I recommended trying therapeutic lifestyle changes since his exercise and diet to help address her fatigue in addition to her antidepressants.

## 2017-06-17 LAB — COMPLETE METABOLIC PANEL WITH GFR
ALBUMIN: 4.2 g/dL (ref 3.6–5.1)
ALK PHOS: 85 U/L (ref 33–130)
ALT: 12 U/L (ref 6–29)
AST: 18 U/L (ref 10–35)
BUN: 13 mg/dL (ref 7–25)
CHLORIDE: 107 mmol/L (ref 98–110)
CO2: 24 mmol/L (ref 20–31)
Calcium: 9.2 mg/dL (ref 8.6–10.4)
Creat: 0.93 mg/dL (ref 0.50–0.99)
GFR, Est African American: 76 mL/min (ref >=60)
GFR, Est Non African American: 66 mL/min (ref >=60)
GLUCOSE: 94 mg/dL (ref 70–99)
POTASSIUM: 4.4 mmol/L (ref 3.5–5.3)
SODIUM: 141 mmol/L (ref 135–146)
Total Bilirubin: 0.3 mg/dL (ref 0.2–1.2)
Total Protein: 7.3 g/dL (ref 6.1–8.1)

## 2017-06-17 LAB — CBC WITH DIFFERENTIAL/PLATELET
BASOS ABS: 65 {cells}/uL (ref 0–200)
Basophils Relative: 1 %
EOS ABS: 130 {cells}/uL (ref 15–500)
EOS PCT: 2 %
HCT: 44.8 % (ref 35.0–45.0)
HEMOGLOBIN: 15 g/dL (ref 12.0–15.0)
Lymphocytes Relative: 23 %
Lymphs Abs: 1495 cells/uL (ref 850–3900)
MCH: 30.6 pg (ref 27.0–33.0)
MCHC: 33.5 g/dL (ref 32.0–36.0)
MCV: 91.4 fL (ref 80.0–100.0)
MONO ABS: 585 {cells}/uL (ref 200–950)
MPV: 9.3 fL (ref 7.5–12.5)
Monocytes Relative: 9 %
Neutro Abs: 4225 cells/uL (ref 1500–7800)
Neutrophils Relative %: 65 %
Platelets: 250 10*3/uL (ref 140–400)
RBC: 4.9 MIL/uL (ref 3.80–5.10)
RDW: 13.8 % (ref 11.0–15.0)
WBC: 6.5 10*3/uL (ref 3.8–10.8)

## 2017-06-17 LAB — LIPID PANEL
Cholesterol: 302 mg/dL — ABNORMAL HIGH (ref <200)
HDL: 61 mg/dL (ref >50)
LDL CALC: 208 mg/dL — AB (ref <100)
Total CHOL/HDL Ratio: 5 Ratio — ABNORMAL HIGH (ref <5.0)
Triglycerides: 163 mg/dL — ABNORMAL HIGH (ref <150)
VLDL: 33 mg/dL — AB (ref <30)

## 2017-06-17 LAB — HEMOGLOBIN A1C
Hgb A1c MFr Bld: 5.6 % (ref <5.7)
Mean Plasma Glucose: 114 mg/dL

## 2017-06-18 ENCOUNTER — Other Ambulatory Visit: Payer: Self-pay | Admitting: Family Medicine

## 2017-06-18 MED ORDER — ATORVASTATIN CALCIUM 40 MG PO TABS: 40.0000 mg | ORAL_TABLET | Freq: Every day | ORAL | 3 refills | Status: DC

## 2017-06-20 ENCOUNTER — Other Ambulatory Visit: Payer: Self-pay | Admitting: Physician Assistant

## 2017-06-20 ENCOUNTER — Other Ambulatory Visit: Payer: Self-pay | Admitting: Family Medicine

## 2017-06-20 NOTE — Telephone Encounter (Signed)
Ok to refill 

## 2017-06-21 NOTE — Telephone Encounter (Signed)
Approved #[id number] 

## 2017-06-23 NOTE — Telephone Encounter (Signed)
Rx filled

## 2017-07-01 ENCOUNTER — Ambulatory Visit (INDEPENDENT_AMBULATORY_CARE_PROVIDER_SITE_OTHER): Admitting: Adult Health

## 2017-07-01 ENCOUNTER — Encounter: Payer: Self-pay | Admitting: Adult Health

## 2017-07-01 VITALS — BP 140/80 | HR 60 | Ht 66.0 in | Wt 192.5 lb

## 2017-07-01 DIAGNOSIS — R350 Frequency of micturition: Secondary | ICD-10-CM

## 2017-07-01 DIAGNOSIS — N898 Other specified noninflammatory disorders of vagina: Secondary | ICD-10-CM

## 2017-07-01 DIAGNOSIS — M545 Low back pain, unspecified: Secondary | ICD-10-CM

## 2017-07-01 DIAGNOSIS — N390 Urinary tract infection, site not specified: Secondary | ICD-10-CM | POA: Diagnosis not present

## 2017-07-01 LAB — POCT URINALYSIS DIPSTICK
Glucose, UA: NEGATIVE
Leukocytes, UA: NEGATIVE
NITRITE UA: POSITIVE
RBC UA: NEGATIVE

## 2017-07-01 MED ORDER — SULFAMETHOXAZOLE-TRIMETHOPRIM 800-160 MG PO TABS: 1.0000 | ORAL_TABLET | Freq: Two times a day (BID) | ORAL | 0 refills | Status: DC

## 2017-07-01 MED ORDER — ESTRADIOL 0.1 MG/GM VA CREA: TOPICAL_CREAM | VAGINAL | 0 refills | Status: DC

## 2017-07-01 MED ORDER — CYCLOBENZAPRINE HCL 5 MG PO TABS: 5.0000 mg | ORAL_TABLET | Freq: Three times a day (TID) | ORAL | 0 refills | Status: DC | PRN

## 2017-07-01 NOTE — Progress Notes (Signed)
Subjective:     Patient ID: Peggy Fitzgerald, female   DOB: 1/Park Liter21/1957, 61 y.o.   MRN: [id number]  HPI Peggy Fitzgerald is a 61 year old white female in complaining of urinary frequency, vaginal dryness and low back pain.  Review of Systems Urinary frequency Vaginal dryness Low back pain Reviewed past medical,surgical, social and family history. Reviewed medications and allergies.     Objective:   Physical Exam BP 140/80 (BP Location: Left Arm, Patient Position: Sitting, Cuff Size: Normal)   Pulse 60   Ht 5\' 6"  (1.676 m)   Wt 192 lb 8 oz (87.3 kg)   BMI 31.07 kg/m    urine dipstick 1+ protein and +nitrates, no CVAT but tender low back. Pelvic deferred, had one 04/03/17. Peggy Fitzgerald normal.  Will try vaginal estrogen now and will treat UTI and will sent urine for UA C&S. Will try ice and flexeril for low back.  Assessment:     1. Urinary frequency   2. Vaginal dryness   3. Urinary tract infection without hematuria, site unspecified   4. Low back pain without sciatica, unspecified back pain laterality, unspecified chronicity       Plan:    UA C&S sent Rx Septra ds 1 bid x 7 days #14 Rx flexeril 5 mg #30 take 1 tid prn Rx estrace vaginal cream use 0.5 gm daily x 2 weeks then 2-3 x weekly in vagina, use good lubricate with sex Use ice on lower back prn  Follow up in 4 weeks

## 2017-07-02 LAB — MICROSCOPIC EXAMINATION: Casts: NONE SEEN /lpf

## 2017-07-02 LAB — URINALYSIS, ROUTINE W REFLEX MICROSCOPIC
BILIRUBIN UA: NEGATIVE
Glucose, UA: NEGATIVE
Ketones, UA: NEGATIVE
NITRITE UA: POSITIVE — AB
PROTEIN UA: NEGATIVE
RBC, UA: NEGATIVE
SPEC GRAV UA: 1.022 (ref 1.005–1.030)
UUROB: 0.2 mg/dL (ref 0.2–1.0)
pH, UA: 5 (ref 5.0–7.5)

## 2017-07-03 LAB — URINE CULTURE

## 2017-07-04 ENCOUNTER — Telehealth: Payer: Self-pay | Admitting: Adult Health

## 2017-07-04 NOTE — Telephone Encounter (Signed)
Pt aware that urine culture +, finish septra ds...she is feeling better

## 2017-07-11 ENCOUNTER — Other Ambulatory Visit: Payer: Self-pay | Admitting: Physician Assistant

## 2017-07-14 ENCOUNTER — Telehealth: Payer: Self-pay | Admitting: *Deleted

## 2017-07-14 MED ORDER — ESTRADIOL 0.1 MG/GM VA CREA: TOPICAL_CREAM | VAGINAL | 0 refills | Status: DC

## 2017-07-14 NOTE — Telephone Encounter (Signed)
Estrace vaginal cream [facility]

## 2017-07-14 NOTE — Telephone Encounter (Signed)
Requesting 90 supply for estrace vaginal cream.

## 2017-07-14 NOTE — Telephone Encounter (Signed)
Refill appropriate 

## 2017-07-17 ENCOUNTER — Encounter: Payer: Self-pay | Admitting: Physician Assistant

## 2017-07-23 ENCOUNTER — Other Ambulatory Visit: Payer: Self-pay | Admitting: Adult Health

## 2017-07-31 ENCOUNTER — Ambulatory Visit: Admitting: Adult Health

## 2017-08-06 ENCOUNTER — Encounter: Payer: Self-pay | Admitting: Physician Assistant

## 2017-08-06 ENCOUNTER — Ambulatory Visit (INDEPENDENT_AMBULATORY_CARE_PROVIDER_SITE_OTHER): Admitting: Physician Assistant

## 2017-08-06 VITALS — BP 138/88 | HR 63 | Temp 98.1°F | Resp 16 | Ht 66.0 in | Wt 194.6 lb

## 2017-08-06 DIAGNOSIS — B372 Candidiasis of skin and nail: Secondary | ICD-10-CM | POA: Diagnosis not present

## 2017-08-06 MED ORDER — NYSTATIN 100000 UNIT/GM EX CREA: 1.0000 "application " | TOPICAL_CREAM | Freq: Two times a day (BID) | CUTANEOUS | 0 refills | Status: DC

## 2017-08-06 MED ORDER — FLUCONAZOLE 150 MG PO TABS: 150.0000 mg | ORAL_TABLET | Freq: Once | ORAL | 0 refills | Status: AC

## 2017-08-06 NOTE — Progress Notes (Signed)
Patient ID: ADAORA MCHANEY MRN: [id number], DOB: 13-May-1956, 61 y.o. Date of Encounter: 08/06/2017, 3:53 PM    Chief Complaint:  Chief Complaint  Patient presents with  . rash lower stomach     HPI: 61 y.o. year old female presents with above.   Says that she has never had anything like this. Says that yesterday she just noticed that all of a sudden. Then did apply some Neosporin to the area yesterday. Has used no other treatments at home.     Home Meds:   Outpatient Medications Prior to Visit  Medication Sig Dispense Refill  . albuterol (PROVENTIL HFA;VENTOLIN HFA) 108 (90 Base) MCG/ACT inhaler Inhale 2 puffs into the lungs every 6 (six) hours as needed for wheezing or shortness of breath.    . APPLE CIDER VINEGAR PO Take by mouth 3 (three) times daily.    Marland Kitchen atorvastatin (LIPITOR) 40 MG tablet Take 1 tablet (40 mg total) by mouth daily. 90 tablet 3  . Butalbital-APAP-Caffeine 50-300-40 MG CAPS TAKE 1 TO 2 CAPSULES EVERY 4 HOURS AS NEEDED (NO MORE THAN 6 CAPSULES PER DAY) 90 capsule 1  . celecoxib (CELEBREX) 200 MG capsule TAKE 1 CAPSULE AS NEEDED 90 capsule 1  . Cholecalciferol (VITAMIN D3) 5000 units CAPS Take 5,000 Units by mouth daily.     . clobetasol (TEMOVATE) 0.05 % external solution APPLY 1 APPLICATION TOPICALLY TWICE A DAY (Patient taking differently: APPLY 1 APPLICATION TOPICALLY TWICE A DAY/as needed) 150 mL 3  . cyclobenzaprine (FLEXERIL) 5 MG tablet Take 1 tablet (5 mg total) by mouth 3 (three) times daily as needed for muscle spasms. 30 tablet 0  . diphenoxylate-atropine (LOMOTIL) 2.5-0.025 MG tablet Take 1 tablet by mouth as needed for diarrhea or loose stools. 30 tablet 5  . estradiol (ESTRACE VAGINAL) 0.1 MG/GM vaginal cream Use 0.5 gm in vagina daily for 2 weeks then 2-3 x weekly 42.5 g 0  . Flaxseed, Linseed, (FLAXSEED OIL MAX STR) 1300 MG CAPS Take 1,300 mg by mouth 2 (two) times daily.     . hydrOXYzine (ATARAX/VISTARIL) 25 MG tablet Take 1 tablet (25  mg total) by mouth 3 (three) times daily as needed. (Patient taking differently: Take 25 mg by mouth 4 (four) times daily as needed for itching. ) 30 tablet 1  . lamoTRIgine (LAMICTAL) 25 MG tablet Take 1 tablet (25 mg total) by mouth daily. (Patient taking differently: Take 25 mg by mouth 2 (two) times daily. ) 14 tablet 0  . LATUDA 80 MG TABS tablet Take 80 mg by mouth daily.     . Magnesium 400 MG TABS Take 400 mg by mouth daily.     . meclizine (ANTIVERT) 25 MG tablet TAKE 1 TABLET AS NEEDED FOR DIZZINESS 90 tablet 0  . methocarbamol (ROBAXIN) 500 MG tablet Take 500 mg by mouth 4 (four) times daily.    . mometasone (NASONEX) 50 MCG/ACT nasal spray Place 2 sprays into the nose daily as needed (for allergies).     . phentermine 37.5 MG capsule Take 1 capsule (37.5 mg total) by mouth every morning. 30 capsule 2  . promethazine (PHENERGAN) 25 MG tablet TAKE 1 TABLET EVERY 6 HOURS AS NEEDED FOR NAUSEA OR VOMITING 90 tablet 0  . TOPROL XL 100 MG 24 hr tablet TAKE 1 TABLET AT BEDTIME 90 tablet 1  . traMADol (ULTRAM) 50 MG tablet Take 50 mg by mouth every 6 (six) hours as needed for moderate pain.     Marland Kitchen  valACYclovir (VALTREX) 500 MG tablet TAKE 1 TABLET DAILY (Patient taking differently: TAKE 1 TABLET DAILY PRN) 90 tablet 0  . zolpidem (AMBIEN) 10 MG tablet Take 10 mg by mouth at bedtime as needed for sleep.    Marland Kitchen. ZOVIRAX 5 % APPLY 1 APPLICATION TOPICALLY AS NEEDED (Patient taking differently: APPLY 1 APPLICATION TOPICALLY twice daily AS NEEDED for outbreaks) 15 g 3  . sulfamethoxazole-trimethoprim (BACTRIM DS,SEPTRA DS) 800-160 MG tablet TAKE 1 TABLET BY MOUTH TWICE DAILY 14 tablet 0   No facility-administered medications prior to visit.     Allergies:  Allergies  Allergen Reactions  . Milk-Related Compounds Diarrhea    Cannot drink plain milk, but can eat ice cream, and milk in cereal  . Norco [Hydrocodone-Acetaminophen] Other (See Comments)    Headaches  . Oxycodone-Acetaminophen Itching     Nightmares      Review of Systems: See HPI for pertinent ROS. All other ROS negative.    Physical Exam: Blood pressure 138/88, pulse 63, temperature 98.1 F (36.7 C), temperature source Oral, resp. rate 16, height 5\' 6"  (1.676 m), weight 194 lb 9.6 oz (88.3 kg), SpO2 98 %., Body mass index is 31.41 kg/m. General:  WNWD WF. Appears in no acute distress. Neck: Supple. No thyromegaly. No lymphadenopathy. Lungs: Clear bilaterally to auscultation without wheezes, rales, or rhonchi. Breathing is unlabored. Heart: Regular rhythm. No murmurs, rubs, or gallops. Msk:  Strength and tone normal for age. Skin: Panniculus, Intertrigo--She has large abdomen with skin fold on underside---entire right side of this is with diffuse erythema--shiny appearance. Neuro: Alert and oriented X 3. Moves all extremities spontaneously. Gait is normal. CNII-XII grossly in tact. Psych:  Responds to questions appropriately with a normal affect.    ASSESSMENT AND PLAN:  61 y.o. year old female with  1. Candidiasis of skin --Diflucan 150mg  x 1 --Nystatin Cream Discussed that this is caused by moisture Discussed that this happens in skin folds were moisture is trapped in the skin folds. Discussed that after her shower to make sure to dry within this skin fold very carefully. Even need to have a fan blow on the area to can completely dry. Also discussed that anytime that she is outside and gets sweaty to then change clothing and dry within the skin folds well. Also discussed to make sure that the skin is very dry prior to applying this nystatin cream so that it is not locking in moisture. She voices understanding and agrees with all the above treatment.    [address], Bren Steers Beth KingsleyDixon, [place], [facility] 08/06/2017 3:53 PM

## 2017-10-01 ENCOUNTER — Other Ambulatory Visit: Payer: Self-pay | Admitting: Family Medicine

## 2017-10-01 ENCOUNTER — Other Ambulatory Visit: Payer: Self-pay | Admitting: Physician Assistant

## 2017-12-04 ENCOUNTER — Telehealth: Payer: Self-pay | Admitting: Physician Assistant

## 2017-12-04 MED ORDER — NYSTATIN 100000 UNIT/GM EX POWD: Freq: Four times a day (QID) | CUTANEOUS | 0 refills | Status: DC

## 2017-12-04 MED ORDER — FLUCONAZOLE 150 MG PO TABS: 150.0000 mg | ORAL_TABLET | Freq: Once | ORAL | 0 refills | Status: AC

## 2017-12-04 NOTE — Telephone Encounter (Signed)
Approved.  Diflucan 150 mg x1 dose.  Also send in Rx for nystatin powder.

## 2017-12-04 NOTE — Telephone Encounter (Signed)
pls see note below ok to send?

## 2017-12-04 NOTE — Telephone Encounter (Signed)
rx sent to pharmacy.patient aware. 

## 2017-12-04 NOTE — Telephone Encounter (Signed)
Pt states that the yeast infection under her "stomach roll" last time she called in diflucan, can we do this again please [facility].

## 2017-12-08 ENCOUNTER — Other Ambulatory Visit: Payer: Self-pay | Admitting: Physician Assistant

## 2017-12-08 DIAGNOSIS — R42 Dizziness and giddiness: Secondary | ICD-10-CM

## 2017-12-22 ENCOUNTER — Other Ambulatory Visit: Payer: Self-pay | Admitting: Physician Assistant

## 2017-12-23 ENCOUNTER — Other Ambulatory Visit: Payer: Self-pay | Admitting: Family Medicine

## 2017-12-24 NOTE — Telephone Encounter (Signed)
Ok to refill??  Last office visit/ refill 06/16/2017, #2 refills.

## 2017-12-24 NOTE — Telephone Encounter (Signed)
Denied.

## 2017-12-31 ENCOUNTER — Other Ambulatory Visit: Payer: Self-pay | Admitting: Physician Assistant

## 2017-12-31 NOTE — Telephone Encounter (Signed)
Approved. # 90 + 2. 

## 2017-12-31 NOTE — Telephone Encounter (Signed)
Last OV 06/16/2017 Last refill 10/01/2017 Ok to refill?

## 2018-01-01 NOTE — Telephone Encounter (Deleted)
Refill appropriate 

## 2018-02-09 ENCOUNTER — Other Ambulatory Visit: Payer: Self-pay | Admitting: Physician Assistant

## 2018-03-16 ENCOUNTER — Ambulatory Visit (INDEPENDENT_AMBULATORY_CARE_PROVIDER_SITE_OTHER): Admitting: Physician Assistant

## 2018-03-16 ENCOUNTER — Encounter: Payer: Self-pay | Admitting: Physician Assistant

## 2018-03-16 VITALS — BP 118/84 | HR 77 | Temp 97.7°F | Resp 18 | Ht 66.0 in | Wt 197.4 lb

## 2018-03-16 DIAGNOSIS — B9689 Other specified bacterial agents as the cause of diseases classified elsewhere: Secondary | ICD-10-CM

## 2018-03-16 DIAGNOSIS — J988 Other specified respiratory disorders: Secondary | ICD-10-CM

## 2018-03-16 MED ORDER — HYDROCODONE-HOMATROPINE 5-1.5 MG/5ML PO SYRP: 5.0000 mL | ORAL_SOLUTION | Freq: Four times a day (QID) | ORAL | 0 refills | Status: DC | PRN

## 2018-03-16 MED ORDER — AZITHROMYCIN 250 MG PO TABS: ORAL_TABLET | ORAL | 0 refills | Status: DC

## 2018-03-16 NOTE — Progress Notes (Signed)
Patient ID: Peggy Fitzgerald MRN: [id number], DOB: 1956/03/23, 62 y.o. Date of Encounter: 03/16/2018, 12:41 PM    Chief Complaint:  Chief Complaint  Patient presents with  . Cough    x1 week   . Nasal Congestion  . Nausea  . Headache     HPI: 62 y.o. year old female presents with above.   Through the visit she does have cough that sounds very congested -- repetitive through the visit.  She states that she has been having this cough as well as some congestion in her head and nose for over a week now.  States that finally over the last several nights she has ended up just staying in the recliner at night because otherwise getting very little sleep secondary to repetitive cough.  Has been using NyQuil and using DayQuil during the day with minimal relief with that.  Has coughed up some phlegm at times but still a lot of chest congestion.  Has had no known fevers or chills.  No significant sore throat.     Home Meds:   Outpatient Medications Prior to Visit  Medication Sig Dispense Refill  . albuterol (PROVENTIL HFA;VENTOLIN HFA) 108 (90 Base) MCG/ACT inhaler Inhale 2 puffs into the lungs every 6 (six) hours as needed for wheezing or shortness of breath.    . APPLE CIDER VINEGAR PO Take by mouth 3 (three) times daily.    Marland Kitchen atorvastatin (LIPITOR) 40 MG tablet Take 1 tablet (40 mg total) by mouth daily. 90 tablet 3  . Butalbital-APAP-Caffeine 50-300-40 MG CAPS TAKE 1 TO 2 CAPSULES EVERY 4 HOURS AS NEEDED, NOT MORE THAN 6 CAPSULES DAILY 90 capsule 1  . celecoxib (CELEBREX) 200 MG capsule TAKE 1 CAPSULE AS NEEDED 90 capsule 1  . Cholecalciferol (VITAMIN D3) 5000 units CAPS Take 5,000 Units by mouth daily.     . clobetasol (TEMOVATE) 0.05 % external solution APPLY 1 APPLICATION TOPICALLY TWICE A DAY (Patient taking differently: APPLY 1 APPLICATION TOPICALLY TWICE A DAY/as needed) 150 mL 3  . Clobetasol Propionate 0.05 % lotion APPLY 1 APPLICATION TOPICALLY THREE TIMES A DAY AS NEEDED 59 mL  1  . cyclobenzaprine (FLEXERIL) 5 MG tablet Take 1 tablet (5 mg total) by mouth 3 (three) times daily as needed for muscle spasms. 30 tablet 0  . diphenoxylate-atropine (LOMOTIL) 2.5-0.025 MG tablet Take 1 tablet by mouth as needed for diarrhea or loose stools. 30 tablet 5  . estradiol (ESTRACE VAGINAL) 0.1 MG/GM vaginal cream Use 0.5 gm in vagina daily for 2 weeks then 2-3 x weekly 42.5 g 0  . Flaxseed, Linseed, (FLAXSEED OIL MAX STR) 1300 MG CAPS Take 1,300 mg by mouth 2 (two) times daily.     . hydrOXYzine (ATARAX/VISTARIL) 25 MG tablet Take 1 tablet (25 mg total) by mouth 3 (three) times daily as needed. (Patient taking differently: Take 25 mg by mouth 4 (four) times daily as needed for itching. ) 30 tablet 1  . lamoTRIgine (LAMICTAL) 25 MG tablet Take 1 tablet (25 mg total) by mouth daily. (Patient taking differently: Take 25 mg by mouth 2 (two) times daily. Patient taking 100mg  in morning and 50mg  at night) 14 tablet 0  . LATUDA 80 MG TABS tablet Take 80 mg by mouth daily.     . Magnesium 400 MG TABS Take 400 mg by mouth daily.     . meclizine (ANTIVERT) 25 MG tablet TAKE 1 TABLET AS NEEDED FOR DIZZINESS 90 tablet 0  . methocarbamol (ROBAXIN)  500 MG tablet Take 500 mg by mouth 4 (four) times daily.    . metoprolol succinate (TOPROL-XL) 100 MG 24 hr tablet TAKE 1 TABLET AT BEDTIME 90 tablet 1  . mometasone (NASONEX) 50 MCG/ACT nasal spray Place 2 sprays into the nose daily as needed (for allergies).     . nystatin (MYCOSTATIN/NYSTOP) powder Apply topically 4 (four) times daily. 15 g 0  . nystatin cream (MYCOSTATIN) Apply 1 application topically 2 (two) times daily. 30 g 0  . phentermine 37.5 MG capsule Take 1 capsule (37.5 mg total) by mouth every morning. 30 capsule 2  . promethazine (PHENERGAN) 25 MG tablet TAKE 1 TABLET EVERY 6 HOURS AS NEEDED FOR NAUSEA OR VOMITING 90 tablet 0  . traMADol (ULTRAM) 50 MG tablet Take 50 mg by mouth every 6 (six) hours as needed for moderate pain.     .  valACYclovir (VALTREX) 500 MG tablet TAKE 1 TABLET DAILY 90 tablet 0  . zolpidem (AMBIEN) 10 MG tablet Take 10 mg by mouth at bedtime as needed for sleep.    Marland Kitchen. ZOVIRAX 5 % APPLY 1 APPLICATION TOPICALLY AS NEEDED (Patient taking differently: APPLY 1 APPLICATION TOPICALLY twice daily AS NEEDED for outbreaks) 15 g 3   No facility-administered medications prior to visit.     Allergies:  Allergies  Allergen Reactions  . Milk-Related Compounds Diarrhea    Cannot drink plain milk, but can eat ice cream, and milk in cereal  . Norco [Hydrocodone-Acetaminophen] Other (See Comments)    Headaches  . Oxycodone-Acetaminophen Itching    Nightmares      Review of Systems: See HPI for pertinent ROS. All other ROS negative.    Physical Exam: Blood pressure 118/84, pulse 77, temperature 97.7 F (36.5 C), temperature source Oral, resp. rate 18, height 5\' 6"  (1.676 m), weight 89.5 kg (197 lb 6.4 oz), SpO2 97 %., Body mass index is 31.86 kg/m. General:  WNWD WF. Appears in no acute distress. HEENT: Normocephalic, atraumatic, eyes without discharge, sclera non-icteric, nares are without discharge. Bilateral auditory canals clear, TM's are without perforation, pearly grey and translucent with reflective cone of light bilaterally. Oral cavity moist, posterior pharynx without exudate, erythema, peritonsillar abscess.  Neck: Supple. No thyromegaly. No lymphadenopathy. Lungs: Clear bilaterally to auscultation without wheezes, rales, or rhonchi. Breathing is unlabored. Heart: Regular rhythm. No murmurs, rubs, or gallops. Msk:  Strength and tone normal for age. Extremities/Skin: Warm and dry. Neuro: Alert and oriented X 3. Moves all extremities spontaneously. Gait is normal. CNII-XII grossly in tact. Psych:  Responds to questions appropriately with a normal affect.     ASSESSMENT AND PLAN:  62 y.o. year old female with  1. Bacterial respiratory infection [name] to take the azithromycin as directed.  Can  use Hycodan at night as a cough suppressant for some symptom relief.  Recommend Mucinex DM during the day as an expectorant.  Follow-up if symptoms worsen significantly or if symptoms do not resolve within 1 week after completion of antibiotic. - azithromycin (ZITHROMAX) 250 MG tablet; Day 1: Take 2 daily. Days 2 -5: Take 1 daily.  Dispense: 6 tablet; Refill: 0 - HYDROcodone-homatropine (HYCODAN) 5-1.5 MG/5ML syrup; Take 5 mLs by mouth every 6 (six) hours as needed.  Dispense: 120 mL; Refill: 0   Signed, [address]Mary Beth PerryDixon, [place], [facility] 03/16/2018 12:41 PM

## 2018-03-18 ENCOUNTER — Other Ambulatory Visit: Payer: Self-pay | Admitting: Physician Assistant

## 2018-03-20 ENCOUNTER — Encounter: Payer: Self-pay | Admitting: Adult Health

## 2018-03-20 ENCOUNTER — Ambulatory Visit (INDEPENDENT_AMBULATORY_CARE_PROVIDER_SITE_OTHER): Admitting: Adult Health

## 2018-03-20 VITALS — BP 158/92 | HR 57 | Ht 66.0 in | Wt 198.0 lb

## 2018-03-20 DIAGNOSIS — M545 Low back pain, unspecified: Secondary | ICD-10-CM

## 2018-03-20 DIAGNOSIS — R110 Nausea: Secondary | ICD-10-CM | POA: Diagnosis not present

## 2018-03-20 DIAGNOSIS — R109 Unspecified abdominal pain: Secondary | ICD-10-CM

## 2018-03-20 DIAGNOSIS — N898 Other specified noninflammatory disorders of vagina: Secondary | ICD-10-CM | POA: Diagnosis not present

## 2018-03-20 LAB — POCT URINALYSIS DIPSTICK
Blood, UA: NEGATIVE
Glucose, UA: NEGATIVE
Ketones, UA: NEGATIVE
Leukocytes, UA: NEGATIVE
Nitrite, UA: NEGATIVE
Protein, UA: NEGATIVE

## 2018-03-20 MED ORDER — PROMETHAZINE HCL 25 MG PO TABS
25.0000 mg | ORAL_TABLET | Freq: Four times a day (QID) | ORAL | 1 refills | Status: DC | PRN
Start: 2018-03-20 — End: 2018-05-21

## 2018-03-20 NOTE — Progress Notes (Signed)
Subjective:     Patient ID: Peggy Fitzgerald, female   DOB: 1956-04-18, 62 y.o.   MRN: [id number]  HPI Peggy Fitzgerald is a 62 year old white female, married in complaining of vaginal discharge with odor,stomach cramps, some nausea today and low back pain.Feels better than did about 2 weeks ago. PCP is Peggy Fitzgerald ,[place].   Review of Systems +vaginal discharge with odor Stomach cramps +nasuea today Low back pain Reviewed past medical,surgical, social and family history. Reviewed medications and allergies.     Objective:   Physical Exam BP (!) 158/92 (BP Location: Right Arm, Patient Position: Sitting, Cuff Size: Normal)   Pulse (!) 57   Ht 5\' 6"  (1.676 m)   Wt 198 lb (89.8 kg)   BMI 31.96 kg/m  urine dipstick negative.    Skin warm and dry.Pelvic: external genitalia is normal in appearance no lesions, vagina: white discharge with odor,urethra has no lesions or masses noted, cervix:smooth and bulbous, uterus: normal size, shape and contour, non tender, no masses felt, adnexa: no masses or tenderness noted. Bladder is non tender and no masses felt.Nuswab obtained. No CVAT, mild tenderness low back near sacral area. Will get UA C&S and nuswab and talk Monday.   Assessment:     1. Vaginal discharge   2. Stomach cramps   3. Vaginal odor   4. Low back pain without sciatica, unspecified back pain laterality, unspecified chronicity       Plan:     UA C& S sent Nuswab sent Push fluids Meds ordered this encounter  Medications  . promethazine (PHENERGAN) 25 MG tablet    Sig: Take 1 tablet (25 mg total) by mouth every 6 (six) hours as needed for nausea or vomiting.    Dispense:  30 tablet    Refill:  1    Order Specific Question:   Supervising Provider    Answer:   Lazaro ArmsEURE, LUTHER H [[id number]]  F/U prn Will talk Monday

## 2018-03-21 LAB — URINALYSIS, ROUTINE W REFLEX MICROSCOPIC
Bilirubin, UA: NEGATIVE
Glucose, UA: NEGATIVE
Ketones, UA: NEGATIVE
LEUKOCYTES UA: NEGATIVE
Nitrite, UA: NEGATIVE
PH UA: 5.5 (ref 5.0–7.5)
PROTEIN UA: NEGATIVE
RBC, UA: NEGATIVE
Specific Gravity, UA: 1.02 (ref 1.005–1.030)
Urobilinogen, Ur: 0.2 mg/dL (ref 0.2–1.0)

## 2018-03-22 LAB — URINE CULTURE

## 2018-03-24 ENCOUNTER — Ambulatory Visit (INDEPENDENT_AMBULATORY_CARE_PROVIDER_SITE_OTHER): Admitting: Family Medicine

## 2018-03-24 ENCOUNTER — Encounter: Payer: Self-pay | Admitting: Family Medicine

## 2018-03-24 ENCOUNTER — Telehealth: Payer: Self-pay | Admitting: *Deleted

## 2018-03-24 VITALS — BP 142/98 | HR 78 | Temp 98.0°F | Ht 66.0 in | Wt 198.8 lb

## 2018-03-24 DIAGNOSIS — J45901 Unspecified asthma with (acute) exacerbation: Secondary | ICD-10-CM | POA: Diagnosis not present

## 2018-03-24 DIAGNOSIS — J189 Pneumonia, unspecified organism: Secondary | ICD-10-CM

## 2018-03-24 DIAGNOSIS — J181 Lobar pneumonia, unspecified organism: Secondary | ICD-10-CM

## 2018-03-24 LAB — NUSWAB VAGINITIS PLUS (VG+)
CANDIDA GLABRATA, NAA: NEGATIVE
CHLAMYDIA TRACHOMATIS, NAA: NEGATIVE
Candida albicans, NAA: NEGATIVE
Neisseria gonorrhoeae, NAA: NEGATIVE
TRICH VAG BY NAA: NEGATIVE

## 2018-03-24 MED ORDER — PREDNISONE 20 MG PO TABS: 40.0000 mg | ORAL_TABLET | Freq: Every day | ORAL | 0 refills | Status: AC

## 2018-03-24 MED ORDER — LEVOFLOXACIN 750 MG PO TABS: 750.0000 mg | ORAL_TABLET | Freq: Every day | ORAL | 0 refills | Status: AC

## 2018-03-24 MED ORDER — IPRATROPIUM-ALBUTEROL 0.5-2.5 (3) MG/3ML IN SOLN
3.0000 mL | Freq: Once | RESPIRATORY_TRACT | Status: AC
  Administered 2018-03-24: 3 mL via RESPIRATORY_TRACT

## 2018-03-24 MED ORDER — HYDROCODONE-HOMATROPINE 5-1.5 MG/5ML PO SYRP: 5.0000 mL | ORAL_SOLUTION | Freq: Three times a day (TID) | ORAL | 0 refills | Status: DC | PRN

## 2018-03-24 MED ORDER — ALBUTEROL SULFATE HFA 108 (90 BASE) MCG/ACT IN AERS: 2.0000 | INHALATION_SPRAY | Freq: Four times a day (QID) | RESPIRATORY_TRACT | 0 refills | Status: DC | PRN

## 2018-03-24 NOTE — Telephone Encounter (Signed)
LMOVM for patient to return call.

## 2018-03-24 NOTE — Progress Notes (Signed)
Patient ID: Peggy Fitzgerald, female    DOB: [birth year], 62 y.o.   MRN: [id number]  PCP: Dorena Bodo, PA-C  Chief Complaint  Patient presents with  . Cough    causing head, chest and back to hurt   . Chills    symptoms started 4 days ago     Subjective:   Peggy Fitzgerald is a 62 y.o. female, presents to clinic with CC of asthma, hyperlipidemia, hypertension, vertigo, seasonal allergies, bipolar 1 disorder, low back pain without sciatica, she presents with 2 weeks of worsening dry cough, for the past 2-3 days has been more frequent, more forceful with chills and decreased energy.  She was seen 1 week ago here in clinic by her PCP and dx with bacterial respiratory infection and treated with Z-Pak and Mucinex.  She did not improve very much but acutely worsened to 3 days ago.  Cough is causing some shortness of breath and central chest tightness, but she does not feel severely wheezy like her normal asthma exacerbations.  She typically uses her inhaler every few months.  Her continued illness is triggering migraines and she also complains of worsening chronic back pain.  She denies chest pain, rib pain, fever, sweats, rash, weight loss, palpitations, near syncope, lower extremity edema.  Her husband is ill with same symptoms and was diagnosed with pneumonia last week.  [name] sleeping more than normal during the day and having difficulty sleeping at night secondary to cough.  She has no other associated symptoms, no other aggravating factors.  Her cough medicine that was prescribed and Mucinex helps a little bit, she did start taking TheraFlu, DayQuil and NyQuil without any improvement.   Patient Active Problem List   Diagnosis Date Noted  . Low back pain without sciatica 07/01/2017  . Urinary tract infection without hematuria 07/01/2017  . Vaginal dryness 07/01/2017  . Urinary frequency 07/01/2017  . Bipolar 1 disorder, mixed, moderate (HCC) 08/28/2016  . Insomnia 12/18/2015  .  Headache 12/18/2015  . Vertigo 12/18/2015  . Allergy   . Anxiety   . Asthma   . Depression   . Prediabetes   . GERD (gastroesophageal reflux disease)   . Hyperlipidemia   . Hypertension   . Herpes simplex type 2 infection   . SHOULDER PAIN 04/24/2009  . IMPINGEMENT SYNDROME 04/24/2009     Prior to Admission medications   Medication Sig Start Date End Date Taking? Authorizing Provider  albuterol (PROVENTIL HFA;VENTOLIN HFA) 108 (90 Base) MCG/ACT inhaler Inhale 2 puffs into the lungs every 6 (six) hours as needed for wheezing or shortness of breath.   Yes [provider]  APPLE CIDER VINEGAR PO Take by mouth as needed.    Yes [provider]  atorvastatin (LIPITOR) 40 MG tablet Take 1 tablet (40 mg total) by mouth daily. 06/18/17  Yes Donita Brooks, MD  Butalbital-APAP-Caffeine 50-300-40 MG CAPS TAKE 1 TO 2 CAPSULES EVERY 4 HOURS AS NEEDED, NOT MORE THAN 6 CAPSULES DAILY 01/01/18  Yes Allayne Butcher B, PA-C  celecoxib (CELEBREX) 200 MG capsule TAKE 1 CAPSULE AS NEEDED 12/12/16  Yes Donita Brooks, MD  Cholecalciferol (VITAMIN D3) 5000 units CAPS Take 5,000 Units by mouth daily.    Yes [provider]  clobetasol (TEMOVATE) 0.05 % external solution APPLY 1 APPLICATION TOPICALLY TWICE A DAY Patient taking differently: APPLY 1 APPLICATION TOPICALLY TWICE A DAY/as needed 07/14/17  Yes Dixon, Mary B, PA-C  Clobetasol Propionate 0.05 % lotion  APPLY 1 APPLICATION TOPICALLY THREE TIMES A DAY AS NEEDED 12/08/17  Yes Dorena Bodo, PA-C  diphenoxylate-atropine (LOMOTIL) 2.5-0.025 MG tablet Take 1 tablet by mouth as needed for diarrhea or loose stools. 08/23/16  Yes Dixon, Mary B, PA-C  Flaxseed, Linseed, (FLAXSEED OIL MAX STR) 1300 MG CAPS Take 1,300 mg by mouth 2 (two) times daily.    Yes [provider]  hydrOXYzine (ATARAX/VISTARIL) 25 MG tablet Take 1 tablet (25 mg total) by mouth 3 (three) times daily as needed. Patient taking differently: Take 25 mg by mouth 3  (three) times daily.  06/19/16  Yes Dorena Bodo, PA-C  lamoTRIgine (LAMICTAL) 25 MG tablet Take 1 tablet (25 mg total) by mouth daily. Patient taking differently: Take by mouth. Patient taking 100mg  in morning and 50mg  at night 08/30/16  Yes Withrow, John C, FNP  LATUDA 80 MG TABS tablet Take 80 mg by mouth daily.  02/11/17  Yes [provider]  meclizine (ANTIVERT) 25 MG tablet TAKE 1 TABLET AS NEEDED FOR DIZZINESS 12/08/17  Yes Allayne Butcher B, PA-C  methocarbamol (ROBAXIN) 500 MG tablet Take 500 mg by mouth 4 (four) times daily.   Yes [provider]  metoprolol succinate (TOPROL-XL) 100 MG 24 hr tablet TAKE 1 TABLET AT BEDTIME 02/09/18  Yes Dixon, Mary B, PA-C  mometasone (NASONEX) 50 MCG/ACT nasal spray Place 2 sprays into the nose daily as needed (for allergies).    Yes [provider]  NYSTATIN powder APPLY  POWDER TOPICALLY 4 TIMES DAILY 03/18/18  Yes Dorena Bodo, PA-C  promethazine (PHENERGAN) 25 MG tablet Take 1 tablet (25 mg total) by mouth every 6 (six) hours as needed for nausea or vomiting. 03/20/18  Yes Adline Potter, NP  traMADol (ULTRAM) 50 MG tablet Take 50 mg by mouth every 6 (six) hours as needed for moderate pain.    Yes [provider]  valACYclovir (VALTREX) 500 MG tablet TAKE 1 TABLET DAILY 12/08/17  Yes Allayne Butcher B, PA-C  zolpidem (AMBIEN) 10 MG tablet Take 10 mg by mouth at bedtime as needed for sleep.   Yes [provider]  ZOVIRAX 5 % APPLY 1 APPLICATION TOPICALLY AS NEEDED Patient taking differently: APPLY 1 APPLICATION TOPICALLY twice daily AS NEEDED for outbreaks 06/03/16  Yes Pickard, Priscille Heidelberg, MD  albuterol (PROVENTIL HFA;VENTOLIN HFA) 108 (90 Base) MCG/ACT inhaler Inhale 2 puffs into the lungs every 6 (six) hours as needed for wheezing or shortness of breath. 03/24/18   Danelle Berry, PA-C  HYDROcodone-homatropine (HYCODAN) 5-1.5 MG/5ML syrup Take 5 mLs by mouth every 8 (eight) hours as needed for cough. 03/24/18   Danelle Berry, PA-C  levofloxacin (LEVAQUIN) 750 MG tablet Take 1 tablet (750 mg total) by mouth daily for 5 days. 03/24/18 03/29/18  Danelle Berry, PA-C  predniSONE (DELTASONE) 20 MG tablet Take 2 tablets (40 mg total) by mouth daily with breakfast for 5 days. 03/24/18 03/29/18  Danelle Berry, PA-C     Allergies  Allergen Reactions  . Milk-Related Compounds Diarrhea    Cannot drink plain milk, but can eat ice cream, and milk in cereal  . Norco [Hydrocodone-Acetaminophen] Other (See Comments)    Headaches  . Oxycodone-Acetaminophen Itching    Nightmares     Family History  Problem Relation Age of Onset  . Arthritis Mother   . Asthma Mother   . Hearing loss Mother   . Hyperlipidemia Mother   . Hypertension Mother   . Miscarriages / [place] Mother   .  Stroke Mother   . Hearing loss Father   . Hypertension Father   . Stroke Father   . Diabetes Father   . Bell's palsy Father   . Cancer Brother        leukemia  . Early death Brother   . Arthritis Maternal Grandmother   . Depression Maternal Grandmother   . Diabetes Maternal Grandmother   . Heart disease Maternal Grandmother        CHF  . Miscarriages / Stillbirths Maternal Grandmother   . Vision loss Maternal Grandmother   . Alcohol abuse Maternal Grandfather   . Heart attack Maternal Grandfather   . Arthritis Paternal Grandmother   . Heart disease Paternal Grandmother        massive heart attack  . Miscarriages / Stillbirths Paternal Grandmother   . Alcohol abuse Paternal Grandfather   . Stroke Paternal Grandfather   . Heart disease Paternal Grandfather        arteriosclerosis     Social History   Socioeconomic History  . Marital status: Married    Spouse name: Not on file  . Number of children: Not on file  . Years of education: Not on file  . Highest education level: Not on file  Occupational History  . Not on file  Social Needs  . Financial resource strain: Not on file  . Food insecurity:    Worry: Not on file      Inability: Not on file  . Transportation needs:    Medical: Not on file    Non-medical: Not on file  Tobacco Use  . Smoking status: Former Smoker    Years: 0.50    Types: Cigarettes  . Smokeless tobacco: Never Used  Substance and Sexual Activity  . Alcohol use: No  . Drug use: No  . Sexual activity: Not Currently    Birth control/protection: Surgical    Comment: spouse vasectomy  Lifestyle  . Physical activity:    Days per week: Not on file    Minutes per session: Not on file  . Stress: Not on file  Relationships  . Social connections:    Talks on phone: Not on file    Gets together: Not on file    Attends religious service: Not on file    Active member of club or organization: Not on file    Attends meetings of clubs or organizations: Not on file    Relationship status: Not on file  . Intimate partner violence:    Fear of current or ex partner: Not on file    Emotionally abused: Not on file    Physically abused: Not on file    Forced sexual activity: Not on file  Other Topics Concern  . Not on file  Social History Narrative  . Not on file     Review of Systems  Constitutional: Positive for activity change and chills. Negative for unexpected weight change.  HENT: Positive for congestion. Negative for ear discharge and ear pain.   Eyes: Negative.   Respiratory: Positive for cough, chest tightness and shortness of breath. Negative for apnea, choking, wheezing and stridor.   Cardiovascular: Negative.  Negative for chest pain, palpitations and leg swelling.  Gastrointestinal: Negative.  Negative for abdominal pain, diarrhea, nausea and vomiting.  Endocrine: Negative.   Genitourinary: Negative.   Musculoskeletal: Positive for back pain (chronic). Negative for myalgias, neck pain and neck stiffness.  Skin: Negative.  Negative for color change, pallor and rash.  Allergic/Immunologic: Positive for  environmental allergies. Negative for food allergies and  immunocompromised state.  Neurological: Positive for headaches. Negative for dizziness, syncope, weakness, light-headedness and numbness.  Hematological: Negative.   Psychiatric/Behavioral: Negative.   All other systems reviewed and are negative.      Objective:    Vitals:   03/24/18 1057  BP: (!) 142/98  Pulse: 78  Temp: 98 F (36.7 C)  TempSrc: Oral  SpO2: 97%  Weight: 198 lb 12.8 oz (90.2 kg)  Height: 5\' 6"  (1.676 m)        Physical Exam  Constitutional: [name] oriented to person, place, and time. She appears well-developed and well-nourished.  Non-toxic appearance. No distress.  HENT:  Head: Normocephalic and atraumatic.  Right Ear: External ear normal.  Left Ear: External ear normal.  Nose: Nose normal.  Mouth/Throat: Uvula is midline, oropharynx is clear and moist and mucous membranes are normal.  Eyes: Pupils are equal, round, and reactive to light. Conjunctivae, EOM and lids are normal. No scleral icterus.  Neck: Normal range of motion and phonation normal. Neck supple. No tracheal deviation present.  Cardiovascular: Normal rate, regular rhythm, normal heart sounds and normal pulses. Exam reveals no gallop and no friction rub.  No murmur heard. Pulses:      Radial pulses are 2+ on the right side, and 2+ on the left side.       Posterior tibial pulses are 2+ on the right side, and 2+ on the left side.  Pulmonary/Chest: Effort normal. No stridor. No respiratory distress. She has no rhonchi. She exhibits no tenderness.   frequent coughing fits, no tachypnea, no retractions Breath sounds diminished bilaterally in mid to lower lung fields Coarse crackles in the left lower lung field lateral and posterior auscultation  Abdominal: Soft. Normal appearance and bowel sounds are normal. She exhibits no distension. There is no tenderness. There is no rebound and no guarding.  Musculoskeletal: Normal range of motion. She exhibits no edema or deformity.  Lymphadenopathy:     She has no cervical adenopathy.  Neurological: [name] alert and oriented to person, place, and time. She exhibits normal muscle tone. Gait normal.  Skin: Skin is warm, dry and intact. Capillary refill takes less than 2 seconds. No rash noted. [name] not diaphoretic. No pallor.  Psychiatric: She has a normal mood and affect. Her speech is normal and behavior is normal.  Nursing note and vitals reviewed.         Assessment & Plan:     ICD-10-CM   1. Community acquired pneumonia of left lower lobe of lung (HCC) J18.1 ipratropium-albuterol (DUONEB) 0.5-2.5 (3) MG/3ML nebulizer solution 3 mL    levofloxacin (LEVAQUIN) 750 MG tablet    HYDROcodone-homatropine (HYCODAN) 5-1.5 MG/5ML syrup  2. Exacerbation of asthma, unspecified asthma severity, unspecified whether persistent J45.901 ipratropium-albuterol (DUONEB) 0.5-2.5 (3) MG/3ML nebulizer solution 3 mL    albuterol (PROVENTIL HFA;VENTOLIN HFA) 108 (90 Base) MCG/ACT inhaler    predniSONE (DELTASONE) 20 MG tablet   Lungs reexamined after breathing treatment, she then had end expiratory wheezes bilaterally at the bases and persistent fine crackles left lower lung fields, tx for both CAP and asthma exacerbation.  F/up if not improving in one week. Levaquin rx'd due to comorbidities and failed Zpak tx.  Danelle BerryLeisa Aylyn Wenzler, PA-C 03/24/18 3:35 PM

## 2018-03-24 NOTE — Patient Instructions (Addendum)
Community-Acquired Pneumonia, Adult Pneumonia is an infection of the lungs. One type of pneumonia can happen while a person is in a hospital. A different type can happen when a person is not in a hospital (community-acquired pneumonia). It is easy for this kind to spread from person to person. It can spread to you if you breathe near an infected person who coughs or sneezes. Some symptoms include:  A dry cough.  A wet (productive) cough.  Fever.  Sweating.  Chest pain.  Follow these instructions at home:  Take over-the-counter and prescription medicines only as told by your doctor. ? Only take cough medicine if you are losing sleep. ? If you were prescribed an antibiotic medicine, take it as told by your doctor. Do not stop taking the antibiotic even if you start to feel better.  Sleep with your head and neck raised (elevated). You can do this by putting a few pillows under your head, or you can sleep in a recliner.  Do not use tobacco products. These include cigarettes, chewing tobacco, and e-cigarettes. If you need help quitting, ask your doctor.  Drink enough water to keep your pee (urine) clear or pale yellow. A shot (vaccine) can help prevent pneumonia. Shots are often suggested for:  People older than 63 years of age.  People older than 63 years of age: ? Who are having cancer treatment. ? Who have long-term (chronic) lung disease. ? Who have problems with their body's defense system (immune system).  You may also prevent pneumonia if you take these actions:  Get the flu (influenza) shot every year.  Go to the dentist as often as told.  Wash your hands often. If soap and water are not available, use hand sanitizer.  Contact a doctor if:  You have a fever.  You lose sleep because your cough medicine does not help. Get help right away if:  You are short of breath and it gets worse.  You have more chest pain.  Your sickness gets worse. This is very serious  if: ? You are an older adult. ? Your body's defense system is weak.  You cough up blood. This information is not intended to replace advice given to you by your health care provider. Make sure you discuss any questions you have with your health care provider. Document Released: 05/13/2008 Document Revised: 05/02/2016 Document Reviewed: 03/22/2015 Elsevier Interactive Patient Education  2018 [employer].   Asthma, Adult Asthma is a recurring condition in which the airways tighten and narrow. Asthma can make it difficult to breathe. It can cause coughing, wheezing, and shortness of breath. Asthma episodes, also called asthma attacks, range from minor to life-threatening. Asthma cannot be cured, but medicines and lifestyle changes can help control it. What are the causes? Asthma is believed to be caused by inherited (genetic) and environmental factors, but its exact cause is unknown. Asthma may be triggered by allergens, lung infections, or irritants in the air. Asthma triggers are different for each person. Common triggers include:  Animal dander.  Dust mites.  Cockroaches.  Pollen from trees or grass.  Mold.  Smoke.  Air pollutants such as dust, household cleaners, hair sprays, aerosol sprays, paint fumes, strong chemicals, or strong odors.  Cold air, weather changes, and winds (which increase molds and pollens in the air).  Strong emotional expressions such as crying or laughing hard.  Stress.  Certain medicines (such as aspirin) or types of drugs (such as beta-blockers).  Sulfites in foods and drinks. Foods  and drinks that may contain sulfites include dried fruit, potato chips, and sparkling grape juice.  Infections or inflammatory conditions such as the flu, a cold, or an inflammation of the nasal membranes (rhinitis).  Gastroesophageal reflux disease (GERD).  Exercise or strenuous activity.  What are the signs or symptoms? Symptoms may occur immediately after asthma  is triggered or many hours later. Symptoms include:  Wheezing.  Excessive nighttime or early morning coughing.  Frequent or severe coughing with a common cold.  Chest tightness.  Shortness of breath.  How is this diagnosed? The diagnosis of asthma is made by a review of your medical history and a physical exam. Tests may also be performed. These may include:  Lung function studies. These tests show how much air you breathe in and out.  Allergy tests.  Imaging tests such as X-rays.  How is this treated? Asthma cannot be cured, but it can usually be controlled. Treatment involves identifying and avoiding your asthma triggers. It also involves medicines. There are 2 classes of medicine used for asthma treatment:  Controller medicines. These prevent asthma symptoms from occurring. They are usually taken every day.  Reliever or rescue medicines. These quickly relieve asthma symptoms. They are used as needed and provide short-term relief.  Your health care provider will help you create an asthma action plan. An asthma action plan is a written plan for managing and treating your asthma attacks. It includes a list of your asthma triggers and how they may be avoided. It also includes information on when medicines should be taken and when their dosage should be changed. An action plan may also involve the use of a device called a peak flow meter. A peak flow meter measures how well the lungs are working. It helps you monitor your condition. Follow these instructions at home:  Take medicines only as directed by your health care provider. Speak with your health care provider if you have questions about how or when to take the medicines.  Use a peak flow meter as directed by your health care provider. Record and keep track of readings.  Understand and use the action plan to help minimize or stop an asthma attack without needing to seek medical care.  Control your home environment in the  following ways to help prevent asthma attacks: ? Do not smoke. Avoid being exposed to secondhand smoke. ? Change your heating and air conditioning filter regularly. ? Limit your use of fireplaces and wood stoves. ? Get rid of pests (such as roaches and mice) and their droppings. ? Throw away plants if you see mold on them. ? Clean your floors and dust regularly. Use unscented cleaning products. ? Try to have someone else vacuum for you regularly. Stay out of rooms while they are being vacuumed and for a short while afterward. If you vacuum, use a dust mask from a hardware store, a double-layered or microfilter vacuum cleaner bag, or a vacuum cleaner with a HEPA filter. ? Replace carpet with wood, tile, or vinyl flooring. Carpet can trap dander and dust. ? Use allergy-proof pillows, mattress covers, and box spring covers. ? Wash bed sheets and blankets every week in hot water and dry them in a dryer. ? Use blankets that are made of polyester or cotton. ? Clean bathrooms and kitchens with bleach. If possible, have someone repaint the walls in these rooms with mold-resistant paint. Keep out of the rooms that are being cleaned and painted. ? Wash hands frequently. Contact a health  care provider if:  You have wheezing, shortness of breath, or a cough even if taking medicine to prevent attacks.  The colored mucus you cough up (sputum) is thicker than usual.  Your sputum changes from clear or white to yellow, green, gray, or bloody.  You have any problems that may be related to the medicines you are taking (such as a rash, itching, swelling, or trouble breathing).  You are using a reliever medicine more than 2-3 times per week.  Your peak flow is still at 50-79% of your personal best after following your action plan for 1 hour.  You have a fever. Get help right away if:  You seem to be getting worse and are unresponsive to treatment during an asthma attack.  You are short of breath even at  rest.  You get short of breath when doing very little physical activity.  You have difficulty eating, drinking, or talking due to asthma symptoms.  You develop chest pain.  You develop a fast heartbeat.  You have a bluish color to your lips or fingernails.  You are light-headed, dizzy, or faint.  Your peak flow is less than 50% of your personal best. This information is not intended to replace advice given to you by your health care provider. Make sure you discuss any questions you have with your health care provider. Document Released: 11/25/2005 Document Revised: 05/08/2016 Document Reviewed: 06/24/2013 Elsevier Interactive Patient Education  2017 [employer] Inc[employer]   Upper Respiratory Infection, Adult Most upper respiratory infections (URIs) are caused by a virus. A URI affects the nose, throat, and upper air passages. The most common type of URI is often called "the common cold." Follow these instructions at home:  Take medicines only as told by your doctor.  Gargle warm saltwater or take cough drops to comfort your throat as told by your doctor.  Use a warm mist humidifier or inhale steam from a shower to increase air moisture. This may make it easier to breathe.  Drink enough fluid to keep your pee (urine) clear or pale yellow.  Eat soups and other clear broths.  Have a healthy diet.  Rest as needed.  Go back to work when your fever is gone or your doctor says it is okay. ? You may need to stay home longer to avoid giving your URI to others. ? You can also wear a face mask and wash your hands often to prevent spread of the virus.  Use your inhaler more if you have asthma.  Do not use any tobacco products, including cigarettes, chewing tobacco, or electronic cigarettes. If you need help quitting, ask your doctor. Contact a doctor if:  You are getting worse, not better.  Your symptoms are not helped by medicine.  You have chills.  You are getting more short of  breath.  You have brown or red mucus.  You have yellow or brown discharge from your nose.  You have pain in your face, especially when you bend forward.  You have a fever.  You have puffy (swollen) neck glands.  You have pain while swallowing.  You have white areas in the back of your throat. Get help right away if:  You have very bad or constant: ? Headache. ? Ear pain. ? Pain in your forehead, behind your eyes, and over your cheekbones (sinus pain). ? Chest pain.  You have long-lasting (chronic) lung disease and any of the following: ? Wheezing. ? Long-lasting cough. ? Coughing up blood. ? A  change in your usual mucus.  You have a stiff neck.  You have changes in your: ? Vision. ? Hearing. ? Thinking. ? Mood. This information is not intended to replace advice given to you by your health care provider. Make sure you discuss any questions you have with your health care provider. Document Released: 05/13/2008 Document Revised: 07/28/2016 Document Reviewed: 03/02/2014 Elsevier Interactive Patient Education  2018 [employer].

## 2018-03-24 NOTE — Telephone Encounter (Signed)
Patient informed testing was negative. Verbalized understanding.

## 2018-03-30 ENCOUNTER — Telehealth: Payer: Self-pay | Admitting: Family Medicine

## 2018-03-30 NOTE — Telephone Encounter (Signed)
Most coughs tend to last several weeks.  Continue to treat the symptom with cough syrup, take mucinex and drink plenty of fluids.  IF she is having nasal symptoms that can continue to trigger her cough.  She can treat allergies if shes worse outside.  She can add a cool mist humidifier.  But mostly it should just gradually improve.   She should come in to see us to be rechecked if she develops fever, sweats, fatigue, worsening cough frequency, sputum with SOB/CP.   But continued cough for several weeks is normal, annoying, but normal.

## 2018-03-30 NOTE — Telephone Encounter (Signed)
Spoke with patient and informed her per Sheliah MendsLeisa Tapia,PA- Most coughs tend to last several weeks.  Continue to treat the symptom with cough syrup, take mucinex and drink plenty of fluids.  IF she is having nasal symptoms that can continue to trigger her cough.  She can treat allergies if shes worse outside.  She can add a cool mist humidifier.  But mostly it should just gradually improve.   She should come in to see us to be rechecked if she develops fever, sweats, fatigue, worsening cough frequency, sputum with SOB/CP.   But continued cough for several weeks is normal, annoying, but normal. Patient verbalized understanding.

## 2018-03-30 NOTE — Telephone Encounter (Signed)
Patient called in with c/o non productive hacking cough, and headache with cough. Denies fever, and SOB.Patient stated that she has finished her Levaquin,and prednisone and has a few dose of Hycodan cough syrup. Please advise?

## 2018-04-16 ENCOUNTER — Other Ambulatory Visit: Payer: Self-pay | Admitting: Physician Assistant

## 2018-04-29 ENCOUNTER — Encounter: Payer: Self-pay | Admitting: Physician Assistant

## 2018-04-29 ENCOUNTER — Ambulatory Visit (INDEPENDENT_AMBULATORY_CARE_PROVIDER_SITE_OTHER): Admitting: Physician Assistant

## 2018-04-29 VITALS — BP 148/90 | HR 61 | Temp 98.2°F | Resp 14 | Ht 66.0 in | Wt 199.0 lb

## 2018-04-29 DIAGNOSIS — G43001 Migraine without aura, not intractable, with status migrainosus: Secondary | ICD-10-CM

## 2018-04-29 MED ORDER — KETOROLAC TROMETHAMINE 60 MG/2ML IM SOLN
60.0000 mg | Freq: Once | INTRAMUSCULAR | Status: AC
  Administered 2018-04-29: 60 mg via INTRAMUSCULAR

## 2018-04-29 MED ORDER — PROMETHAZINE HCL 25 MG/ML IJ SOLN
25.0000 mg | Freq: Once | INTRAMUSCULAR | Status: AC
  Administered 2018-04-29: 25 mg via INTRAMUSCULAR

## 2018-04-29 NOTE — Addendum Note (Signed)
Addended by: Phineas Semen A on: 04/29/2018 10:04 AM   Modules accepted: Orders

## 2018-04-29 NOTE — Progress Notes (Signed)
Patient was in office and received ketorolac  in left ventrogluteal and tolerated well, and received promethazine  in right ventrogluteal and tolerated well

## 2018-04-29 NOTE — Progress Notes (Signed)
Patient ID: MAEBEL MARASCO MRN: [id number], DOB: 20-Dec-1955, 62 y.o. Date of Encounter: @  Chief Complaint:  Chief Complaint  Patient presents with  . Migraine    HPI: 62 y.o. year old female  presents with migraine.   She reports that many years ago she had significant problem with migraines and was treated with multiple different preventive medications.  She reports that more recently that usually she has been able to abort any migraine by using Fioricet.  Says that with this episode that she had run out of her Fioricet so that she was also using some Excedrin Migraine until she can get more Fioricet.  Has also been doing other things that usually help to abort migraine.  Has been keeping house in a cool temperature has been playing peaceful music/background sound. Has made sure to avoid any perfumes and certain foods that are triggers for her.  Despite all of this, migraine has been persisting for 6 days now.  Has a throbbing pulsatile headache behind her right eye.  Has positive photophobia and phonophobia and nausea. No other concerns to address.   Past Medical History:  Diagnosis Date  . AC (acromioclavicular) joint bone spurs, right   . Allergy    seasonal  . Anxiety   . Asthma    asthmatic bronchitis  . Bipolar 1 disorder, depressed (HCC)   . Cataract 2007  . Depression   . GERD (gastroesophageal reflux disease)   . Herpes simplex type 2 infection   . High cholesterol   . Hyperlipidemia   . Hypertension   . Plantar fasciitis of right foot   . Prediabetes   . Rotator cuff tear    right shoulder     Home Meds: Outpatient Medications Prior to Visit  Medication Sig Dispense Refill  . albuterol (PROVENTIL HFA;VENTOLIN HFA) 108 (90 Base) MCG/ACT inhaler Inhale 2 puffs into the lungs every 6 (six) hours as needed for wheezing or shortness of breath.    . APPLE CIDER VINEGAR PO Take by mouth as needed.     Marland Kitchen atorvastatin (LIPITOR) 40 MG tablet Take 1 tablet  (40 mg total) by mouth daily. 90 tablet 3  . Butalbital-APAP-Caffeine 50-300-40 MG CAPS TAKE 1 TO 2 CAPSULES EVERY 4 HOURS AS NEEDED. NOT MORE THAN 6 CAPSULES DAILY 90 capsule 1  . celecoxib (CELEBREX) 200 MG capsule TAKE 1 CAPSULE AS NEEDED 90 capsule 1  . Cholecalciferol (VITAMIN D3) 5000 units CAPS Take 5,000 Units by mouth daily.     . clobetasol (TEMOVATE) 0.05 % external solution APPLY 1 APPLICATION TOPICALLY TWICE A DAY (Patient taking differently: APPLY 1 APPLICATION TOPICALLY TWICE A DAY/as needed) 150 mL 3  . Clobetasol Propionate 0.05 % lotion APPLY 1 APPLICATION TOPICALLY THREE TIMES A DAY AS NEEDED 59 mL 1  . diphenoxylate-atropine (LOMOTIL) 2.5-0.025 MG tablet Take 1 tablet by mouth as needed for diarrhea or loose stools. 30 tablet 5  . Flaxseed, Linseed, (FLAXSEED OIL MAX STR) 1300 MG CAPS Take 1,300 mg by mouth 2 (two) times daily.     Marland Kitchen HYDROcodone-homatropine (HYCODAN) 5-1.5 MG/5ML syrup Take 5 mLs by mouth every 8 (eight) hours as needed for cough. 120 mL 0  . lamoTRIgine (LAMICTAL) 25 MG tablet Take 1 tablet (25 mg total) by mouth daily. (Patient taking differently: Take by mouth. Patient taking  in morning and  at night) 14 tablet 0  . LATUDA 80 MG TABS tablet Take 80 mg by mouth daily.     Marland Kitchen  LORazepam (ATIVAN) 0.5 MG tablet Take 0.5 mg by mouth 2 (two) times daily.    . meclizine (ANTIVERT) 25 MG tablet TAKE 1 TABLET AS NEEDED FOR DIZZINESS 90 tablet 0  . methocarbamol (ROBAXIN) 500 MG tablet Take 500 mg by mouth 4 (four) times daily.    . metoprolol succinate (TOPROL-XL) 100 MG 24 hr tablet TAKE 1 TABLET AT BEDTIME 90 tablet 1  . mometasone (NASONEX) 50 MCG/ACT nasal spray Place 2 sprays into the nose daily as needed (for allergies).     . NYSTATIN powder APPLY  POWDER TOPICALLY 4 TIMES DAILY 15 g 2  . promethazine (PHENERGAN) 25 MG tablet Take 1 tablet (25 mg total) by mouth every 6 (six) hours as needed for nausea or vomiting. 30 tablet 1  . traMADol (ULTRAM) 50 MG  tablet Take 50 mg by mouth every 6 (six) hours as needed for moderate pain.     . valACYclovir (VALTREX) 500 MG tablet TAKE 1 TABLET DAILY 90 tablet 0  . zolpidem (AMBIEN) 10 MG tablet Take 10 mg by mouth at bedtime as needed for sleep.    Marland Kitchen ZOVIRAX 5 % APPLY 1 APPLICATION TOPICALLY AS NEEDED (Patient taking differently: APPLY 1 APPLICATION TOPICALLY twice daily AS NEEDED for outbreaks) 15 g 3  . albuterol (PROVENTIL HFA;VENTOLIN HFA) 108 (90 Base) MCG/ACT inhaler Inhale 2 puffs into the lungs every 6 (six) hours as needed for wheezing or shortness of breath. 1 Inhaler 0  . hydrOXYzine (ATARAX/VISTARIL) 25 MG tablet Take 1 tablet (25 mg total) by mouth 3 (three) times daily as needed. (Patient taking differently: Take 25 mg by mouth 3 (three) times daily. ) 30 tablet 1   No facility-administered medications prior to visit.     Allergies:  Allergies  Allergen Reactions  . Milk-Related Compounds Diarrhea    Cannot drink plain milk, but can eat ice cream, and milk in cereal  . Norco [Hydrocodone-Acetaminophen] Other (See Comments)    Headaches  . Oxycodone-Acetaminophen Itching    Nightmares    Social History   Socioeconomic History  . Marital status: Married    Spouse name: Not on file  . Number of children: Not on file  . Years of education: Not on file  . Highest education level: Not on file  Occupational History  . Not on file  Social Needs  . Financial resource strain: Not on file  . Food insecurity:    Worry: Not on file    Inability: Not on file  . Transportation needs:    Medical: Not on file    Non-medical: Not on file  Tobacco Use  . Smoking status: Former Smoker    Years: 0.50    Types: Cigarettes  . Smokeless tobacco: Never Used  Substance and Sexual Activity  . Alcohol use: No  . Drug use: No  . Sexual activity: Not Currently    Birth control/protection: Surgical    Comment: spouse vasectomy  Lifestyle  . Physical activity:    Days per week: Not on file      Minutes per session: Not on file  . Stress: Not on file  Relationships  . Social connections:    Talks on phone: Not on file    Gets together: Not on file    Attends religious service: Not on file    Active member of club or organization: Not on file    Attends meetings of clubs or organizations: Not on file    Relationship status: Not on  file  . Intimate partner violence:    Fear of current or ex partner: Not on file    Emotionally abused: Not on file    Physically abused: Not on file    Forced sexual activity: Not on file  Other Topics Concern  . Not on file  Social History Narrative  . Not on file    Family History  Problem Relation Age of Onset  . Arthritis Mother   . Asthma Mother   . Hearing loss Mother   . Hyperlipidemia Mother   . Hypertension Mother   . Miscarriages / [place] Mother   . Stroke Mother   . Hearing loss Father   . Hypertension Father   . Stroke Father   . Diabetes Father   . Bell's palsy Father   . Cancer Brother        leukemia  . Early death Brother   . Arthritis Maternal Grandmother   . Depression Maternal Grandmother   . Diabetes Maternal Grandmother   . Heart disease Maternal Grandmother        CHF  . Miscarriages / Stillbirths Maternal Grandmother   . Vision loss Maternal Grandmother   . Alcohol abuse Maternal Grandfather   . Heart attack Maternal Grandfather   . Arthritis Paternal Grandmother   . Heart disease Paternal Grandmother        massive heart attack  . Miscarriages / Stillbirths Paternal Grandmother   . Alcohol abuse Paternal Grandfather   . Stroke Paternal Grandfather   . Heart disease Paternal Grandfather        arteriosclerosis     Review of Systems:  See HPI for pertinent ROS. All other ROS negative.    Physical Exam: Blood pressure (!) 148/90, pulse 61, temperature 98.2 F (36.8 C), temperature source Oral, resp. rate 14, height  (1.676 m), weight 90.3 kg (199 lb), SpO2 99 %., Body mass index is  32.12 kg/m. General: WF. Appears in no acute distress. Neck: Supple. No thyromegaly. No lymphadenopathy. Lungs: Clear bilaterally to auscultation without wheezes, rales, or rhonchi. Breathing is unlabored. Heart: RRR with S1 S2. No murmurs, rubs, or gallops. Musculoskeletal:  Strength and tone normal for age. Extremities/Skin: Warm and dry.  Neuro: Alert and oriented X 3. Moves all extremities spontaneously. Gait is normal. CNII-XII grossly in tact. Romberg normal. Strength intact in all 4 extremities. Psych:  Responds to questions appropriately with a normal affect.     ASSESSMENT AND PLAN:  62 y.o. year old female with  1. Migraine without aura and with status migrainosus, not intractable She does have her husband here to drive her. At this time will give Toradol 60 mg IM and Phenergan 25 mg IM.  [name] to follow-up if symptoms do not resolve with this.   [address] [place], [place], [facility] 04/29/2018 9:36 AM

## 2018-05-05 ENCOUNTER — Other Ambulatory Visit: Payer: Self-pay | Admitting: Physician Assistant

## 2018-05-21 ENCOUNTER — Encounter: Payer: Self-pay | Admitting: Physician Assistant

## 2018-05-21 ENCOUNTER — Ambulatory Visit (INDEPENDENT_AMBULATORY_CARE_PROVIDER_SITE_OTHER): Admitting: Physician Assistant

## 2018-05-21 VITALS — BP 136/82 | HR 63 | Temp 98.4°F | Resp 16 | Ht 66.0 in | Wt 204.0 lb

## 2018-05-21 DIAGNOSIS — E785 Hyperlipidemia, unspecified: Secondary | ICD-10-CM | POA: Diagnosis not present

## 2018-05-21 DIAGNOSIS — F3162 Bipolar disorder, current episode mixed, moderate: Secondary | ICD-10-CM | POA: Diagnosis not present

## 2018-05-21 DIAGNOSIS — I10 Essential (primary) hypertension: Secondary | ICD-10-CM

## 2018-05-21 DIAGNOSIS — G43009 Migraine without aura, not intractable, without status migrainosus: Secondary | ICD-10-CM | POA: Diagnosis not present

## 2018-05-21 DIAGNOSIS — G43909 Migraine, unspecified, not intractable, without status migrainosus: Secondary | ICD-10-CM | POA: Insufficient documentation

## 2018-05-21 NOTE — Progress Notes (Signed)
Patient ID: Peggy Fitzgerald MRN: [id number], DOB: 1956/02/14, 62 y.o. Date of Encounter: @DATE @  Chief Complaint:  Chief Complaint  Patient presents with  . discuss weight loss  . discuss migraines  . blood work    nonfasting    HPI: 62 y.o. year old female  presents with above.   She states that it past visit with Dr. Tanya Nones she had mentioned to him about getting on phentermine to help with weight loss.  Says that at that time he discussed making some other changes first.  Today she states that she has made some changes so wanted to re-discuss possibly using that medicine.  Says that she was eating better biscuit with jelly and has stopped that.  Also has been trying to eat openface sandwiches which is one piece of bread instead of 2 pieces of bread.  Has decreased the amount of chips that she was eating and has decreased chocolate.  She also states that in the past she was on medications like Topamax etc. and all of those different medicines in the past caused different types of side effects or did not work for her etc.  She has recently been seeing commercials on TV for new medicines for migraine so he was wondering about trying 1 of those.  Also reports that recently migraines have seem to be occurring more frequently.  Tries to avoid triggers such as chocolate and be careful to avoid anything that may trigger migraine.  Also states that last year she had some labs and at that time started medicine for cholesterol and was to recheck 3 months but has not come in to recheck yet.  Is not fasting today but did want to discuss rechecking those labs while she is here for this visit.  No other concerns to address today.    Past Medical History:  Diagnosis Date  . AC (acromioclavicular) joint bone spurs, right   . Allergy    seasonal  . Anxiety   . Asthma    asthmatic bronchitis  . Bipolar 1 disorder, depressed (HCC)   . Cataract 2007  . Depression   . GERD (gastroesophageal  reflux disease)   . Herpes simplex type 2 infection   . High cholesterol   . Hyperlipidemia   . Hypertension   . Plantar fasciitis of right foot   . Prediabetes   . Rotator cuff tear    right shoulder     Home Meds: Outpatient Medications Prior to Visit  Medication Sig Dispense Refill  . albuterol (PROVENTIL HFA;VENTOLIN HFA) 108 (90 Base) MCG/ACT inhaler Inhale 2 puffs into the lungs every 6 (six) hours as needed for wheezing or shortness of breath.    . APPLE CIDER VINEGAR PO Take by mouth as needed.     Marland Kitchen atorvastatin (LIPITOR) 40 MG tablet Take 1 tablet (40 mg total) by mouth daily. 90 tablet 3  . Butalbital-APAP-Caffeine 50-300-40 MG CAPS TAKE 1 TO 2 CAPSULES EVERY 4 HOURS AS NEEDED. NOT MORE THAN 6 CAPSULES DAILY 90 capsule 1  . celecoxib (CELEBREX) 200 MG capsule TAKE 1 CAPSULE AS NEEDED 90 capsule 1  . Cholecalciferol (VITAMIN D3) 5000 units CAPS Take 5,000 Units by mouth daily.     . clobetasol (TEMOVATE) 0.05 % external solution APPLY 1 APPLICATION TOPICALLY TWICE A DAY (Patient taking differently: APPLY 1 APPLICATION TOPICALLY TWICE A DAY/as needed) 150 mL 3  . Clobetasol Propionate 0.05 % lotion APPLY 1 APPLICATION TOPICALLY THREE TIMES A DAY AS NEEDED  59 mL 1  . diphenoxylate-atropine (LOMOTIL) 2.5-0.025 MG tablet Take 1 tablet by mouth as needed for diarrhea or loose stools. 30 tablet 5  . Flaxseed, Linseed, (FLAXSEED OIL MAX STR) 1300 MG CAPS Take 1,300 mg by mouth 2 (two) times daily.     Marland Kitchen HYDROcodone-homatropine (HYCODAN) 5-1.5 MG/5ML syrup Take 5 mLs by mouth every 8 (eight) hours as needed for cough. 120 mL 0  . lamoTRIgine (LAMICTAL) 25 MG tablet Take 1 tablet (25 mg total) by mouth daily. (Patient taking differently: Take by mouth. Patient taking 100mg  in morning and 50mg  at night) 14 tablet 0  . LATUDA 80 MG TABS tablet Take 80 mg by mouth daily.     Marland Kitchen LORazepam (ATIVAN) 0.5 MG tablet Take 0.5 mg by mouth 2 (two) times daily.    . meclizine (ANTIVERT) 25 MG tablet  TAKE 1 TABLET AS NEEDED FOR DIZZINESS 90 tablet 0  . methocarbamol (ROBAXIN) 500 MG tablet Take 500 mg by mouth 4 (four) times daily.    . metoprolol succinate (TOPROL-XL) 100 MG 24 hr tablet TAKE 1 TABLET AT BEDTIME 90 tablet 1  . mometasone (NASONEX) 50 MCG/ACT nasal spray Place 2 sprays into the nose daily as needed (for allergies).     . NYSTATIN powder APPLY  POWDER TOPICALLY 4 TIMES DAILY (Patient taking differently: APPLY  POWDER TOPICALLY 4 TIMES DAILY prn) 15 g 2  . promethazine (PHENERGAN) 25 MG tablet TAKE 1 TABLET EVERY 6 HOURS AS NEEDED FOR NAUSEA OR VOMITING 90 tablet 0  . traMADol (ULTRAM) 50 MG tablet Take 50 mg by mouth every 6 (six) hours as needed for moderate pain.     . valACYclovir (VALTREX) 500 MG tablet TAKE 1 TABLET DAILY 90 tablet 0  . zolpidem (AMBIEN) 10 MG tablet Take 10 mg by mouth at bedtime as needed for sleep.    Marland Kitchen ZOVIRAX 5 % APPLY 1 APPLICATION TOPICALLY AS NEEDED (Patient taking differently: APPLY 1 APPLICATION TOPICALLY twice daily AS NEEDED for outbreaks) 15 g 3  . promethazine (PHENERGAN) 25 MG tablet Take 1 tablet (25 mg total) by mouth every 6 (six) hours as needed for nausea or vomiting. 30 tablet 1   No facility-administered medications prior to visit.     Allergies:  Allergies  Allergen Reactions  . Milk-Related Compounds Diarrhea    Cannot drink plain milk, but can eat ice cream, and milk in cereal  . Norco [Hydrocodone-Acetaminophen] Other (See Comments)    Headaches  . Oxycodone-Acetaminophen Itching    Nightmares    Social History   Socioeconomic History  . Marital status: Married    Spouse name: Not on file  . Number of children: Not on file  . Years of education: Not on file  . Highest education level: Not on file  Occupational History  . Not on file  Social Needs  . Financial resource strain: Not on file  . Food insecurity:    Worry: Not on file    Inability: Not on file  . Transportation needs:    Medical: Not on file     Non-medical: Not on file  Tobacco Use  . Smoking status: Former Smoker    Years: 0.50    Types: Cigarettes  . Smokeless tobacco: Never Used  Substance and Sexual Activity  . Alcohol use: No  . Drug use: No  . Sexual activity: Not Currently    Birth control/protection: Surgical    Comment: spouse vasectomy  Lifestyle  . Physical activity:  Days per week: Not on file    Minutes per session: Not on file  . Stress: Not on file  Relationships  . Social connections:    Talks on phone: Not on file    Gets together: Not on file    Attends religious service: Not on file    Active member of club or organization: Not on file    Attends meetings of clubs or organizations: Not on file    Relationship status: Not on file  . Intimate partner violence:    Fear of current or ex partner: Not on file    Emotionally abused: Not on file    Physically abused: Not on file    Forced sexual activity: Not on file  Other Topics Concern  . Not on file  Social History Narrative  . Not on file    Family History  Problem Relation Age of Onset  . Arthritis Mother   . Asthma Mother   . Hearing loss Mother   . Hyperlipidemia Mother   . Hypertension Mother   . Misc[place]riages / [place] Mother   . Stroke Mother   . Hearing loss Father   . Hypertension Father   . Stroke Father   . Diabetes Father   . Bell's palsy Father   . Cancer Brother        leukemia  . Early death Brother   . Arthritis Maternal Grandmother   . Depression Maternal Grandmother   . Diabetes Maternal Grandmother   . Heart disease Maternal Grandmother        CHF  . Miscarriages / Stillbirths Maternal Grandmother   . Vision loss Maternal Grandmother   . Alcohol abuse Maternal Grandfather   . Heart attack Maternal Grandfather   . Arthritis Paternal Grandmother   . Heart disease Paternal Grandmother        massive heart attack  . Miscarriages / Stillbirths Paternal Grandmother   . Alcohol abuse Paternal Grandfather   .  Stroke Paternal Grandfather   . Heart disease Paternal Grandfather        arteriosclerosis     Review of Systems:  See HPI for pertinent ROS. All other ROS negative.    Physical Exam: Blood pressure 136/82, pulse 63, temperature 98.4 F (36.9 C), temperature source Oral, resp. rate 16, height 5\' 6"  (1.676 m), weight 92.5 kg (204 lb), SpO2 97 %., Body mass index is 32.93 kg/m. General: WF. Appears in no acute distress. Neck: Supple. No thyromegaly. No lymphadenopathy. Lungs: Clear bilaterally to auscultation without wheezes, rales, or rhonchi. Breathing is unlabored. Heart: RRR with S1 S2. No murmurs, rubs, or gallops. Abdomen: Soft, non-tender, non-distended with normoactive bowel sounds. No hepatomegaly. No rebound/guarding. No obvious abdominal masses. Musculoskeletal:  Strength and tone normal for age. Extremities/Skin: Warm and dry.  Neuro: Alert and oriented X 3. Moves all extremities spontaneously. Gait is normal. CNII-XII grossly in tact. Psych:  Responds to questions appropriately with a normal affect.     ASSESSMENT AND PLAN:  62 y.o. year old female with  1. Hyperlipidemia, unspecified hyperlipidemia type Reviewed labs from 06/16/2017 showed A1c 5.6.  CBC normal.  CME T normal.  LDL was 208.  That time started Lipitor 40 mg and was to recheck labs in 3 months.  She reports that she is taking the Lipitor.  [name] to return fasting for labs tomorrow morning. - COMPLETE METABOLIC PANEL WITH GFR; Future - Lipid panel; Future  2. Migraine without aura and without status migrainosus, not  intractable Reviewed with patient that there are new medications available for migraine and I do think it is a good idea to look into seeing if any of those medications would be an appropriate treatment option for her.  We will refer her to a headache clinic to evaluate to see if these new medications would be an appropriate fit for her. - AMB referral to headache clinic  3. Essential  hypertension Blood pressure is currently stable and controlled/at goal.  Continue current medications.  Check lab above.  4. Bipolar 1 disorder, mixed, moderate (HCC) Given that she is on meds for bipolar will follow up her CME T and lipids. Discussed that given that she is on Lamictal, Latuda, lorazepam do not think that phentermine is an appropriate medication to use with her.  [name] to continue compliance with diet and exercise to control her weight.   Signed, [address] [place], [place], [facility] 05/21/2018 3:00 PM

## 2018-05-22 ENCOUNTER — Other Ambulatory Visit

## 2018-05-22 DIAGNOSIS — E785 Hyperlipidemia, unspecified: Secondary | ICD-10-CM

## 2018-05-22 LAB — EXTRA LAV TOP TUBE

## 2018-05-22 LAB — COMPLETE METABOLIC PANEL WITH GFR
AG RATIO: 1.5 (calc) (ref 1.0–2.5)
ALT: 9 U/L (ref 6–29)
AST: 14 U/L (ref 10–35)
Albumin: 4.1 g/dL (ref 3.6–5.1)
Alkaline phosphatase (APISO): 70 U/L (ref 33–130)
BUN/Creatinine Ratio: 12 (calc) (ref 6–22)
BUN: 12 mg/dL (ref 7–25)
CALCIUM: 9.3 mg/dL (ref 8.6–10.4)
CO2: 26 mmol/L (ref 20–32)
CREATININE: 1.02 mg/dL — AB (ref 0.50–0.99)
Chloride: 107 mmol/L (ref 98–110)
GFR, EST AFRICAN AMERICAN: 68 mL/min/{1.73_m2} (ref >=60)
GFR, EST NON AFRICAN AMERICAN: 58 mL/min/{1.73_m2} — AB (ref >=60)
GLOBULIN: 2.7 g/dL (ref 1.9–3.7)
Glucose, Bld: 101 mg/dL — ABNORMAL HIGH (ref 65–99)
POTASSIUM: 4.3 mmol/L (ref 3.5–5.3)
SODIUM: 142 mmol/L (ref 135–146)
TOTAL PROTEIN: 6.8 g/dL (ref 6.1–8.1)
Total Bilirubin: 0.4 mg/dL (ref 0.2–1.2)

## 2018-05-22 LAB — LIPID PANEL
CHOL/HDL RATIO: 3.4 (calc) (ref <5.0)
Cholesterol: 188 mg/dL (ref <200)
HDL: 56 mg/dL (ref >50)
LDL Cholesterol (Calc): 104 mg/dL (calc) — ABNORMAL HIGH
NON-HDL CHOLESTEROL (CALC): 132 mg/dL — AB (ref <130)
Triglycerides: 157 mg/dL — ABNORMAL HIGH (ref <150)

## 2018-06-30 ENCOUNTER — Encounter: Payer: Self-pay | Admitting: Physician Assistant

## 2018-07-08 ENCOUNTER — Other Ambulatory Visit: Payer: Self-pay | Admitting: Adult Health

## 2018-07-08 ENCOUNTER — Other Ambulatory Visit: Payer: Self-pay | Admitting: Physician Assistant

## 2018-07-31 ENCOUNTER — Other Ambulatory Visit: Payer: Self-pay | Admitting: Family Medicine

## 2018-07-31 MED ORDER — ACYCLOVIR 5 % EX CREA: TOPICAL_CREAM | CUTANEOUS | 3 refills | Status: DC

## 2018-08-28 ENCOUNTER — Encounter: Payer: Self-pay | Admitting: Physician Assistant

## 2018-09-24 ENCOUNTER — Other Ambulatory Visit: Payer: Self-pay | Admitting: Family Medicine

## 2018-10-23 ENCOUNTER — Other Ambulatory Visit: Payer: Self-pay | Admitting: Physician Assistant

## 2018-10-23 NOTE — Telephone Encounter (Signed)
Patient would like refill on her diflucan for recurring yeast  [facility] [place]  [phone]

## 2018-10-26 MED ORDER — FLUCONAZOLE 150 MG PO TABS: 150.0000 mg | ORAL_TABLET | Freq: Once | ORAL | 0 refills | Status: AC

## 2018-12-10 ENCOUNTER — Other Ambulatory Visit: Payer: Self-pay | Admitting: *Deleted

## 2018-12-10 DIAGNOSIS — R42 Dizziness and giddiness: Secondary | ICD-10-CM

## 2018-12-10 MED ORDER — METOPROLOL SUCCINATE ER 100 MG PO TB24: 100.0000 mg | ORAL_TABLET | Freq: Every day | ORAL | 1 refills | Status: DC

## 2018-12-10 MED ORDER — BUTALBITAL-APAP-CAFFEINE 50-300-40 MG PO CAPS: ORAL_CAPSULE | ORAL | 1 refills | Status: DC

## 2018-12-10 MED ORDER — MECLIZINE HCL 25 MG PO TABS: ORAL_TABLET | ORAL | 0 refills | Status: DC

## 2018-12-10 NOTE — Telephone Encounter (Signed)
Received fax requesting refill on Toprol. Meclizine and Fioricet to mail order.   Ok to refill?

## 2018-12-11 ENCOUNTER — Encounter: Payer: Self-pay | Admitting: Family Medicine

## 2018-12-11 ENCOUNTER — Ambulatory Visit (INDEPENDENT_AMBULATORY_CARE_PROVIDER_SITE_OTHER): Admitting: Family Medicine

## 2018-12-11 VITALS — BP 116/84 | HR 82 | Temp 98.6°F | Resp 16 | Ht 66.0 in | Wt 205.1 lb

## 2018-12-11 DIAGNOSIS — G43001 Migraine without aura, not intractable, with status migrainosus: Secondary | ICD-10-CM

## 2018-12-11 DIAGNOSIS — J45901 Unspecified asthma with (acute) exacerbation: Secondary | ICD-10-CM | POA: Diagnosis not present

## 2018-12-11 DIAGNOSIS — J329 Chronic sinusitis, unspecified: Secondary | ICD-10-CM | POA: Diagnosis not present

## 2018-12-11 DIAGNOSIS — J209 Acute bronchitis, unspecified: Secondary | ICD-10-CM | POA: Diagnosis not present

## 2018-12-11 MED ORDER — HYDROCODONE-HOMATROPINE 5-1.5 MG/5ML PO SYRP: 5.0000 mL | ORAL_SOLUTION | Freq: Three times a day (TID) | ORAL | 0 refills | Status: DC | PRN

## 2018-12-11 MED ORDER — ALBUTEROL SULFATE HFA 108 (90 BASE) MCG/ACT IN AERS: 2.0000 | INHALATION_SPRAY | RESPIRATORY_TRACT | 0 refills | Status: DC | PRN

## 2018-12-11 MED ORDER — DOXYCYCLINE HYCLATE 100 MG PO TABS: 100.0000 mg | ORAL_TABLET | Freq: Two times a day (BID) | ORAL | 0 refills | Status: DC

## 2018-12-11 MED ORDER — MONTELUKAST SODIUM 10 MG PO TABS: 10.0000 mg | ORAL_TABLET | Freq: Every day | ORAL | 3 refills | Status: DC

## 2018-12-11 MED ORDER — PREDNISONE 20 MG PO TABS: 40.0000 mg | ORAL_TABLET | Freq: Every day | ORAL | 0 refills | Status: AC

## 2018-12-11 MED ORDER — BENZONATATE 100 MG PO CAPS: 100.0000 mg | ORAL_CAPSULE | Freq: Three times a day (TID) | ORAL | 0 refills | Status: DC | PRN

## 2018-12-11 MED ORDER — ALBUTEROL SULFATE HFA 108 (90 BASE) MCG/ACT IN AERS: 2.0000 | INHALATION_SPRAY | RESPIRATORY_TRACT | 2 refills | Status: DC | PRN

## 2018-12-11 MED ORDER — AZITHROMYCIN 250 MG PO TABS: ORAL_TABLET | ORAL | 0 refills | Status: DC

## 2018-12-11 MED ORDER — IPRATROPIUM-ALBUTEROL 0.5-2.5 (3) MG/3ML IN SOLN
3.0000 mL | Freq: Once | RESPIRATORY_TRACT | Status: AC
  Administered 2018-12-11: 3 mL via RESPIRATORY_TRACT

## 2018-12-11 MED ORDER — PROMETHAZINE HCL 25 MG/ML IJ SOLN
25.0000 mg | Freq: Once | INTRAMUSCULAR | Status: AC
  Administered 2018-12-11: 25 mg via INTRAMUSCULAR

## 2018-12-11 MED ORDER — KETOROLAC TROMETHAMINE 60 MG/2ML IM SOLN
60.0000 mg | Freq: Once | INTRAMUSCULAR | Status: AC
  Administered 2018-12-11: 60 mg via INTRAMUSCULAR

## 2018-12-11 MED ORDER — DOXYCYCLINE HYCLATE 100 MG PO TABS: 100.0000 mg | ORAL_TABLET | Freq: Two times a day (BID) | ORAL | 0 refills | Status: AC

## 2018-12-11 NOTE — Progress Notes (Signed)
Patient ID: Peggy Fitzgerald, female    DOB: 1956-04-14, 63 y.o.   MRN: [id number]  PCP: Donita BrooksPickard, Warren T, MD  Chief Complaint  Patient presents with  . Cough    Patient in with c/o cough, nasal congestion, chest congestion, sore throat, migraine, and fatigue.. Denies fever and body aches.    Subjective:   Peggy LiterCynthia I Kissoon is a 63 y.o. female, presents to clinic with CC of 4 days of URI symptoms with congestion in her head and chest with severe sore throat, severe coughing with difficulty breathing, productive cough with green sputum, has been worsening since onset.  She states that coughing fits are causing her to have headaches and have triggered her migraines which have caused her to have severe migraines nausea and has been in bed for 2 days, no improvement with over-the-counter medications, DayQuil and NyQuil.   States that she is having difficulty swallowing because her so painful and scratchy and sore from postnasal drip and coughing.   She denies any fever, hot cold chills, night sweats body aches or pain with breathing.  She mostly been in bed for the past 2 days because of migraine.   She describes her cough is causing it to be "hard to breathe sometimes" but she is unsure if she is having wheeze she denies any chest pain, back pain, hemoptysis.  She says when she gets like this she usually requires "migraine shots" in clinic.  In reviewing past visit with per past PCP - Frazier RichardsMary Beth Dixon, she has previously received phenergan and toradol IM injection.  She has ride home today, husband is here with her.     She has a hx of asthma in chart.  She has inhalers but hasn't used them, says she "doesn't know when she needs to use it"  She also states other times she has been ill like this - hycodan has been very effective.    Patient Active Problem List   Diagnosis Date Noted  . Migraine 05/21/2018  . Low back pain without sciatica 07/01/2017  . Urinary tract infection without  hematuria 07/01/2017  . Vaginal dryness 07/01/2017  . Urinary frequency 07/01/2017  . Bipolar 1 disorder, mixed, moderate (HCC) 08/28/2016  . Insomnia 12/18/2015  . Headache 12/18/2015  . Vertigo 12/18/2015  . Allergy   . Anxiety   . Asthma   . Depression   . Prediabetes   . GERD (gastroesophageal reflux disease)   . Hyperlipidemia   . Hypertension   . Herpes simplex type 2 infection   . SHOULDER PAIN 04/24/2009  . IMPINGEMENT SYNDROME 04/24/2009     Prior to Admission medications   Medication Sig Start Date End Date Taking? Authorizing Provider  acyclovir cream (ZOVIRAX) 5 % APPLY 1 APPLICATION TOPICALLY twice daily AS NEEDED for outbreaks 07/31/18  Yes Dixon, Mary B, PA-C  albuterol (PROVENTIL HFA;VENTOLIN HFA) 108 (90 Base) MCG/ACT inhaler Inhale 2 puffs into the lungs every 6 (six) hours as needed for wheezing or shortness of breath.   Yes [provider]  atorvastatin (LIPITOR) 40 MG tablet TAKE 1 TABLET DAILY 09/24/18  Yes Dixon, Mary B, PA-C  Butalbital-APAP-Caffeine 50-300-40 MG CAPS TAKE 1 TO 2 CAPSULES EVERY 4 HOURS AS NEEDED. NOT MORE THAN 6 CAPSULES DAILY 12/10/18  Yes Donita BrooksPickard, Warren T, MD  celecoxib (CELEBREX) 200 MG capsule TAKE 1 CAPSULE AS NEEDED 12/12/16  Yes Donita BrooksPickard, Warren T, MD  Cholecalciferol (VITAMIN D3) 5000 units CAPS Take 5,000 Units by mouth daily.  Yes [provider]  clobetasol (TEMOVATE) 0.05 % external solution APPLY 1 APPLICATION TOPICALLY TWICE A DAY Patient taking differently: APPLY 1 APPLICATION TOPICALLY TWICE A DAY/as needed 07/14/17  Yes Dixon, Mary B, PA-C  Clobetasol Propionate 0.05 % lotion APPLY 1 APPLICATION TOPICALLY THREE TIMES A DAY AS NEEDED 12/08/17  Yes Dorena Bodoixon, Mary B, PA-C  diphenoxylate-atropine (LOMOTIL) 2.5-0.025 MG tablet Take 1 tablet by mouth as needed for diarrhea or loose stools. 08/23/16  Yes Dixon, Mary B, PA-C  Flaxseed, Linseed, (FLAXSEED OIL MAX STR) 1300 MG CAPS Take 1,300 mg by mouth 2 (two) times daily.     Yes [provider]  HYDROcodone-homatropine (HYCODAN) 5-1.5 MG/5ML syrup Take 5 mLs by mouth every 8 (eight) hours as needed for cough. 03/24/18  Yes Danelle Berryapia, Pansie Guggisberg, PA-C  lamoTRIgine (LAMICTAL) 25 MG tablet Take 1 tablet (25 mg total) by mouth daily. Patient taking differently: Take by mouth. Patient taking 100mg  in morning and 50mg  at night 08/30/16  Yes Withrow, John C, FNP  LATUDA 80 MG TABS tablet Take 80 mg by mouth daily.  02/11/17  Yes [provider]  LORazepam (ATIVAN) 0.5 MG tablet Take 0.5 mg by mouth 2 (two) times daily.   Yes [provider]  meclizine (ANTIVERT) 25 MG tablet TAKE 1 TABLET AS NEEDED FOR DIZZINESS 12/10/18  Yes Donita BrooksPickard, Warren T, MD  methocarbamol (ROBAXIN) 500 MG tablet Take 500 mg by mouth 4 (four) times daily.   Yes [provider]  metoprolol succinate (TOPROL-XL) 100 MG 24 hr tablet Take 1 tablet (100 mg total) by mouth at bedtime. Take with or immediately following a meal. 12/10/18  Yes Pickard, Priscille HeidelbergWarren T, MD  mometasone (NASONEX) 50 MCG/ACT nasal spray Place 2 sprays into the nose daily as needed (for allergies).    Yes [provider]  NYSTATIN powder APPLY  POWDER TOPICALLY 4 TIMES DAILY Patient taking differently: APPLY  POWDER TOPICALLY 4 TIMES DAILY prn 03/18/18  Yes Dixon, Mary B, PA-C  promethazine (PHENERGAN) 25 MG tablet TAKE 1 TABLET EVERY 6 HOURS AS NEEDED FOR NAUSEA OR VOMITING 07/08/18  Yes Allayne Butcherixon, Mary B, PA-C  traMADol (ULTRAM) 50 MG tablet Take 50 mg by mouth every 6 (six) hours as needed for moderate pain.    Yes [provider]  valACYclovir (VALTREX) 500 MG tablet TAKE 1 TABLET DAILY 07/08/18  Yes Allayne Butcherixon, Mary B, PA-C  zolpidem (AMBIEN) 10 MG tablet Take 10 mg by mouth at bedtime as needed for sleep.   Yes [provider]     Allergies  Allergen Reactions  . Milk-Related Compounds Diarrhea    Cannot drink plain milk, but can eat ice cream, and milk in cereal  . Norco  [Hydrocodone-Acetaminophen] Other (See Comments)    Headaches  . Oxycodone-Acetaminophen Itching    Nightmares     Family History  Problem Relation Age of Onset  . Arthritis Mother   . Asthma Mother   . Hearing loss Mother   . Hyperlipidemia Mother   . Hypertension Mother   . Miscarriages / [place] Mother   . Stroke Mother   . Hearing loss Father   . Hypertension Father   . Stroke Father   . Diabetes Father   . Bell's palsy Father   . Cancer Brother        leukemia  . Early death Brother   . Arthritis Maternal Grandmother   . Depression Maternal Grandmother   . Diabetes Maternal Grandmother   . Heart disease Maternal Grandmother  CHF  . Miscarriages / Stillbirths Maternal Grandmother   . Vision loss Maternal Grandmother   . Alcohol abuse Maternal Grandfather   . Heart attack Maternal Grandfather   . Arthritis Paternal Grandmother   . Heart disease Paternal Grandmother        massive heart attack  . Miscarriages / Stillbirths Paternal Grandmother   . Alcohol abuse Paternal Grandfather   . Stroke Paternal Grandfather   . Heart disease Paternal Grandfather        arteriosclerosis     Social History   Socioeconomic History  . Marital status: Married    Spouse name: Not on file  . Number of children: Not on file  . Years of education: Not on file  . Highest education level: Not on file  Occupational History  . Not on file  Social Needs  . Financial resource strain: Not on file  . Food insecurity:    Worry: Not on file    Inability: Not on file  . Transportation needs:    Medical: Not on file    Non-medical: Not on file  Tobacco Use  . Smoking status: Former Smoker    Years: 0.50    Types: Cigarettes  . Smokeless tobacco: Never Used  Substance and Sexual Activity  . Alcohol use: No  . Drug use: No  . Sexual activity: Not Currently    Birth control/protection: Surgical    Comment: spouse vasectomy  Lifestyle  . Physical activity:    Days  per week: Not on file    Minutes per session: Not on file  . Stress: Not on file  Relationships  . Social connections:    Talks on phone: Not on file    Gets together: Not on file    Attends religious service: Not on file    Active member of club or organization: Not on file    Attends meetings of clubs or organizations: Not on file    Relationship status: Not on file  . Intimate partner violence:    Fear of current or ex partner: Not on file    Emotionally abused: Not on file    Physically abused: Not on file    Forced sexual activity: Not on file  Other Topics Concern  . Not on file  Social History Narrative  . Not on file     Review of Systems  Constitutional: Negative.  Negative for chills, diaphoresis, fatigue and fever.  HENT: Negative.   Eyes: Negative.   Respiratory: Negative.   Cardiovascular: Negative.  Negative for chest pain, palpitations and leg swelling.  Gastrointestinal: Negative.  Negative for abdominal pain, diarrhea and vomiting.  Endocrine: Negative.   Genitourinary: Negative.   Musculoskeletal: Negative.   Skin: Negative.  Negative for color change, pallor and rash.  Allergic/Immunologic: Negative.   Neurological: Negative.   Hematological: Negative.   Psychiatric/Behavioral: Negative.   All other systems reviewed and are negative.      Objective:    Vitals:   12/11/18 0938  BP: 116/84  Pulse: 82  Resp: 16  Temp: 98.6 F (37 C)  TempSrc: Oral  SpO2: 94%  Weight: 205 lb 2 oz (93 kg)  Height: 5\' 6"  (1.676 m)      Physical Exam Vitals signs and nursing note reviewed.  Constitutional:      General: [name] not in acute distress.    Appearance: [name] well-developed. [name] ill-appearing (mildly ill ). [name] not toxic-appearing or diaphoretic.  HENT:  Head: Normocephalic and atraumatic.     Jaw: There is normal jaw occlusion.     Right Ear: Hearing, tympanic membrane, ear canal and external ear normal.     Left Ear: Hearing,  tympanic membrane, ear canal and external ear normal.     Nose: Mucosal edema, congestion and rhinorrhea present.     Right Turbinates: Enlarged and swollen.     Left Turbinates: Enlarged and swollen.     Right Sinus: Maxillary sinus tenderness and frontal sinus tenderness present.     Left Sinus: Maxillary sinus tenderness and frontal sinus tenderness present.     Mouth/Throat:     Mouth: Mucous membranes are moist. Mucous membranes are not pale. No oral lesions or angioedema.     Tongue: No lesions.     Palate: No mass and lesions.     Pharynx: Uvula midline. Posterior oropharyngeal erythema (moderately injected and erythematous) present. No pharyngeal swelling, oropharyngeal exudate or uvula swelling.     Tonsils: No tonsillar exudate or tonsillar abscesses. Swelling: 0 on the right. 0 on the left.     Comments: Airway intact, no trismus, no stridor Scratchy voice, able to speak and swallow Eyes:     General: Lids are normal. No scleral icterus.       Right eye: No discharge.        Left eye: No discharge.     Conjunctiva/sclera: Conjunctivae normal.     Pupils: Pupils are equal, round, and reactive to light.  Neck:     Musculoskeletal: Normal range of motion and neck supple.     Trachea: No tracheal deviation.  Cardiovascular:     Rate and Rhythm: Normal rate and regular rhythm.     Chest Wall: PMI is not displaced.     Pulses: Normal pulses.          Radial pulses are 2+ on the right side and 2+ on the left side.     Heart sounds: Normal heart sounds. No murmur. No friction rub. No gallop.   Pulmonary:     Effort: No tachypnea, accessory muscle usage or respiratory distress.     Breath sounds: No stridor. Examination of the right-middle field reveals rhonchi and rales. Examination of the left-middle field reveals rhonchi and rales. Examination of the right-lower field reveals decreased breath sounds. Examination of the left-lower field reveals decreased breath sounds. Decreased  breath sounds, rhonchi and rales present. No wheezing.     Comments: Splinted inspirations with coarse crackles and scattered rhonchi to bilateral mid lung fields and very diminished breath sounds bilaterally at the bases likely due to poor inspiratory effort, deep respirations patient has coughing fits Abdominal:     General: Bowel sounds are normal. There is no distension.     Palpations: Abdomen is soft.  Musculoskeletal: Normal range of motion.     Right lower leg: No edema.     Left lower leg: No edema.  Lymphadenopathy:     Cervical: No cervical adenopathy.  Skin:    General: Skin is warm and dry.     Coloration: Skin is not pale.     Findings: No rash.  Neurological:     Mental Status: [name] alert.     Motor: No abnormal muscle tone.     Coordination: Coordination normal.  Psychiatric:        Behavior: Behavior normal.           Assessment & Plan:      ICD-10-CM  1. Migraine without aura and with status migrainosus, not intractable G43.001 promethazine (PHENERGAN) injection 25 mg    ketorolac (TORADOL) injection 60 mg  2. Acute bronchitis, unspecified organism J20.9 ipratropium-albuterol (DUONEB) 0.5-2.5 (3) MG/3ML nebulizer solution 3 mL    predniSONE (DELTASONE) 20 MG tablet    albuterol (PROVENTIL HFA;VENTOLIN HFA) 108 (90 Base) MCG/ACT inhaler    HYDROcodone-homatropine (HYCODAN) 5-1.5 MG/5ML syrup    benzonatate (TESSALON) 100 MG capsule    albuterol (PROVENTIL HFA;VENTOLIN HFA) 108 (90 Base) MCG/ACT inhaler    DISCONTINUED: azithromycin (ZITHROMAX) 250 MG tablet  3. Exacerbation of asthma, unspecified asthma severity, unspecified whether persistent J45.901 predniSONE (DELTASONE) 20 MG tablet    albuterol (PROVENTIL HFA;VENTOLIN HFA) 108 (90 Base) MCG/ACT inhaler    montelukast (SINGULAIR) 10 MG tablet    albuterol (PROVENTIL HFA;VENTOLIN HFA) 108 (90 Base) MCG/ACT inhaler  4. Rhinosinusitis J32.9 doxycycline (VIBRA-TABS) 100 MG tablet   suspect allergies and  infection, tx for acute bacterial sinusitis due to sinus ttp and severe sx    Pt re-examined after breathing tx, breath sounds improved in b/l mid lung fields with decreased coarse BS, and b/l lower lung fields could hear improved aeration with coarse rales.  Also exp wheeze audible.  Abx chosen to cover acute bacterial sinusitis and possible early CAP, but I am suspicious she may have some atelectasis or bronchiolitis  Close follow up in the next 1-2 weeks if not improving.   SpO2 95%, HR 85, RR 16  Danelle Berry, PA-C 12/11/18 10:01 AM

## 2018-12-11 NOTE — Patient Instructions (Signed)
Recommend using cold and allergy medicines and mucinex with steroids, antibiotics and use inhaler frequently.  The other prescription cough medicines are to be used as needed for cough symptoms.    Cough and breathing should get mostly better (75-80% in the next 5 days) but expect cough and some nasal symptoms to persist and gradually get better over the next couple weeks.  Follow up if not improving  Follow up ASAP if worsening or any difficulty breathing despite steroids and inhaler use.

## 2018-12-14 ENCOUNTER — Encounter: Payer: Self-pay | Admitting: Family Medicine

## 2019-02-08 ENCOUNTER — Telehealth: Payer: Self-pay | Admitting: Family Medicine

## 2019-02-08 ENCOUNTER — Other Ambulatory Visit: Payer: Self-pay | Admitting: Family Medicine

## 2019-02-08 MED ORDER — FLUCONAZOLE 150 MG PO TABS: 150.0000 mg | ORAL_TABLET | Freq: Once | ORAL | 0 refills | Status: AC

## 2019-02-08 NOTE — Telephone Encounter (Signed)
LMOVM to call and schedule apt 

## 2019-02-08 NOTE — Telephone Encounter (Signed)
Patient calling to get refill on her diflucan if possible  [facility] [place]

## 2019-02-08 NOTE — Telephone Encounter (Signed)
Has not had since 10/2018 - ok to refill or ov?

## 2019-02-08 NOTE — Telephone Encounter (Signed)
Due for check up for her chronic medical conditions, I haven't seen since 2018.  Needs PCP if not me.

## 2019-03-09 ENCOUNTER — Other Ambulatory Visit: Payer: Self-pay | Admitting: Family Medicine

## 2019-03-24 ENCOUNTER — Telehealth: Payer: Self-pay | Admitting: Family Medicine

## 2019-03-24 NOTE — Telephone Encounter (Signed)
Pt would like Diflucan called in for yeast under her breast and her belly. She has used nystatin cream and powder with no relief in symptoms. I informed pt that she needed to be seen and she informed me that she is staying with her parents as both of them were tested positive for COVID-19. If we could call out diflucan x 2 would be great and then she will make an apt to come in once this is over.  CB# [phone] Pharm W_M [facility]odoan

## 2019-03-25 ENCOUNTER — Other Ambulatory Visit: Payer: Self-pay | Admitting: Family Medicine

## 2019-03-25 MED ORDER — FLUCONAZOLE 150 MG PO TABS: 150.0000 mg | ORAL_TABLET | Freq: Once | ORAL | 0 refills | Status: AC

## 2019-03-30 ENCOUNTER — Ambulatory Visit (INDEPENDENT_AMBULATORY_CARE_PROVIDER_SITE_OTHER): Admitting: Family Medicine

## 2019-03-30 ENCOUNTER — Other Ambulatory Visit: Payer: Self-pay

## 2019-03-30 DIAGNOSIS — Z20828 Contact with and (suspected) exposure to other viral communicable diseases: Secondary | ICD-10-CM | POA: Diagnosis not present

## 2019-03-30 DIAGNOSIS — J988 Other specified respiratory disorders: Principal | ICD-10-CM

## 2019-03-30 NOTE — Progress Notes (Signed)
Subjective:    Patient ID: Peggy Fitzgerald, female    DOB: 1955-12-16, 63 y.o.   MRN: [id number]  HPI Patient is being seen today as a telephone visit.  Patient is at home.  I am currently my office.  She consents to be seen over the telephone.  Phone call began at 249.  Phone call ended at 305.  Father became ill towards the end of March.  On April 1 he was referred by his PCP to the hospital where he tested positive for COVID 19.  He was hospitalized for 1 week.  The patient's mother had gastrointestinal symptoms towards the end of March and was diagnosed with diverticulitis.  However on March 13 through March 17 she was hospitalized and was found to have COVID as well.  The patient was last around her mother and her father on Sunday, April 19.  Currently the patient is completely asymptomatic however she has questions about whether she needs to be quarantined.  The patient has apparently been around both her mother and her father for the last 22 days.  Although taking proper precautions she has no symptoms and therefore she thinks she is immune.  She questions if she needs to quarantine.  She also has a daughter visiting her with her grandchild and her husband in the home. Past Medical History:  Diagnosis Date  . AC (acromioclavicular) joint bone spurs, right   . Allergy    seasonal  . Anxiety   . Asthma    asthmatic bronchitis  . Bipolar 1 disorder, depressed (HCC)   . Cataract 2007  . Depression   . GERD (gastroesophageal reflux disease)   . Herpes simplex type 2 infection   . High cholesterol   . Hyperlipidemia   . Hypertension   . Plantar fasciitis of right foot   . Prediabetes   . Rotator cuff tear    right shoulder   Past Surgical History:  Procedure Laterality Date  . CATARACT EXTRACTION, BILATERAL    . CHOLECYSTECTOMY  1987  . KNEE SURGERY Right    Current Outpatient Medications on File Prior to Visit  Medication Sig Dispense Refill  . acyclovir cream (ZOVIRAX) 5 %  APPLY 1 APPLICATION TOPICALLY twice daily AS NEEDED for outbreaks 15 g 3  . albuterol (PROVENTIL HFA;VENTOLIN HFA) 108 (90 Base) MCG/ACT inhaler Inhale 2 puffs into the lungs every 4 (four) hours as needed for wheezing or shortness of breath. 1 Inhaler 2  . albuterol (PROVENTIL HFA;VENTOLIN HFA) 108 (90 Base) MCG/ACT inhaler Inhale 2 puffs into the lungs every 4 (four) hours as needed for wheezing or shortness of breath. 1 Inhaler 0  . atorvastatin (LIPITOR) 40 MG tablet TAKE 1 TABLET DAILY 90 tablet 4  . benzonatate (TESSALON) 100 MG capsule Take 1 capsule (100 mg total) by mouth 3 (three) times daily as needed for cough. 30 capsule 0  . Butalbital-APAP-Caffeine 50-300-40 MG CAPS TAKE 1 TO 2 CAPSULES EVERY 4 HOURS AS NEEDED. NOT MORE THAN 6 CAPSULES DAILY 90 capsule 1  . celecoxib (CELEBREX) 200 MG capsule TAKE 1 CAPSULE AS NEEDED 90 capsule 1  . Cholecalciferol (VITAMIN D3) 5000 units CAPS Take 5,000 Units by mouth daily.     . clobetasol (TEMOVATE) 0.05 % external solution APPLY 1 APPLICATION TOPICALLY TWICE A DAY (Patient taking differently: APPLY 1 APPLICATION TOPICALLY TWICE A DAY/as needed) 150 mL 3  . Clobetasol Propionate 0.05 % lotion APPLY 1 APPLICATION TOPICALLY THREE TIMES A DAY AS NEEDED 59  mL 1  . diphenoxylate-atropine (LOMOTIL) 2.5-0.025 MG tablet Take 1 tablet by mouth as needed for diarrhea or loose stools. 30 tablet 5  . Flaxseed, Linseed, (FLAXSEED OIL MAX STR) 1300 MG CAPS Take 1,300 mg by mouth 2 (two) times daily.     Marland Kitchen. HYDROcodone-homatropine (HYCODAN) 5-1.5 MG/5ML syrup Take 5 mLs by mouth every 8 (eight) hours as needed for cough. 120 mL 0  . lamoTRIgine (LAMICTAL) 25 MG tablet Take 1 tablet (25 mg total) by mouth daily. (Patient taking differently: Take by mouth. Patient taking 100mg  in morning and 50mg  at night) 14 tablet 0  . LATUDA 80 MG TABS tablet Take 80 mg by mouth daily.     Marland Kitchen. LORazepam (ATIVAN) 0.5 MG tablet Take 0.5 mg by mouth 2 (two) times daily.    . meclizine  (ANTIVERT) 25 MG tablet TAKE 1 TABLET AS NEEDED FOR DIZZINESS 90 tablet 0  . methocarbamol (ROBAXIN) 500 MG tablet Take 500 mg by mouth 4 (four) times daily.    . metoprolol succinate (TOPROL-XL) 100 MG 24 hr tablet Take 1 tablet (100 mg total) by mouth at bedtime. Take with or immediately following a meal. 90 tablet 1  . mometasone (NASONEX) 50 MCG/ACT nasal spray Place 2 sprays into the nose daily as needed (for allergies).     . montelukast (SINGULAIR) 10 MG tablet Take 1 tablet (10 mg total) by mouth at bedtime. 30 tablet 3  . NYSTATIN powder APPLY  POWDER TOPICALLY 4 TIMES DAILY (Patient taking differently: APPLY  POWDER TOPICALLY 4 TIMES DAILY prn) 15 g 2  . promethazine (PHENERGAN) 25 MG tablet TAKE 1 TABLET EVERY 6 HOURS AS NEEDED FOR NAUSEA OR VOMITING 90 tablet 15  . traMADol (ULTRAM) 50 MG tablet Take 50 mg by mouth every 6 (six) hours as needed for moderate pain.     . valACYclovir (VALTREX) 500 MG tablet TAKE 1 TABLET DAILY 90 tablet 4  . zolpidem (AMBIEN) 10 MG tablet Take 10 mg by mouth at bedtime as needed for sleep.     No current facility-administered medications on file prior to visit.    Allergies  Allergen Reactions  . Milk-Related Compounds Diarrhea    Cannot drink plain milk, but can eat ice cream, and milk in cereal  . Norco [Hydrocodone-Acetaminophen] Other (See Comments)    Headaches  . Oxycodone-Acetaminophen Itching    Nightmares   Social History   Socioeconomic History  . Marital status: Married    Spouse name: Not on file  . Number of children: Not on file  . Years of education: Not on file  . Highest education level: Not on file  Occupational History  . Not on file  Social Needs  . Financial resource strain: Not on file  . Food insecurity:    Worry: Not on file    Inability: Not on file  . Transportation needs:    Medical: Not on file    Non-medical: Not on file  Tobacco Use  . Smoking status: Former Smoker    Years: 0.50    Types: Cigarettes   . Smokeless tobacco: Never Used  Substance and Sexual Activity  . Alcohol use: No  . Drug use: No  . Sexual activity: Not Currently    Birth control/protection: Surgical    Comment: spouse vasectomy  Lifestyle  . Physical activity:    Days per week: Not on file    Minutes per session: Not on file  . Stress: Not on file  Relationships  .  Social connections:    Talks on phone: Not on file    Gets together: Not on file    Attends religious service: Not on file    Active member of club or organization: Not on file    Attends meetings of clubs or organizations: Not on file    Relationship status: Not on file  . Intimate partner violence:    Fear of current or ex partner: Not on file    Emotionally abused: Not on file    Physically abused: Not on file    Forced sexual activity: Not on file  Other Topics Concern  . Not on file  Social History Narrative  . Not on file      Review of Systems  All other systems reviewed and are negative.      Objective:   Physical Exam No physical exam was performed as this was a telephone visit       Assessment & Plan:  COVID-19  Patient has been exposed to COVID-19 by 2 separate family members.  I have recommended that she self quarantine for 14 days from last exposure.  Date of last exposure will be documented is April 19.  I recommended that she quarantine away from her husband her daughter and her grandchild for 14 days.  I explained to the patient that she may also be an asymptomatic carrier and that although she feels well she could potentially spread the virus to other family members.  Therefore I believe a 14-day quarantine is the most prudent action at this point.

## 2019-06-08 ENCOUNTER — Other Ambulatory Visit: Payer: Self-pay | Admitting: Family Medicine

## 2019-07-15 ENCOUNTER — Other Ambulatory Visit: Payer: Self-pay | Admitting: *Deleted

## 2019-07-15 DIAGNOSIS — J45901 Unspecified asthma with (acute) exacerbation: Secondary | ICD-10-CM

## 2019-07-15 MED ORDER — MONTELUKAST SODIUM 10 MG PO TABS: 10.0000 mg | ORAL_TABLET | Freq: Every day | ORAL | 3 refills | Status: DC

## 2019-07-29 ENCOUNTER — Telehealth: Payer: Self-pay | Admitting: Family Medicine

## 2019-07-29 NOTE — Telephone Encounter (Signed)
Patient calling to ask a question about excessive constipation  [phone]

## 2019-07-30 ENCOUNTER — Other Ambulatory Visit: Payer: Self-pay | Admitting: Family Medicine

## 2019-07-30 DIAGNOSIS — R42 Dizziness and giddiness: Secondary | ICD-10-CM

## 2019-07-30 NOTE — Telephone Encounter (Signed)
Called and spoke to pt and she is having a lot of constipation issues and would like to know if she can try Linzess as her mother used that and it worked well for her. She has been using otc equate brand laxative that helps but she is having to take it tid to get relief. She does not want to take Miralax as she states she would rather take a pill.

## 2019-08-02 MED ORDER — LINACLOTIDE 145 MCG PO CAPS: 145.0000 ug | ORAL_CAPSULE | Freq: Every day | ORAL | 5 refills | Status: DC

## 2019-08-02 NOTE — Telephone Encounter (Signed)
Sure linzess 145 mcg poqday

## 2019-08-02 NOTE — Telephone Encounter (Signed)
Patient aware via vm and med sent to [facility]

## 2019-08-03 ENCOUNTER — Other Ambulatory Visit: Payer: Self-pay | Admitting: Family Medicine

## 2019-08-04 ENCOUNTER — Other Ambulatory Visit: Payer: Self-pay | Admitting: Family Medicine

## 2019-08-04 MED ORDER — NYSTATIN 100000 UNIT/GM EX POWD: CUTANEOUS | 2 refills | Status: DC

## 2019-08-04 MED ORDER — ACYCLOVIR 5 % EX CREA: TOPICAL_CREAM | CUTANEOUS | 3 refills | Status: DC

## 2019-08-15 ENCOUNTER — Other Ambulatory Visit: Payer: Self-pay | Admitting: Family Medicine

## 2019-08-27 ENCOUNTER — Other Ambulatory Visit: Payer: Self-pay | Admitting: *Deleted

## 2019-08-27 MED ORDER — VALACYCLOVIR HCL 500 MG PO TABS: 500.0000 mg | ORAL_TABLET | Freq: Every day | ORAL | 4 refills | Status: DC

## 2019-08-29 ENCOUNTER — Other Ambulatory Visit: Payer: Self-pay | Admitting: Family Medicine

## 2019-08-30 NOTE — Telephone Encounter (Signed)
Ok to refill??  Last office visit 03/30/2019.  Last refill 12/10/2018.

## 2019-09-15 ENCOUNTER — Other Ambulatory Visit: Payer: Self-pay | Admitting: Family Medicine

## 2019-09-15 MED ORDER — PROMETHAZINE HCL 25 MG PO TABS: ORAL_TABLET | ORAL | 2 refills | Status: DC

## 2019-10-11 ENCOUNTER — Other Ambulatory Visit: Payer: Self-pay | Admitting: Family Medicine

## 2019-10-11 DIAGNOSIS — J45901 Unspecified asthma with (acute) exacerbation: Secondary | ICD-10-CM

## 2019-11-11 ENCOUNTER — Ambulatory Visit (INDEPENDENT_AMBULATORY_CARE_PROVIDER_SITE_OTHER): Admitting: Family Medicine

## 2019-11-11 ENCOUNTER — Other Ambulatory Visit: Payer: Self-pay

## 2019-11-11 DIAGNOSIS — J209 Acute bronchitis, unspecified: Secondary | ICD-10-CM | POA: Diagnosis not present

## 2019-11-11 DIAGNOSIS — Z20822 Contact with and (suspected) exposure to covid-19: Secondary | ICD-10-CM

## 2019-11-11 DIAGNOSIS — J4521 Mild intermittent asthma with (acute) exacerbation: Secondary | ICD-10-CM | POA: Diagnosis not present

## 2019-11-11 DIAGNOSIS — J45901 Unspecified asthma with (acute) exacerbation: Secondary | ICD-10-CM

## 2019-11-11 MED ORDER — AZITHROMYCIN 250 MG PO TABS: ORAL_TABLET | ORAL | 0 refills | Status: DC

## 2019-11-11 MED ORDER — ALBUTEROL SULFATE HFA 108 (90 BASE) MCG/ACT IN AERS: 2.0000 | INHALATION_SPRAY | RESPIRATORY_TRACT | 1 refills | Status: DC | PRN

## 2019-11-11 MED ORDER — PREDNISONE 20 MG PO TABS: ORAL_TABLET | ORAL | 0 refills | Status: DC

## 2019-11-11 NOTE — Progress Notes (Signed)
Subjective:    Patient ID: Peggy Fitzgerald, female    DOB: [birth year], 63 y.o.   MRN: [id number]  HPI Patient is being seen today for telephone visit.  Phone call began at 1030.  Phone call concluded at 1050.  Patient consents to be seen via telephone.  Symptoms began Monday.  Symptoms include a nonproductive cough, some wheezing, some mild chest tightness.   patient has a history of asthma.  However she does not have a rescue inhaler at home.  She reports a headache due to the frequent coughing.  She reports a scratchy throat.  Patient denies any fever.  She denies any shortness of breath.  She denies any rhinorrhea or sinus pain or loss of sense of smell or taste.  She denies any nausea or vomiting or diarrhea Past Medical History:  Diagnosis Date   AC (acromioclavicular) joint bone spurs, right    Allergy    seasonal   Anxiety    Asthma    asthmatic bronchitis   Bipolar 1 disorder, depressed (HCC)    Cataract 2007   Depression    GERD (gastroesophageal reflux disease)    Herpes simplex type 2 infection    High cholesterol    Hyperlipidemia    Hypertension    Plantar fasciitis of right foot    Prediabetes    Rotator cuff tear    right shoulder   Past Surgical History:  Procedure Laterality Date   CATARACT EXTRACTION, BILATERAL     CHOLECYSTECTOMY  1987   KNEE SURGERY Right    Current Outpatient Medications on File Prior to Visit  Medication Sig Dispense Refill   acyclovir cream (ZOVIRAX) 5 % APPLY 1 APPLICATION TOPICALLY twice daily AS NEEDED for outbreaks 15 g 3   atorvastatin (LIPITOR) 40 MG tablet TAKE 1 TABLET DAILY 90 tablet 4   benzonatate (TESSALON) 100 MG capsule Take 1 capsule (100 mg total) by mouth 3 (three) times daily as needed for cough. 30 capsule 0   Butalbital-APAP-Caffeine 50-300-40 MG CAPS TAKE 1 TO 2 CAPSULES EVERY 4 HOURS AS NEEDED. NOT MORE THAN 6 CAPSULES DAILY 90 capsule 1   celecoxib (CELEBREX) 200 MG capsule TAKE 1  CAPSULE AS NEEDED 90 capsule 1   Cholecalciferol (VITAMIN D3) 5000 units CAPS Take 5,000 Units by mouth daily.      clobetasol (TEMOVATE) 0.05 % external solution APPLY 1 APPLICATION TOPICALLY TWICE A DAY (Patient taking differently: APPLY 1 APPLICATION TOPICALLY TWICE A DAY/as needed) 150 mL 3   Clobetasol Propionate 0.05 % lotion APPLY 1 APPLICATION TOPICALLY THREE TIMES A DAY AS NEEDED 59 mL 1   diphenoxylate-atropine (LOMOTIL) 2.5-0.025 MG tablet Take 1 tablet by mouth as needed for diarrhea or loose stools. 30 tablet 5   Flaxseed, Linseed, (FLAXSEED OIL MAX STR) 1300 MG CAPS Take 1,300 mg by mouth 2 (two) times daily.      HYDROcodone-homatropine (HYCODAN) 5-1.5 MG/5ML syrup Take 5 mLs by mouth every 8 (eight) hours as needed for cough. 120 mL 0   lamoTRIgine (LAMICTAL) 25 MG tablet Take 1 tablet (25 mg total) by mouth daily. (Patient taking differently: Take by mouth. Patient taking 100mg  in morning and 50mg  at night) 14 tablet 0   LATUDA 80 MG TABS tablet Take 80 mg by mouth daily.      linaclotide (LINZESS) 145 MCG CAPS capsule Take 1 capsule (145 mcg total) by mouth daily before breakfast. 30 capsule 5   LORazepam (ATIVAN) 0.5 MG tablet Take 0.5 mg by  mouth 2 (two) times daily.     meclizine (ANTIVERT) 25 MG tablet TAKE 1 TABLET AS NEEDED FOR DIZZINESS 90 tablet 3   methocarbamol (ROBAXIN) 500 MG tablet Take 500 mg by mouth 4 (four) times daily.     mometasone (NASONEX) 50 MCG/ACT nasal spray Place 2 sprays into the nose daily as needed (for allergies).      montelukast (SINGULAIR) 10 MG tablet Take 1 tablet (10 mg total) by mouth at bedtime. 30 tablet 3   nystatin (NYSTATIN) powder APPLY&nbsp;&nbsp;POWDER TOPICALLY 4 TIMES DAILY 15 g 2   promethazine (PHENERGAN) 25 MG tablet TAKE 1 TABLET EVERY 6 HOURS AS NEEDED FOR NAUSEA OR VOMITING 90 tablet 2   TOPROL XL 100 MG 24 hr tablet TAKE 1 TABLET AT BEDTIME WITH OR IMMEDIATELY FOLLOWING A MEAL  (CALL CLINIC TO SCHEDULE  APPOINTMENT FOR FUTURE REFILLS. [phone]) 90 tablet 3   traMADol (ULTRAM) 50 MG tablet Take 50 mg by mouth every 6 (six) hours as needed for moderate pain.      valACYclovir (VALTREX) 500 MG tablet Take 1 tablet (500 mg total) by mouth daily. 90 tablet 4   zolpidem (AMBIEN) 10 MG tablet Take 10 mg by mouth at bedtime as needed for sleep.     No current facility-administered medications on file prior to visit.    Allergies  Allergen Reactions   Milk-Related Compounds Diarrhea    Cannot drink plain milk, but can eat ice cream, and milk in cereal   Norco [Hydrocodone-Acetaminophen] Other (See Comments)    Headaches   Oxycodone-Acetaminophen Itching    Nightmares   Social History   Socioeconomic History   Marital status: Married    Spouse name: Not on file   Number of children: Not on file   Years of education: Not on file   Highest education level: Not on file  Occupational History   Not on file  Social Needs   Financial resource strain: Not on file   Food insecurity    Worry: Not on file    Inability: Not on file   Transportation needs    Medical: Not on file    Non-medical: Not on file  Tobacco Use   Smoking status: Former Smoker    Years: 0.50    Types: Cigarettes   Smokeless tobacco: Never Used  Substance and Sexual Activity   Alcohol use: No   Drug use: No   Sexual activity: Not Currently    Birth control/protection: Surgical    Comment: spouse vasectomy  Lifestyle   Physical activity    Days per week: Not on file    Minutes per session: Not on file   Stress: Not on file  Relationships   Social connections    Talks on phone: Not on file    Gets together: Not on file    Attends religious service: Not on file    Active member of club or organization: Not on file    Attends meetings of clubs or organizations: Not on file    Relationship status: Not on file   Intimate partner violence    Fear of current or ex partner: Not on file     Emotionally abused: Not on file    Physically abused: Not on file    Forced sexual activity: Not on file  Other Topics Concern   Not on file  Social History Narrative   Not on file      Review of Systems  All other systems reviewed  and are negative.      Objective:   Physical Exam  Physical exam is limited today as the patient is seen over the telephone however she is afebrile.  She coughs frequently during our conversation.  She also occasionally wheezes.  She denies any respiratory distress or increased work of breathing.      Assessment & Plan:  Mild intermittent asthmatic bronchitis with acute exacerbation  Acute bronchitis, unspecified organism - Plan: azithromycin (ZITHROMAX) 250 MG tablet, predniSONE (DELTASONE) 20 MG tablet, albuterol (VENTOLIN HFA) 108 (90 Base) MCG/ACT inhaler  Exacerbation of asthma, unspecified asthma severity, unspecified whether persistent - Plan: azithromycin (ZITHROMAX) 250 MG tablet, predniSONE (DELTASONE) 20 MG tablet, albuterol (VENTOLIN HFA) 108 (90 Base) MCG/ACT inhaler  Patient appears to have an asthma exacerbation secondary to bronchitis.  However diagnosis is complicated by the fact the patient is being seen as a telephone visit in order physical exam is limited.  Therefore I will treat the patient empirically with prednisone taper pack for the asthma exacerbation.  I will also use albuterol 2 puffs inhaled every 4-6 hours as needed for wheezing and cough.  I will call out a Z-Pak for possible bacterial bronchitis.  However I am unable to evaluate the patient for possible COVID-19.  Therefore I recommended that she go to [facility] today for their drive-through Covid testing.  Patient should remain quarantine until test results return.  If patient shortness of breath worsen she is instructed to go to the hospital.

## 2019-11-14 LAB — NOVEL CORONAVIRUS, NAA: SARS-CoV-2, NAA: NOT DETECTED

## 2019-11-18 ENCOUNTER — Other Ambulatory Visit: Payer: Self-pay | Admitting: *Deleted

## 2019-11-18 MED ORDER — ATORVASTATIN CALCIUM 40 MG PO TABS: 40.0000 mg | ORAL_TABLET | Freq: Every day | ORAL | 4 refills | Status: DC

## 2019-12-10 ENCOUNTER — Other Ambulatory Visit: Payer: Self-pay | Admitting: Family Medicine

## 2020-01-16 ENCOUNTER — Other Ambulatory Visit: Payer: Self-pay | Admitting: Family Medicine

## 2020-01-17 NOTE — Telephone Encounter (Signed)
Ok to refill??  Last office visit 11/11/2019.  Last refill 08/30/2019, #1 refills.

## 2020-03-16 ENCOUNTER — Telehealth: Payer: Self-pay | Admitting: Family Medicine

## 2020-03-16 DIAGNOSIS — F411 Generalized anxiety disorder: Secondary | ICD-10-CM | POA: Diagnosis not present

## 2020-03-16 DIAGNOSIS — F319 Bipolar disorder, unspecified: Secondary | ICD-10-CM | POA: Diagnosis not present

## 2020-03-17 ENCOUNTER — Other Ambulatory Visit: Payer: Self-pay

## 2020-03-17 ENCOUNTER — Ambulatory Visit (INDEPENDENT_AMBULATORY_CARE_PROVIDER_SITE_OTHER): Payer: Medicare Other | Admitting: Family Medicine

## 2020-03-17 VITALS — BP 118/78 | HR 60 | Temp 97.8°F | Resp 16 | Ht 66.0 in | Wt 208.0 lb

## 2020-03-17 DIAGNOSIS — N898 Other specified noninflammatory disorders of vagina: Secondary | ICD-10-CM | POA: Diagnosis not present

## 2020-03-17 DIAGNOSIS — E669 Obesity, unspecified: Secondary | ICD-10-CM | POA: Diagnosis not present

## 2020-03-17 DIAGNOSIS — L290 Pruritus ani: Secondary | ICD-10-CM | POA: Diagnosis not present

## 2020-03-17 MED ORDER — MEBENDAZOLE 100 MG PO CHEW: 100.0000 mg | CHEWABLE_TABLET | Freq: Once | ORAL | 0 refills | Status: AC

## 2020-03-17 NOTE — Progress Notes (Signed)
Subjective:    Patient ID: Peggy Fitzgerald, female    DOB: Feb 05, 1956, 64 y.o.   MRN: [id number]    Patient presents today with a variety of concerns.  First she reports perirectal itching that occurs every night.  She has a history of pinworms and wants treatment for pinworms.  She denies any itching during the daytime.  She denies any fecal leakage that can cause the itching.  She also reports vaginal dryness that causes pain and discomfort with intercourse despite using K-Y jelly.  She also request using phentermine for weight loss.  Of note, she has long overdue for a physical exam.  [name] overdue for mammogram, Pap smear, and fasting lab work.  [name] trying diet and exercise but has been unable to lose weight successfully with this.  Her BMI is 33. Past Medical History:  Diagnosis Date  . AC (acromioclavicular) joint bone spurs, right   . Allergy    seasonal  . Anxiety   . Asthma    asthmatic bronchitis  . Bipolar 1 disorder, depressed (HCC)   . Cataract 2007  . Depression   . GERD (gastroesophageal reflux disease)   . Herpes simplex type 2 infection   . High cholesterol   . Hyperlipidemia   . Hypertension   . Plantar fasciitis of right foot   . Prediabetes   . Rotator cuff tear    right shoulder   Past Surgical History:  Procedure Laterality Date  . CATARACT EXTRACTION, BILATERAL    . CHOLECYSTECTOMY  1987  . KNEE SURGERY Right    Current Outpatient Medications on File Prior to Visit  Medication Sig Dispense Refill  . acyclovir cream (ZOVIRAX) 5 % APPLY 1 APPLICATION TOPICALLY twice daily AS NEEDED for outbreaks 15 g 3  . albuterol (VENTOLIN HFA) 108 (90 Base) MCG/ACT inhaler Inhale 2 puffs into the lungs every 4 (four) hours as needed for wheezing or shortness of breath. 18 g 1  . atorvastatin (LIPITOR) 40 MG tablet Take 1 tablet (40 mg total) by mouth daily. 90 tablet 4  . Butalbital-APAP-Caffeine 50-300-40 MG CAPS TAKE 1 TO 2 CAPSULES EVERY 4 HOURS AS NEEDED.  NOT MORE THAN 6 CAPSULES DAILY. 90 capsule 1  . lamoTRIgine (LAMICTAL) 25 MG tablet Take 1 tablet (25 mg total) by mouth daily. (Patient taking differently: Take by mouth. Patient taking 100mg  in morning and 50mg  at night) 14 tablet 0  . LATUDA 80 MG TABS tablet Take 80 mg by mouth daily.     LORazepam (ATIVAN) 0.5 MG tablet Take 0.5 mg by mouth 2 (two) times daily.    . meclizine (ANTIVERT) 25 MG tablet TAKE 1 TABLET AS NEEDED FOR DIZZINESS 90 tablet 3  . methocarbamol (ROBAXIN) 500 MG tablet Take 500 mg by mouth 4 (four) times daily.    . mometasone (NASONEX) 50 MCG/ACT nasal spray Place 2 sprays into the nose daily as needed (for allergies).     . montelukast (SINGULAIR) 10 MG tablet Take 1 tablet (10 mg total) by mouth at bedtime. 30 tablet 3  . promethazine (PHENERGAN) 25 MG tablet TAKE 1 TABLET EVERY 6 HOURS AS NEEDED FOR NAUSEA OR VOMITING 90 tablet 14  . TOPROL XL 100 MG 24 hr tablet TAKE 1 TABLET AT BEDTIME WITH OR IMMEDIATELY FOLLOWING A MEAL  (CALL CLINIC TO SCHEDULE APPOINTMENT FOR FUTURE REFILLS. [phone]) 90 tablet 3  . traMADol (ULTRAM) 50 MG tablet Take 50 mg by mouth every 6 (six) hours as  needed for moderate pain.     . valACYclovir (VALTREX) 500 MG tablet Take 1 tablet (500 mg total) by mouth daily. 90 tablet 4  . zolpidem (AMBIEN) 10 MG tablet Take 10 mg by mouth at bedtime as needed for sleep.     No current facility-administered medications on file prior to visit.   Allergies  Allergen Reactions  . Milk-Related Compounds Diarrhea    Cannot drink plain milk, but can eat ice cream, and milk in cereal  . Norco [Hydrocodone-Acetaminophen] Other (See Comments)    Headaches  . Oxycodone-Acetaminophen Itching    Nightmares   Social History   Socioeconomic History  . Marital status: Married    Spouse name: Not on file  . Number of children: Not on file  . Years of education: Not on file  . Highest education level: Not on file  Occupational History  . Not on file    Tobacco Use  . Smoking status: Former Smoker    Years: 0.50    Types: Cigarettes  . Smokeless tobacco: Never Used  Substance and Sexual Activity  . Alcohol use: No  . Drug use: No  . Sexual activity: Not Currently    Birth control/protection: Surgical    Comment: spouse vasectomy  Other Topics Concern  . Not on file  Social History Narrative  . Not on file   Social Determinants of Health   Financial Resource Strain:   . Difficulty of Paying Living Expenses:   Food Insecurity:   . Worried About Programme researcher, broadcasting/film/video in the Last Year:   . Barista in the Last Year:   Transportation Needs:   . Freight forwarder (Medical):   Marland Kitchen Lack of Transportation (Non-Medical):   Physical Activity:   . Days of Exercise per Week:   . Minutes of Exercise per Session:   Stress:   . Feeling of Stress :   Social Connections:   . Frequency of Communication with Friends and Family:   . Frequency of Social Gatherings with Friends and Family:   . Attends Religious Services:   . Active Member of Clubs or Organizations:   . Attends Banker Meetings:   Marland Kitchen Marital Status:   Intimate Partner Violence:   . Fear of Current or Ex-Partner:   . Emotionally Abused:   Marland Kitchen Physically Abused:   . Sexually Abused:       Review of Systems  All other systems reviewed and are negative.      Objective:   Physical Exam  Constitutional: [name] oriented to person, place, and time.  Cardiovascular: Normal rate, regular rhythm and normal heart sounds.   Pulmonary/Chest: Effort normal and breath sounds normal. No respiratory distress. She has no wheezes. She has no rales.  Abdominal: Soft. Bowel sounds are normal.  Neurological: [name] alert and oriented to person, place, and time. She has normal reflexes. No cranial nerve deficit. She exhibits normal muscle tone. Coordination normal.  Vitals reviewed.  Rectal exam was performed with a chaperone.  The skin around the rectum is bleach  white almost like vitiligo.  The borders are sharply demarcated and the skin there is atrophic and excoriated.  Although we did not perform a pelvic exam, I suspect lichen sclerosis et atrophicus may be contributing to her rectal itching as well as her vaginal dryness and vaginal itching.  I explained this in detail to the patient.  I believe pinworms are highly unlikely but she would still  like to try therapy given the fact she has had in the past and it felt exactly like this.       Assessment & Plan:  Rectal itching  Obesity (BMI 30.0-34.9)  Vaginal dryness  Strongly recommended a complete physical exam.  [name] due for mammogram and Pap smear.  Furthermore I suspect that she has atrophic vaginitis causing vaginal dryness.  This could be contributing to lichen sclerosis which could be causing her perirectal and vaginal itching.  I believe she would likely benefit from an estrogen cream such as Estrace.  In the meantime, I will acquiesce to the patient's wishes and try empiric therapy for pinworms.  She will take mebendazole 100 mg p.o. x1 and repeat in 1 week.  However if the itching persists, I would focus our treatment on the treatment for lichen sclerosus and atrophic vaginitis.  Patient will schedule a physical exam at her earliest convenience and come in for fasting lab work prior to this.  I also spent 25 minutes today with the patient in total.  Part of that time was spent discussing the risk of phentermine.  My biggest concern is giving phentermine to a patient over the age of 28.  Patient continues to request phentermine to help with weight loss.  I recommended Kirke Shaggy would be a safer option.  She will check to see if her insurance covers this first.  I again explained to the patient that I am concerned about using phentermine due to the potential cardiovascular risk.

## 2020-03-20 ENCOUNTER — Telehealth: Payer: Self-pay | Admitting: Family Medicine

## 2020-03-20 MED ORDER — RELION PEN NEEDLES 32G X 4 MM MISC: 3 refills | Status: DC

## 2020-03-20 MED ORDER — SAXENDA 18 MG/3ML ~~LOC~~ SOPN: 3.0000 mg | PEN_INJECTOR | Freq: Every day | SUBCUTANEOUS | 2 refills | Status: DC

## 2020-03-20 NOTE — Telephone Encounter (Signed)
Pt called and states that her ins will cover with a prior authorization. Will send rx to [facility] and await PA paperwork.

## 2020-03-21 ENCOUNTER — Telehealth: Payer: Self-pay | Admitting: Family Medicine

## 2020-03-21 NOTE — Telephone Encounter (Signed)
Pt called and wanted to know if it was safe for her to have sex with pin worms?

## 2020-03-21 NOTE — Telephone Encounter (Signed)
After mebendazole yes

## 2020-03-22 NOTE — Telephone Encounter (Signed)
Tried to call no answer and no vm 

## 2020-03-22 NOTE — Telephone Encounter (Signed)
Pt aware of provider recommendations  

## 2020-03-23 NOTE — Telephone Encounter (Signed)
PA Submitted through [email or web address] and received the following:  [facility] [employer] is processing your inquiry and will respond shortly with next steps. You are currently using the fastest method to process this request. Please do not fax or call [facility] [employer] to resubmit this request. To check for an update later, open this request again from your dashboard.  If you need assistance, please chat with CoverMyMeds or call us at [phone]. 

## 2020-03-24 NOTE — Telephone Encounter (Signed)
PA needed more questions answered. Questions answered and sent back to [facility]

## 2020-03-24 NOTE — Telephone Encounter (Signed)
[facility] [employer] is reviewing your PA request and will respond within 24 hours for Medicaid or up to 72 hours for non-Medicaid plans, based on the required timeframe determined by state or federal regulations. To check for an update later, open this request from your dashboard.

## 2020-03-27 NOTE — Telephone Encounter (Signed)
CaseId:[id number];Status:Denied;Review Type:Prior Auth;Appeal Information: Attention:ATTN: CLINICAL APPEALS DEPARTMENT [employer] [id number]. [phone] Phone:[phone] Fax:[phone]; Important - Please read the below note on eAppeals: Please reference the denial letter for information on the rights for an appeal, rationale for the denial, and how to submit an appeal including if any information is needed to support the appeal. Note about urgent situations - Generally, an urgent situation is one which, in the opinion of the provider, the health of the patient may be in serious jeopardy or may experience pain that cannot be adequately controlled while waiting for a decision on the appeal.;

## 2020-03-29 NOTE — Telephone Encounter (Signed)
Medication has been denied - will send in appeal letter.

## 2020-03-30 NOTE — Telephone Encounter (Signed)
Appeal letter faxed.

## 2020-04-14 ENCOUNTER — Other Ambulatory Visit: Payer: Self-pay

## 2020-04-14 ENCOUNTER — Other Ambulatory Visit: Payer: Medicare Other

## 2020-04-14 DIAGNOSIS — Z0000 Encounter for general adult medical examination without abnormal findings: Secondary | ICD-10-CM | POA: Diagnosis not present

## 2020-04-14 DIAGNOSIS — Z13220 Encounter for screening for lipoid disorders: Secondary | ICD-10-CM | POA: Diagnosis not present

## 2020-04-14 DIAGNOSIS — Z136 Encounter for screening for cardiovascular disorders: Secondary | ICD-10-CM | POA: Diagnosis not present

## 2020-04-15 LAB — LIPID PANEL
Cholesterol: 187 mg/dL (ref <200)
HDL: 53 mg/dL (ref >=50)
LDL Cholesterol (Calc): 104 mg/dL (calc) — ABNORMAL HIGH
Non-HDL Cholesterol (Calc): 134 mg/dL (calc) — ABNORMAL HIGH (ref <130)
Total CHOL/HDL Ratio: 3.5 (calc) (ref <5.0)
Triglycerides: 188 mg/dL — ABNORMAL HIGH (ref <150)

## 2020-04-15 LAB — COMPREHENSIVE METABOLIC PANEL
AG Ratio: 1.6 (calc) (ref 1.0–2.5)
ALT: 12 U/L (ref 6–29)
AST: 20 U/L (ref 10–35)
Albumin: 3.9 g/dL (ref 3.6–5.1)
Alkaline phosphatase (APISO): 57 U/L (ref 37–153)
BUN/Creatinine Ratio: 17 (calc) (ref 6–22)
BUN: 19 mg/dL (ref 7–25)
CO2: 24 mmol/L (ref 20–32)
Calcium: 9.2 mg/dL (ref 8.6–10.4)
Chloride: 108 mmol/L (ref 98–110)
Creat: 1.12 mg/dL — ABNORMAL HIGH (ref 0.50–0.99)
Globulin: 2.5 g/dL (calc) (ref 1.9–3.7)
Glucose, Bld: 94 mg/dL (ref 65–99)
Potassium: 5.1 mmol/L (ref 3.5–5.3)
Sodium: 141 mmol/L (ref 135–146)
Total Bilirubin: 0.3 mg/dL (ref 0.2–1.2)
Total Protein: 6.4 g/dL (ref 6.1–8.1)

## 2020-04-15 LAB — CBC WITH DIFFERENTIAL/PLATELET
Absolute Monocytes: 614 cells/uL (ref 200–950)
Basophils Absolute: 30 cells/uL (ref 0–200)
Basophils Relative: 0.5 %
Eosinophils Absolute: 413 cells/uL (ref 15–500)
Eosinophils Relative: 7 %
HCT: 36.1 % (ref 35.0–45.0)
Hemoglobin: 12.3 g/dL (ref 11.7–15.5)
Lymphs Abs: 1628 cells/uL (ref 850–3900)
MCH: 31.6 pg (ref 27.0–33.0)
MCHC: 34.1 g/dL (ref 32.0–36.0)
MCV: 92.8 fL (ref 80.0–100.0)
MPV: 10.1 fL (ref 7.5–12.5)
Monocytes Relative: 10.4 %
Neutro Abs: 3216 cells/uL (ref 1500–7800)
Neutrophils Relative %: 54.5 %
Platelets: 220 10*3/uL (ref 140–400)
RBC: 3.89 10*6/uL (ref 3.80–5.10)
RDW: 14.3 % (ref 11.0–15.0)
Total Lymphocyte: 27.6 %
WBC: 5.9 10*3/uL (ref 3.8–10.8)

## 2020-04-18 ENCOUNTER — Ambulatory Visit (INDEPENDENT_AMBULATORY_CARE_PROVIDER_SITE_OTHER): Payer: Medicare Other | Admitting: Family Medicine

## 2020-04-18 ENCOUNTER — Other Ambulatory Visit: Payer: Self-pay

## 2020-04-18 ENCOUNTER — Encounter: Payer: Self-pay | Admitting: Family Medicine

## 2020-04-18 VITALS — BP 110/78 | HR 68 | Temp 97.1°F | Resp 16 | Ht 66.0 in | Wt 210.0 lb

## 2020-04-18 DIAGNOSIS — Z0000 Encounter for general adult medical examination without abnormal findings: Secondary | ICD-10-CM

## 2020-04-18 DIAGNOSIS — Z1231 Encounter for screening mammogram for malignant neoplasm of breast: Secondary | ICD-10-CM

## 2020-04-18 DIAGNOSIS — E669 Obesity, unspecified: Secondary | ICD-10-CM

## 2020-04-18 DIAGNOSIS — Z780 Asymptomatic menopausal state: Secondary | ICD-10-CM

## 2020-04-18 MED ORDER — METHOCARBAMOL 500 MG PO TABS: 500.0000 mg | ORAL_TABLET | Freq: Four times a day (QID) | ORAL | 1 refills | Status: DC

## 2020-04-18 MED ORDER — CLOTRIMAZOLE-BETAMETHASONE 1-0.05 % EX CREA: 1.0000 "application " | TOPICAL_CREAM | Freq: Two times a day (BID) | CUTANEOUS | 3 refills | Status: DC

## 2020-04-18 NOTE — Progress Notes (Signed)
Subjective:    Patient ID: Peggy Fitzgerald, female    DOB: 24-Jan-1956, 64 y.o.   MRN: [id number]  HPI  Patient is a 64 year old Caucasian female here today for complete physical exam.  [name] due today for a mammogram.  This has not been performed in several years.  [name] also due for colonoscopy.  [name] uncertain when she had her labs but she believes more than 10 years ago.  She refuses a colonoscopy but would be willing to consent to Cologuard.  She had a Pap smear performed last year at [facility] her report that was normal and therefore she does not require Pap smear this year.  She does need a bone density test.  She has not had this scheduled.  She denies any falls or depression or memory loss.  Regarding her immunizations she is due for Pneumovax 23, Prevnar 13, as well as the COVID-19 vaccination.  I have recommended all of the shots today however she does not feel comfortable getting the Covid vaccination yet.  I have recommended she receive Pneumovax 23 today however she left prior to receiving the vaccination. Immunization History  Administered Date(s) Administered  . Influenza,inj,Quad PF,6+ Mos 08/27/2018  . Zoster Recombinat (Shingrix) 08/27/2018, 02/10/2019   Past Medical History:  Diagnosis Date  . AC (acromioclavicular) joint bone spurs, right   . Allergy    seasonal  . Anxiety   . Asthma    asthmatic bronchitis  . Bipolar 1 disorder, depressed ([place])   . Cataract 2007  . Depression   . GERD (gastroesophageal reflux disease)   . Herpes simplex type 2 infection   . High cholesterol   . Hyperlipidemia   . Hypertension   . Plantar fasciitis of right foot   . Prediabetes   . Rotator cuff tear    right shoulder   Past Surgical History:  Procedure Laterality Date  . CATARACT EXTRACTION, BILATERAL    . CHOLECYSTECTOMY  1987  . KNEE SURGERY Right    Current Outpatient Medications on File Prior to Visit  Medication Sig Dispense Refill  . acyclovir cream  (ZOVIRAX) 5 % APPLY 1 APPLICATION TOPICALLY twice daily AS NEEDED for outbreaks 15 g 3  . albuterol (VENTOLIN HFA) 108 (90 Base) MCG/ACT inhaler Inhale 2 puffs into the lungs every 4 (four) hours as needed for wheezing or shortness of breath. 18 g 1  . atorvastatin (LIPITOR) 40 MG tablet Take 1 tablet (40 mg total) by mouth daily. 90 tablet 4  . Butalbital-APAP-Caffeine 50-300-40 MG CAPS TAKE 1 TO 2 CAPSULES EVERY 4 HOURS AS NEEDED. NOT MORE THAN 6 CAPSULES DAILY. 90 capsule 1  . Insulin Pen Needle (RELION PEN NEEDLES) 32G X 4 MM MISC Use daily with Saxenda 100 each 3  . lamoTRIgine (LAMICTAL) 25 MG tablet Take 1 tablet (25 mg total) by mouth daily. (Patient taking differently: Take by mouth. Patient taking 100mg  in morning and 50mg  at night) 14 tablet 0  . LATUDA 80 MG TABS tablet Take 80 mg by mouth daily.     Marland Kitchen LORazepam (ATIVAN) 0.5 MG tablet Take 0.5 mg by mouth 2 (two) times daily.    . meclizine (ANTIVERT) 25 MG tablet TAKE 1 TABLET AS NEEDED FOR DIZZINESS 90 tablet 3  . mometasone (NASONEX) 50 MCG/ACT nasal spray Place 2 sprays into the nose daily as needed (for allergies).     . montelukast (SINGULAIR) 10 MG tablet Take 1 tablet (10 mg total) by mouth  at bedtime. 30 tablet 3  . promethazine (PHENERGAN) 25 MG tablet TAKE 1 TABLET EVERY 6 HOURS AS NEEDED FOR NAUSEA OR VOMITING 90 tablet 14  . TOPROL XL 100 MG 24 hr tablet TAKE 1 TABLET AT BEDTIME WITH OR IMMEDIATELY FOLLOWING A MEAL  (CALL CLINIC TO SCHEDULE APPOINTMENT FOR FUTURE REFILLS. [phone]) 90 tablet 3  . traMADol (ULTRAM) 50 MG tablet Take 50 mg by mouth every 6 (six) hours as needed for moderate pain.     . valACYclovir (VALTREX) 500 MG tablet Take 1 tablet (500 mg total) by mouth daily. 90 tablet 4  . zolpidem (AMBIEN) 10 MG tablet Take 10 mg by mouth at bedtime as needed for sleep.    . Liraglutide -Weight Management (SAXENDA) 18 MG/3ML SOPN Inject 0.5 mLs (3 mg total) into the skin daily. Inject 0.6 mg into the skin daily.  Increase by 0.6mg  q week until max dose of 3 mg is reached. (Patient not taking: Reported on 04/18/2020) 5 pen 2   No current facility-administered medications on file prior to visit.   Allergies  Allergen Reactions  . Milk-Related Compounds Diarrhea    Cannot drink plain milk, but can eat ice cream, and milk in cereal  . Norco [Hydrocodone-Acetaminophen] Other (See Comments)    Headaches  . Oxycodone-Acetaminophen Itching    Nightmares   Social History   Socioeconomic History  . Marital status: Married    Spouse name: Not on file  . Number of children: Not on file  . Years of education: Not on file  . Highest education level: Not on file  Occupational History  . Not on file  Tobacco Use  . Smoking status: Former Smoker    Years: 0.50    Types: Cigarettes  . Smokeless tobacco: Never Used  Substance and Sexual Activity  . Alcohol use: No  . Drug use: No  . Sexual activity: Not Currently    Birth control/protection: Surgical    Comment: spouse vasectomy  Other Topics Concern  . Not on file  Social History Narrative  . Not on file   Social Determinants of Health   Financial Resource Strain:   . Difficulty of Paying Living Expenses:   Food Insecurity:   . Worried About Programme researcher, broadcasting/film/video in the Last Year:   . Barista in the Last Year:   Transportation Needs:   . Freight forwarder (Medical):   Marland Kitchen Lack of Transportation (Non-Medical):   Physical Activity:   . Days of Exercise per Week:   . Minutes of Exercise per Session:   Stress:   . Feeling of Stress :   Social Connections:   . Frequency of Communication with Friends and Family:   . Frequency of Social Gatherings with Friends and Family:   . Attends Religious Services:   . Active Member of Clubs or Organizations:   . Attends Banker Meetings:   Marland Kitchen Marital Status:   Intimate Partner Violence:   . Fear of Current or Ex-Partner:   . Emotionally Abused:   Marland Kitchen Physically Abused:   .  Sexually Abused:    Family History  Problem Relation Age of Onset  . Arthritis Mother   . Asthma Mother   . Hearing loss Mother   . Hyperlipidemia Mother   . Hypertension Mother   . Miscarriages / [place] Mother   . Stroke Mother   . Hearing loss Father   . Hypertension Father   . Stroke Father   .  Diabetes Father   . Bell's palsy Father   . Cancer Brother        leukemia  . Early death Brother   . Arthritis Maternal Grandmother   . Depression Maternal Grandmother   . Diabetes Maternal Grandmother   . Heart disease Maternal Grandmother        CHF  . Miscarriages / Stillbirths Maternal Grandmother   . Vision loss Maternal Grandmother   . Alcohol abuse Maternal Grandfather   . Heart attack Maternal Grandfather   . Arthritis Paternal Grandmother   . Heart disease Paternal Grandmother        massive heart attack  . Miscarriages / Stillbirths Paternal Grandmother   . Alcohol abuse Paternal Grandfather   . Stroke Paternal Grandfather   . Heart disease Paternal Grandfather        arteriosclerosis       Review of Systems  All other systems reviewed and are negative.      Objective:   Physical Exam Vitals reviewed.  Constitutional:      General: [name] not in acute distress.    Appearance: Normal appearance. [name] obese. [name] not ill-appearing, toxic-appearing or diaphoretic.  HENT:     Head: Normocephalic and atraumatic.     Right Ear: Tympanic membrane, ear canal and external ear normal. There is no impacted cerumen.     Left Ear: Tympanic membrane, ear canal and external ear normal. There is no impacted cerumen.     Nose: Nose normal. No congestion or rhinorrhea.     Mouth/Throat:     Mouth: Mucous membranes are moist.     Pharynx: Oropharynx is clear. No oropharyngeal exudate or posterior oropharyngeal erythema.  Eyes:     General: No scleral icterus.       Right eye: No discharge.        Left eye: No discharge.     Extraocular Movements: Extraocular  movements intact.     Conjunctiva/sclera: Conjunctivae normal.     Pupils: Pupils are equal, round, and reactive to light.  Neck:     Vascular: No carotid bruit.  Cardiovascular:     Rate and Rhythm: Normal rate and regular rhythm.     Pulses: Normal pulses.     Heart sounds: Normal heart sounds. No murmur. No friction rub. No gallop.   Pulmonary:     Effort: Pulmonary effort is normal. No respiratory distress.     Breath sounds: Normal breath sounds. No stridor. No wheezing, rhonchi or rales.  Chest:     Chest wall: No tenderness.  Abdominal:     General: Abdomen is flat. Bowel sounds are normal. There is no distension.     Palpations: Abdomen is soft. There is no mass.     Tenderness: There is no abdominal tenderness. There is no right CVA tenderness, left CVA tenderness, guarding or rebound.     Hernia: No hernia is present.  Musculoskeletal:     Cervical back: Normal range of motion and neck supple. No rigidity or tenderness.     Right lower leg: No edema.     Left lower leg: No edema.  Lymphadenopathy:     Cervical: No cervical adenopathy.  Skin:    Findings: No rash.  Neurological:     General: No focal deficit present.     Mental Status: [name] alert and oriented to person, place, and time. Mental status is at baseline.     Cranial Nerves: No cranial nerve deficit.  Sensory: No sensory deficit.     Motor: No weakness.     Coordination: Coordination normal.     Gait: Gait normal.     Deep Tendon Reflexes: Reflexes normal.  Psychiatric:        Mood and Affect: Mood normal.        Behavior: Behavior normal.        Thought Content: Thought content normal.        Judgment: Judgment normal.           Assessment & Plan:  Encounter for screening mammogram for malignant neoplasm of breast - Plan: MM Digital Screening  Postmenopausal estrogen deficiency - Plan: DG Bone Density  Obesity (BMI 30.0-34.9)  General medical exam  Blood pressure today is excellent.   I reviewed the patient's lab work which is significant for an elevated creatinine.  Patient states she has been taking Advil and Aleve every day for her headaches.  I recommended that she stop this and then we recheck her BMP in 1 week.  I will schedule the patient for mammogram along with bone density.  I will also schedule her for Cologuard to screen for colon cancer.  She does not require Pap smear at the present time.  Patient denies any issues with falls, depression, memory loss.  I will refill her Robaxin that she takes once or twice a day occasionally for pain.  Recommend Covid 19 vaccination.  Also recommended Pneumovax 23 however she left prior to receiving this vaccination.

## 2020-04-19 ENCOUNTER — Telehealth: Payer: Self-pay | Admitting: *Deleted

## 2020-04-19 NOTE — Telephone Encounter (Signed)
Received verbal orders for Cologuard.   Order placed via [employer].   Cologuard (Order [id number])

## 2020-04-19 NOTE — Telephone Encounter (Signed)
-----   Message from Ricard Dillon, [place] sent at 04/18/2020  3:11 PM EDT -----  ----- Message ----- From: Donita Brooks, MD Sent: 04/18/2020   3:06 PM EDT To: Ricard Dillon, RMA  Please schedule cologuard

## 2020-04-19 NOTE — Telephone Encounter (Signed)
Called and spoke to [place] in Copy department. Per [place] they did receive the consent and the appeal is still being processed. When a decision is made they will send [place] that information.

## 2020-04-24 ENCOUNTER — Other Ambulatory Visit: Payer: Self-pay | Admitting: Family Medicine

## 2020-04-24 DIAGNOSIS — J45901 Unspecified asthma with (acute) exacerbation: Secondary | ICD-10-CM

## 2020-05-01 ENCOUNTER — Telehealth: Payer: Self-pay | Admitting: Family Medicine

## 2020-05-01 NOTE — Telephone Encounter (Signed)
Patient called in requesting that we refax the letter we sent to [facility] and the form that was scanned in on 04/07/2020

## 2020-05-02 ENCOUNTER — Other Ambulatory Visit: Payer: Self-pay

## 2020-05-02 MED ORDER — SAXENDA 18 MG/3ML ~~LOC~~ SOPN: 3.0000 mg | PEN_INJECTOR | Freq: Every day | SUBCUTANEOUS | 2 refills | Status: DC

## 2020-05-02 NOTE — Telephone Encounter (Signed)
Appeals [facility]ed to [facility] and prescription resent per patient's request.

## 2020-05-09 ENCOUNTER — Ambulatory Visit (INDEPENDENT_AMBULATORY_CARE_PROVIDER_SITE_OTHER): Payer: Medicare Other | Admitting: Nurse Practitioner

## 2020-05-09 ENCOUNTER — Other Ambulatory Visit: Payer: Self-pay

## 2020-05-09 ENCOUNTER — Ambulatory Visit: Attending: Internal Medicine

## 2020-05-09 ENCOUNTER — Telehealth: Payer: Self-pay | Admitting: Family Medicine

## 2020-05-09 DIAGNOSIS — J45909 Unspecified asthma, uncomplicated: Secondary | ICD-10-CM | POA: Diagnosis not present

## 2020-05-09 DIAGNOSIS — J218 Acute bronchiolitis due to other specified organisms: Secondary | ICD-10-CM | POA: Diagnosis not present

## 2020-05-09 DIAGNOSIS — B9789 Other viral agents as the cause of diseases classified elsewhere: Secondary | ICD-10-CM

## 2020-05-09 DIAGNOSIS — Z20822 Contact with and (suspected) exposure to covid-19: Secondary | ICD-10-CM

## 2020-05-09 MED ORDER — GUAIFENESIN-DM 100-10 MG/5ML PO SYRP: 5.0000 mL | ORAL_SOLUTION | ORAL | 0 refills | Status: DC | PRN

## 2020-05-09 MED ORDER — MONTELUKAST SODIUM 10 MG PO TABS: 10.0000 mg | ORAL_TABLET | Freq: Every day | ORAL | 3 refills | Status: DC

## 2020-05-09 MED ORDER — ALBUTEROL SULFATE HFA 108 (90 BASE) MCG/ACT IN AERS: 2.0000 | INHALATION_SPRAY | RESPIRATORY_TRACT | 1 refills | Status: DC | PRN

## 2020-05-09 NOTE — Progress Notes (Signed)
Virtual Visit via Telephone Note  I connected with Peggy Fitzgerald on 05/09/20 at  9:15 AM EDT by telephone and verified that I am speaking with the correct person using two identifiers.   I discussed the limitations, risks, security and privacy concerns of performing an evaluation and management service by telephone and the availability of in person appointments. I also discussed with the patient that there may be a patient responsible charge related to this service. The patient expressed understanding and agreed to proceed.   History of Present Illness:  Patient is a 64 year old female presenting for a telephone encounter related to symptoms that started on Friday.  Her symptoms include what she calls a hacking cough.  The cough is nonproductive.  She denies fever, chills, general body aches, nasal congestion, nasal drainage, sore throat.  She denies loss of taste or loss of smell.  She denies contact with persons who are being tested for Covid or who are positive for Covid.  She does report having some nausea related to coughing so often.  The patient admits that she does have a history of asthma when asked and confirmed that she is not taking her Singulair on using her albuterol and in fact reports that she is out of these medications.  We will refill as she will use and instructed to use as directed.  She was instructed to get a Covid test today and if her symptoms persist or worsen to make an appointment to be seen in clinic.  Education provided for asthma, viral illnesses, bronchitis.  Observations/Objective: Coughing while on call, a/ox3, no wheezing audible.  Past Medical History:  Diagnosis Date  . AC (acromioclavicular) joint bone spurs, right   . Allergy    seasonal  . Anxiety   . Asthma    asthmatic bronchitis  . Bipolar 1 disorder, depressed ([facility])   . Cataract 2007  . Depression   . GERD (gastroesophageal reflux disease)   . Herpes simplex type 2 infection   . High  cholesterol   . Hyperlipidemia   . Hypertension   . Plantar fasciitis of right foot   . Prediabetes   . Rotator cuff tear    right shoulder   Past Surgical History:  Procedure Laterality Date  . CATARACT EXTRACTION, BILATERAL    . CHOLECYSTECTOMY  1987  . KNEE SURGERY Right    Current Outpatient Medications on File Prior to Visit  Medication Sig Dispense Refill  . acyclovir cream (ZOVIRAX) 5 % APPLY 1 APPLICATION TOPICALLY twice daily AS NEEDED for outbreaks 15 g 3  . atorvastatin (LIPITOR) 40 MG tablet Take 1 tablet (40 mg total) by mouth daily. 90 tablet 4  . Butalbital-APAP-Caffeine 50-300-40 MG CAPS TAKE 1 TO 2 CAPSULES EVERY 4 HOURS AS NEEDED. NOT MORE THAN 6 CAPSULES DAILY. 90 capsule 1  . clotrimazole-betamethasone (LOTRISONE) cream Apply 1 application topically 2 (two) times daily. 45 g 3  . Insulin Pen Needle (RELION PEN NEEDLES) 32G X 4 MM MISC Use daily with Saxenda 100 each 3  . lamoTRIgine (LAMICTAL) 25 MG tablet Take 1 tablet (25 mg total) by mouth daily. (Patient taking differently: Take by mouth. Patient taking 100mg  in morning and 50mg  at night) 14 tablet 0  . LATUDA 80 MG TABS tablet Take 80 mg by mouth daily.     . Liraglutide -Weight Management (SAXENDA) 18 MG/3ML SOPN Inject 0.5 mLs (3 mg total) into the skin daily. Inject 0.6 mg into the skin daily. Increase by 0.6mg  q  week until max dose of 3 mg is reached. 5 pen 2  . LORazepam (ATIVAN) 0.5 MG tablet Take 0.5 mg by mouth 2 (two) times daily.    . meclizine (ANTIVERT) 25 MG tablet TAKE 1 TABLET AS NEEDED FOR DIZZINESS 90 tablet 3  . methocarbamol (ROBAXIN) 500 MG tablet Take 1 tablet (500 mg total) by mouth 4 (four) times daily. 90 tablet 1  . mometasone (NASONEX) 50 MCG/ACT nasal spray Place 2 sprays into the nose daily as needed (for allergies).     . promethazine (PHENERGAN) 25 MG tablet TAKE 1 TABLET EVERY 6 HOURS AS NEEDED FOR NAUSEA OR VOMITING 90 tablet 14  . TOPROL XL 100 MG 24 hr tablet TAKE 1 TABLET AT  BEDTIME WITH OR IMMEDIATELY FOLLOWING A MEAL  (CALL CLINIC TO SCHEDULE APPOINTMENT FOR FUTURE REFILLS. [phone]) 90 tablet 3  . traMADol (ULTRAM) 50 MG tablet Take 50 mg by mouth every 6 (six) hours as needed for moderate pain.     . valACYclovir (VALTREX) 500 MG tablet Take 1 tablet (500 mg total) by mouth daily. 90 tablet 4  . zolpidem (AMBIEN) 10 MG tablet Take 10 mg by mouth at bedtime as needed for sleep.     No current facility-administered medications on file prior to visit.   Allergies  Allergen Reactions  . Milk-Related Compounds Diarrhea    Cannot drink plain milk, but can eat ice cream, and milk in cereal  . Norco [Hydrocodone-Acetaminophen] Other (See Comments)    Headaches  . Oxycodone-Acetaminophen Itching    Nightmares   Social History   Socioeconomic History  . Marital status: Married    Spouse name: Not on file  . Number of children: Not on file  . Years of education: Not on file  . Highest education level: Not on file  Occupational History  . Not on file  Tobacco Use  . Smoking status: Former Smoker    Years: 0.50    Types: Cigarettes  . Smokeless tobacco: Never Used  Substance and Sexual Activity  . Alcohol use: No  . Drug use: No  . Sexual activity: Not Currently    Birth control/protection: Surgical    Comment: spouse vasectomy  Other Topics Concern  . Not on file  Social History Narrative  . Not on file   Social Determinants of Health   Financial Resource Strain:   . Difficulty of Paying Living Expenses:   Food Insecurity:   . Worried About Programme researcher, broadcasting/film/video in the Last Year:   . Barista in the Last Year:   Transportation Needs:   . Freight forwarder (Medical):   Marland Kitchen Lack of Transportation (Non-Medical):   Physical Activity:   . Days of Exercise per Week:   . Minutes of Exercise per Session:   Stress:   . Feeling of Stress :   Social Connections:   . Frequency of Communication with Friends and Family:   . Frequency of  Social Gatherings with Friends and Family:   . Attends Religious Services:   . Active Member of Clubs or Organizations:   . Attends Banker Meetings:   Marland Kitchen Marital Status:   Intimate Partner Violence:   . Fear of Current or Ex-Partner:   . Emotionally Abused:   Marland Kitchen Physically Abused:   . Sexually Abused:    Breast Cancer-relatedfamily history is not on file. Immunization History  Administered Date(s) Administered  . Influenza,inj,Quad PF,6+ Mos 08/27/2018  . Zoster Recombinat (Shingrix)  08/27/2018, 02/10/2019    Assessment and Plan: Acute viral bronchiolitis - Plan: guaiFENesin-dextromethorphan (ROBITUSSIN DM) 100-10 MG/5ML syrup  Asthma, unspecified asthma severity, unspecified whether complicated, unspecified whether persistent   -1.  Get plenty of rest and drink plenty of water 2.  Covid testing today recommended 3.  Take over-the-counter medications for your symptoms.  For your cough I have prescribed guaifenesin DM as you are allergic to codeine will not prescribe hycodan and you requested related to your allergy. 4.  Treatment potential for mild resolving or worsening symptoms. 5.  I have refilled her Singulair and her albuterol inhaler as she reported you have not been taking the heparin out.  You should take your asthma.  Medications as prescribed. 6.  Your symptoms are most consistent with with bronchitis which is usually viral.  However with your history of asthma.  If your symptoms persist or do not resolve please seek medical attention promptly.  Follow Up Instructions:    I discussed the assessment and treatment plan with the patient. The patient was provided an opportunity to ask questions and all were answered. The patient agreed with the plan and demonstrated an understanding of the instructions.   The patient was advised to call back or seek an in-person evaluation if the symptoms worsen or if the condition fails to improve as anticipated.  I provided  10 minutes of non-face-to-face time during this encounter. End time [place] Thresa Dozier, FNP

## 2020-05-09 NOTE — Patient Instructions (Signed)
1.  Get plenty of rest and drink plenty of water 2.  Covid testing today recommended 3.  Take over-the-counter medications for your symptoms.  For your cough I have prescribed guaifenesin DM as you are allergic to codeine will not prescribe hycodan and you requested related to your allergy. 4.  Treatment potential for mild resolving or worsening symptoms. 5.  I have refilled her Singulair and her albuterol inhaler as she reported you have not been taking the heparin out.  You should take your asthma.  Medications as prescribed. 6.  Your symptoms are most consistent with with bronchitis which is usually viral.  However with your history of asthma.  If your symptoms persist or do not resolve please seek medical attention promptly.

## 2020-05-09 NOTE — Telephone Encounter (Signed)
Error

## 2020-05-10 ENCOUNTER — Other Ambulatory Visit: Payer: Self-pay

## 2020-05-10 LAB — SARS-COV-2, NAA 2 DAY TAT

## 2020-05-10 LAB — NOVEL CORONAVIRUS, NAA: SARS-CoV-2, NAA: DETECTED — AB

## 2020-05-10 MED ORDER — ALBUTEROL SULFATE HFA 108 (90 BASE) MCG/ACT IN AERS: 2.0000 | INHALATION_SPRAY | RESPIRATORY_TRACT | 1 refills | Status: DC | PRN

## 2020-05-11 ENCOUNTER — Other Ambulatory Visit: Payer: Self-pay | Admitting: Physician Assistant

## 2020-05-11 ENCOUNTER — Ambulatory Visit (HOSPITAL_COMMUNITY)
Admission: RE | Admit: 2020-05-11 | Discharge: 2020-05-11 | Disposition: A | Discharge status: Home / Self Care | Payer: Medicare Other | Source: Ambulatory Visit | Attending: Pulmonary Disease | Admitting: Pulmonary Disease

## 2020-05-11 DIAGNOSIS — Z23 Encounter for immunization: Secondary | ICD-10-CM | POA: Insufficient documentation

## 2020-05-11 DIAGNOSIS — J45909 Unspecified asthma, uncomplicated: Secondary | ICD-10-CM | POA: Diagnosis not present

## 2020-05-11 DIAGNOSIS — U071 COVID-19: Secondary | ICD-10-CM | POA: Insufficient documentation

## 2020-05-11 DIAGNOSIS — I10 Essential (primary) hypertension: Secondary | ICD-10-CM | POA: Insufficient documentation

## 2020-05-11 MED ORDER — SODIUM CHLORIDE 0.9 % IV SOLN: INTRAVENOUS | Status: DC | PRN

## 2020-05-11 MED ORDER — FAMOTIDINE IN NACL 20-0.9 MG/50ML-% IV SOLN: 20.0000 mg | Freq: Once | INTRAVENOUS | Status: DC | PRN

## 2020-05-11 MED ORDER — DIPHENHYDRAMINE HCL 50 MG/ML IJ SOLN: 50.0000 mg | Freq: Once | INTRAMUSCULAR | Status: DC | PRN

## 2020-05-11 MED ORDER — EPINEPHRINE 0.3 MG/0.3ML IJ SOAJ: 0.3000 mg | Freq: Once | INTRAMUSCULAR | Status: DC | PRN

## 2020-05-11 MED ORDER — SODIUM CHLORIDE 0.9 % IV SOLN
Freq: Once | INTRAVENOUS | Status: AC
  Filled 2020-05-11: qty 700

## 2020-05-11 MED ORDER — ALBUTEROL SULFATE HFA 108 (90 BASE) MCG/ACT IN AERS: 2.0000 | INHALATION_SPRAY | Freq: Once | RESPIRATORY_TRACT | Status: DC | PRN

## 2020-05-11 MED ORDER — METHYLPREDNISOLONE SODIUM SUCC 125 MG IJ SOLR: 125.0000 mg | Freq: Once | INTRAMUSCULAR | Status: DC | PRN

## 2020-05-11 NOTE — Progress Notes (Signed)
  I connected by phone with Peggy Fitzgerald on 05/11/2020 at 7:55 AM to discuss the potential use of an new treatment for mild to moderate COVID-19 viral infection in non-hospitalized patients.  This patient is a 64 y.o. female that meets the FDA criteria for Emergency Use Authorization of bamlanivimab/etesevimab or casirivimab/imdevimab.  Has a (+) direct SARS-CoV-2 viral test result  Has mild or moderate COVID-19   Is NOT hospitalized due to COVID-19  Is within 10 days of symptom onset  Has at least one of the high risk factor(s) for progression to severe COVID-19 and/or hospitalization as defined in EUA.  Specific high risk criteria : Older age (>/= 65 yo), HTN, asthma   I have spoken and communicated the following to the patient or parent/caregiver:  1. FDA has authorized the emergency use of bamlanivimab/etesevimab and casirivimab\imdevimab for the treatment of mild to moderate COVID-19 in adults and pediatric patients with positive results of direct SARS-CoV-2 viral testing who are 72 years of age and older weighing at least 40 kg, and who are at high risk for progressing to severe COVID-19 and/or hospitalization.  2. The significant known and potential risks and benefits of bamlanivimab/etesevimab and casirivimab\imdevimab, and the extent to which such potential risks and benefits are unknown.  3. Information on available alternative treatments and the risks and benefits of those alternatives, including clinical trials.  4. Patients treated with bamlanivimab/etesevimab and casirivimab\imdevimab should continue to self-isolate and use infection control measures (e.g., wear mask, isolate, social distance, avoid sharing personal items, clean and disinfect "high touch" surfaces, and frequent handwashing) according to CDC guidelines.   5. The patient or parent/caregiver has the option to accept or refuse bamlanivimab/etesevimab or casirivimab\imdevimab .  After reviewing this  information with the patient, The patient agreed to proceed with receiving the bamlanivimab/etesevimab infusion and will be provided a copy of the Fact sheet prior to receiving the infusion.  Sx onset 05/05/20. Set up for infusion today at 12:30pm.  Cline Crock 05/11/2020 7:55 AM

## 2020-05-11 NOTE — Progress Notes (Signed)
  Diagnosis: COVID-19  Physician:Dr Wright  Procedure: Covid Infusion Clinic Med: bamlanivimab\etesevimab infusion - Provided patient with bamlanimivab\etesevimab fact sheet for patients, parents and caregivers prior to infusion.  Complications: No immediate complications noted.  Discharge: Discharged home   Peggy Fitzgerald 05/11/2020  

## 2020-05-11 NOTE — Discharge Instructions (Signed)

## 2020-05-15 LAB — COLOGUARD: Cologuard: NEGATIVE

## 2020-05-21 LAB — COLOGUARD: COLOGUARD: NEGATIVE

## 2020-05-24 ENCOUNTER — Telehealth: Payer: Self-pay | Admitting: Family Medicine

## 2020-05-24 ENCOUNTER — Encounter: Payer: Self-pay | Admitting: Family Medicine

## 2020-05-24 NOTE — Telephone Encounter (Signed)
Received patient's cologuard results back from [facility]. Patient cologuard was negative.

## 2020-05-25 NOTE — Telephone Encounter (Signed)
Great, please notify patient.

## 2020-05-25 NOTE — Telephone Encounter (Signed)
Spoke with patient and informed her of cologuard results. Patient verbalized understanding.

## 2020-05-25 NOTE — Telephone Encounter (Signed)
Left message return call

## 2020-06-06 ENCOUNTER — Other Ambulatory Visit: Payer: Self-pay | Admitting: Family Medicine

## 2020-06-06 NOTE — Telephone Encounter (Signed)
Ok to refill??  Last office visit 04/18/2020  Last refill 01/17/2020, #1 refill.

## 2020-06-14 ENCOUNTER — Ambulatory Visit (INDEPENDENT_AMBULATORY_CARE_PROVIDER_SITE_OTHER): Payer: Medicare Other | Admitting: Nurse Practitioner

## 2020-06-14 ENCOUNTER — Other Ambulatory Visit: Payer: Self-pay

## 2020-06-14 VITALS — BP 150/92 | HR 65 | Temp 97.6°F | Resp 18 | Wt 202.0 lb

## 2020-06-14 DIAGNOSIS — B009 Herpesviral infection, unspecified: Secondary | ICD-10-CM

## 2020-06-14 DIAGNOSIS — R7989 Other specified abnormal findings of blood chemistry: Secondary | ICD-10-CM | POA: Diagnosis not present

## 2020-06-14 LAB — BASIC METABOLIC PANEL WITHOUT GFR
BUN/Creatinine Ratio: 15 (calc) (ref 6–22)
BUN: 18 mg/dL (ref 7–25)
CO2: 24 mmol/L (ref 20–32)
Calcium: 8.9 mg/dL (ref 8.6–10.4)
Chloride: 108 mmol/L (ref 98–110)
Creat: 1.18 mg/dL — ABNORMAL HIGH (ref 0.50–0.99)
GFR, Est African American: 56 mL/min/1.73m2 — ABNORMAL LOW
GFR, Est Non African American: 48 mL/min/1.73m2 — ABNORMAL LOW
Glucose, Bld: 87 mg/dL (ref 65–99)
Potassium: 4.6 mmol/L (ref 3.5–5.3)
Sodium: 139 mmol/L (ref 135–146)

## 2020-06-14 MED ORDER — VALACYCLOVIR HCL 500 MG PO TABS: 500.0000 mg | ORAL_TABLET | Freq: Two times a day (BID) | ORAL | 0 refills | Status: AC

## 2020-06-14 NOTE — Progress Notes (Signed)
Established Patient Office Visit  Subjective:  Patient ID: Peggy Fitzgerald, female    DOB: 06/23/1956  Age: 64 y.o. MRN: [id number]  CC:  Chief Complaint  Patient presents with  . Herpes Zoster    severe outbreak, started 07/04, acyclovir cream was applied    HPI Peggy Fitzgerald is a 64 year old female presenting for complaints of herpes 2 outbreak. Her sxs started 2 days ago. The pt take preventative 500 mg Valacyclovir once daily. She reports itching and burning that worsens when she urinates. [name] asking if she can have a refill on Ultram. The medication has not been filled in a while. She reports that this helps to relieve her general pain in her shoulder and other general pain. Per 04/2020 her PCP had instructed her to stop taking daily aleeve and Advil as she was taking habitual and with slightly elevated Creat, she was to have Bun one week later which she did not. She did however stop taking the NSAID'S she reports. The pcp filled Robaxin for her that day.    Discussed treatment plan of taking 600 mg ibuprofen prn every 8 hours for 5 days with 500 mg twice daily Valacyclovir pt understands directions. She will complete labs today.   Past Medical History:  Diagnosis Date  . AC (acromioclavicular) joint bone spurs, right   . Allergy    seasonal  . Anxiety   . Asthma    asthmatic bronchitis  . Bipolar 1 disorder, depressed (HCC)   . Cataract 2007  . Depression   . GERD (gastroesophageal reflux disease)   . Herpes simplex type 2 infection   . High cholesterol   . Hyperlipidemia   . Hypertension   . Plantar fasciitis of right foot   . Prediabetes   . Rotator cuff tear    right shoulder    Past Surgical History:  Procedure Laterality Date  . CATARACT EXTRACTION, BILATERAL    . CHOLECYSTECTOMY  1987  . KNEE SURGERY Right     Family History  Problem Relation Age of Onset  . Arthritis Mother   . Asthma Mother   . Hearing loss Mother   . Hyperlipidemia  Mother   . Hypertension Mother   . Miscarriages / [place] Mother   . Stroke Mother   . Hearing loss Father   . Hypertension Father   . Stroke Father   . Diabetes Father   . Bell's palsy Father   . Cancer Brother        leukemia  . Early death Brother   . Arthritis Maternal Grandmother   . Depression Maternal Grandmother   . Diabetes Maternal Grandmother   . Heart disease Maternal Grandmother        CHF  . Miscarriages / Stillbirths Maternal Grandmother   . Vision loss Maternal Grandmother   . Alcohol abuse Maternal Grandfather   . Heart attack Maternal Grandfather   . Arthritis Paternal Grandmother   . Heart disease Paternal Grandmother        massive heart attack  . Miscarriages / Stillbirths Paternal Grandmother   . Alcohol abuse Paternal Grandfather   . Stroke Paternal Grandfather   . Heart disease Paternal Grandfather        arteriosclerosis    Social History   Socioeconomic History  . Marital status: Married    Spouse name: Not on file  . Number of children: Not on file  . Years of education: Not on file  . Highest  education level: Not on file  Occupational History  . Not on file  Tobacco Use  . Smoking status: Former Smoker    Years: 0.50    Types: Cigarettes  . Smokeless tobacco: Never Used  Substance and Sexual Activity  . Alcohol use: No  . Drug use: No  . Sexual activity: Not Currently    Birth control/protection: Surgical    Comment: spouse vasectomy  Other Topics Concern  . Not on file  Social History Narrative  . Not on file   Social Determinants of Health   Financial Resource Strain:   . Difficulty of Paying Living Expenses:   Food Insecurity:   . Worried About Programme researcher, broadcasting/film/videounning Out of Food in the Last Year:   . Baristaan Out of Food in the Last Year:   Transportation Needs:   . Freight forwarderLack of Transportation (Medical):   Marland Kitchen. Lack of Transportation (Non-Medical):   Physical Activity:   . Days of Exercise per Week:   . Minutes of Exercise per Session:     Stress:   . Feeling of Stress :   Social Connections:   . Frequency of Communication with Friends and Family:   . Frequency of Social Gatherings with Friends and Family:   . Attends Religious Services:   . Active Member of Clubs or Organizations:   . Attends BankerClub or Organization Meetings:   Marland Kitchen. Marital Status:   Intimate Partner Violence:   . Fear of Current or Ex-Partner:   . Emotionally Abused:   Marland Kitchen. Physically Abused:   . Sexually Abused:     Outpatient Medications Prior to Visit  Medication Sig Dispense Refill  . acyclovir cream (ZOVIRAX) 5 % APPLY 1 APPLICATION TOPICALLY twice daily AS NEEDED for outbreaks 15 g 3  . albuterol (VENTOLIN HFA) 108 (90 Base) MCG/ACT inhaler Inhale 2 puffs into the lungs every 4 (four) hours as needed for wheezing or shortness of breath. 18 g 1  . atorvastatin (LIPITOR) 40 MG tablet Take 1 tablet (40 mg total) by mouth daily. 90 tablet 4  . Butalbital-APAP-Caffeine 50-300-40 MG CAPS TAKE 1 TO 2 CAPSULES EVERY 4 HOURS AS NEEDED. NOT MORE THAN 6 CAPSULES DAILY 90 capsule 0  . clotrimazole-betamethasone (LOTRISONE) cream Apply 1 application topically 2 (two) times daily. 45 g 3  . guaiFENesin-dextromethorphan (ROBITUSSIN DM) 100-10 MG/5ML syrup Take 5 mLs by mouth every 4 (four) hours as needed for cough. 118 mL 0  . Insulin Pen Needle (RELION PEN NEEDLES) 32G X 4 MM MISC Use daily with Saxenda 100 each 3  . lamoTRIgine (LAMICTAL) 25 MG tablet Take 1 tablet (25 mg total) by mouth daily. (Patient taking differently: Take by mouth. Patient taking 100mg  in morning and 50mg  at night) 14 tablet 0  . LATUDA 80 MG TABS tablet Take 80 mg by mouth daily.     . Liraglutide -Weight Management (SAXENDA) 18 MG/3ML SOPN Inject 0.5 mLs (3 mg total) into the skin daily. Inject 0.6 mg into the skin daily. Increase by 0.6mg  q week until max dose of 3 mg is reached. 5 pen 2  . LORazepam (ATIVAN) 0.5 MG tablet Take 0.5 mg by mouth 2 (two) times daily.    . meclizine (ANTIVERT)  25 MG tablet TAKE 1 TABLET AS NEEDED FOR DIZZINESS 90 tablet 3  . methocarbamol (ROBAXIN) 500 MG tablet Take 1 tablet (500 mg total) by mouth 4 (four) times daily. 90 tablet 1  . mometasone (NASONEX) 50 MCG/ACT nasal spray Place 2 sprays into the nose  daily as needed (for allergies).     . montelukast (SINGULAIR) 10 MG tablet Take 1 tablet (10 mg total) by mouth at bedtime. 30 tablet 3  . promethazine (PHENERGAN) 25 MG tablet TAKE 1 TABLET EVERY 6 HOURS AS NEEDED FOR NAUSEA OR VOMITING 90 tablet 14  . TOPROL XL 100 MG 24 hr tablet TAKE 1 TABLET AT BEDTIME WITH OR IMMEDIATELY FOLLOWING A MEAL  (CALL CLINIC TO SCHEDULE APPOINTMENT FOR FUTURE REFILLS. [phone]) 90 tablet 3  . zolpidem (AMBIEN) 10 MG tablet Take 10 mg by mouth at bedtime as needed for sleep.    . traMADol (ULTRAM) 50 MG tablet Take 50 mg by mouth every 6 (six) hours as needed for moderate pain.     . valACYclovir (VALTREX) 500 MG tablet Take 1 tablet (500 mg total) by mouth daily. 90 tablet 4   No facility-administered medications prior to visit.    Allergies  Allergen Reactions  . Milk-Related Compounds Diarrhea    Cannot drink plain milk, but can eat ice cream, and milk in cereal  . Oxycodone-Acetaminophen Itching    Nightmares    ROS Review of Systems  All other systems reviewed and are negative.     Objective:    Physical Exam Vitals and nursing note reviewed.  Constitutional:      Appearance: Normal appearance.  HENT:     Head: Normocephalic.  Eyes:     General: No scleral icterus.    Extraocular Movements: Extraocular movements intact.     Conjunctiva/sclera: Conjunctivae normal.     Pupils: Pupils are equal, round, and reactive to light.  Cardiovascular:     Rate and Rhythm: Normal rate.  Pulmonary:     Effort: Pulmonary effort is normal.  Musculoskeletal:        General: Normal range of motion.     Cervical back: Normal range of motion and neck supple.     Right lower leg: No edema.     Left  lower leg: No edema.  Skin:    General: Skin is warm and dry.     Coloration: Skin is not jaundiced.     Findings: No bruising.  Neurological:     General: No focal deficit present.     Mental Status: [name] alert and oriented to person, place, and time.     Gait: Gait normal.  Psychiatric:        Attention and Perception: Attention and perception normal.        Mood and Affect: Mood and affect normal.        Speech: Speech normal.        Behavior: Behavior normal. Behavior is cooperative.        Thought Content: Thought content normal.        Cognition and Memory: Cognition and memory normal.        Judgment: Judgment normal.     BP (!) 150/92 (BP Location: Right Arm, Patient Position: Sitting, Cuff Size: Normal)   Pulse 65   Temp 97.6 F (36.4 C) (Temporal)   Resp 18   Wt 202 lb (91.6 kg)   SpO2 96%   BMI 32.60 kg/m  Wt Readings from Last 3 Encounters:  06/14/20 202 lb (91.6 kg)  04/18/20 210 lb (95.3 kg)  03/17/20 208 lb (94.3 kg)     Health Maintenance Due  Topic Date Due  . COVID-19 Vaccine (1) Never done  . HIV Screening  Never done  . TETANUS/TDAP  Never done  .  PAP SMEAR-Modifier  Never done  . MAMMOGRAM  Never done  . COLONOSCOPY  Never done  . DEXA SCAN  Never done  . PNA vac Low Risk Adult (1 of 2 - PCV13) Never done    There are no preventive care reminders to display for this patient.  No results found for: TSH Lab Results  Component Value Date   WBC 5.9 04/14/2020   HGB 12.3 04/14/2020   HCT 36.1 04/14/2020   MCV 92.8 04/14/2020   PLT 220 04/14/2020   Lab Results  Component Value Date   NA 141 04/14/2020   K 5.1 04/14/2020   CO2 24 04/14/2020   GLUCOSE 94 04/14/2020   BUN 19 04/14/2020   CREATININE 1.12 (H) 04/14/2020   BILITOT 0.3 04/14/2020   ALKPHOS 85 06/16/2017   AST 20 04/14/2020   ALT 12 04/14/2020   PROT 6.4 04/14/2020   ALBUMIN 4.2 06/16/2017   CALCIUM 9.2 04/14/2020   ANIONGAP 8 08/27/2016   Lab Results  Component  Value Date   CHOL 187 04/14/2020   Lab Results  Component Value Date   HDL 53 04/14/2020   Lab Results  Component Value Date   LDLCALC 104 (H) 04/14/2020   Lab Results  Component Value Date   TRIG 188 (H) 04/14/2020   Lab Results  Component Value Date   CHOLHDL 3.5 04/14/2020   Lab Results  Component Value Date   HGBA1C 5.6 06/16/2017      Assessment & Plan:   Problem List Items Addressed This Visit      Other   Herpes simplex type 2 infection - Primary   Relevant Medications   valACYclovir (VALTREX) 500 MG tablet    Other Visit Diagnoses    Elevated serum creatinine       Relevant Orders   BASIC METABOLIC PANEL WITH GFR    May take Ibuprofen 600 mg every 8 hours as needed for discomfort from HSV outbreak for the next 5 days pending the completion of labs today as you did not complete as PCP directed last visit related to slightly elevated creatinine most likely secondary to daily long term use of Advil and Aleeve which you have since stopped taking. New script for outbreak Valacyclovir to be taken 500 mg twice daily for 5 days.   Meds ordered this encounter  Medications  . valACYclovir (VALTREX) 500 MG tablet    Sig: Take 1 tablet (500 mg total) by mouth 2 (two) times daily for 5 days.    Dispense:  10 tablet    Refill:  0    Follow-up: Return if symptoms worsen or fail to improve.    Elmore Guise, FNP

## 2020-06-15 DIAGNOSIS — F319 Bipolar disorder, unspecified: Secondary | ICD-10-CM | POA: Diagnosis not present

## 2020-06-15 DIAGNOSIS — F411 Generalized anxiety disorder: Secondary | ICD-10-CM | POA: Diagnosis not present

## 2020-07-12 ENCOUNTER — Other Ambulatory Visit: Payer: Self-pay | Admitting: Family Medicine

## 2020-07-13 NOTE — Telephone Encounter (Signed)
90 fioricet is excessive. I'd like to see her to discuss. I will send 30 for now.

## 2020-08-02 DIAGNOSIS — F319 Bipolar disorder, unspecified: Secondary | ICD-10-CM | POA: Diagnosis not present

## 2020-08-02 DIAGNOSIS — F411 Generalized anxiety disorder: Secondary | ICD-10-CM | POA: Diagnosis not present

## 2020-08-11 ENCOUNTER — Encounter: Payer: Self-pay | Admitting: Family Medicine

## 2020-08-11 ENCOUNTER — Other Ambulatory Visit: Payer: Self-pay | Admitting: Family Medicine

## 2020-08-16 ENCOUNTER — Other Ambulatory Visit: Payer: Self-pay

## 2020-08-16 ENCOUNTER — Ambulatory Visit (INDEPENDENT_AMBULATORY_CARE_PROVIDER_SITE_OTHER): Payer: Medicare Other | Admitting: Nurse Practitioner

## 2020-08-16 VITALS — BP 138/80 | HR 58 | Temp 97.6°F | Resp 18 | Wt 203.0 lb

## 2020-08-16 DIAGNOSIS — B3789 Other sites of candidiasis: Secondary | ICD-10-CM | POA: Diagnosis not present

## 2020-08-16 MED ORDER — NYSTATIN 100000 UNIT/GM EX POWD: 1.0000 "application " | Freq: Three times a day (TID) | CUTANEOUS | 0 refills | Status: AC

## 2020-08-16 MED ORDER — FLUCONAZOLE 150 MG PO TABS: 150.0000 mg | ORAL_TABLET | Freq: Once | ORAL | 0 refills | Status: AC

## 2020-08-16 NOTE — Progress Notes (Signed)
Established Patient Office Visit  Subjective:  Patient ID: Peggy Fitzgerald, female    DOB: 1956/03/18  Age: 64 y.o. MRN: [id number]  CC:  Chief Complaint  Patient presents with  . Vaginitis    non-vaginal-its location in the pelvic creaces, used ointment with no relief    HPI Peggy LiterCynthia I Fitzgerald is a 64 year old female presenting for rash to right groin region that started a week ago. The affected area is pruritic, erythremia, not edematous, no odor. No fever or chills no drainage. She has tried otc Athlete foot ointment on and off without resolution.   Past Medical History:  Diagnosis Date  . AC (acromioclavicular) joint bone spurs, right   . Allergy    seasonal  . Anxiety   . Asthma    asthmatic bronchitis  . Bipolar 1 disorder, depressed (HCC)   . Cataract 2007  . Depression   . GERD (gastroesophageal reflux disease)   . Herpes simplex type 2 infection   . High cholesterol   . Hyperlipidemia   . Hypertension   . Plantar fasciitis of right foot   . Prediabetes   . Rotator cuff tear    right shoulder    Past Surgical History:  Procedure Laterality Date  . CATARACT EXTRACTION, BILATERAL    . CHOLECYSTECTOMY  1987  . KNEE SURGERY Right     Family History  Problem Relation Age of Onset  . Arthritis Mother   . Asthma Mother   . Hearing loss Mother   . Hyperlipidemia Mother   . Hypertension Mother   . [place]   . Stroke Mother   . Hearing loss Father   . Hypertension Father   . Stroke Father   . Diabetes Father   . Bell's palsy Father   . Cancer Brother        leukemia  . Early death Brother   . Arthritis Maternal Grandmother   . Depression Maternal Grandmother   . Diabetes Maternal Grandmother   . Heart disease Maternal Grandmother        CHF  . Miscarriages / Stillbirths Maternal Grandmother   . Vision loss Maternal Grandmother   . Alcohol abuse Maternal Grandfather   . Heart attack Maternal Grandfather   . Arthritis  Paternal Grandmother   . Heart disease Paternal Grandmother        massive heart attack  . Miscarriages / Stillbirths Paternal Grandmother   . Alcohol abuse Paternal Grandfather   . Stroke Paternal Grandfather   . Heart disease Paternal Grandfather        arteriosclerosis    Social History   Socioeconomic History  . Marital status: Married    Spouse name: Not on file  . Number of children: Not on file  . Years of education: Not on file  . Highest education level: Not on file  Occupational History  . Not on file  Tobacco Use  . Smoking status: Former Smoker    Years: 0.50    Types: Cigarettes  . Smokeless tobacco: Never Used  Substance and Sexual Activity  . Alcohol use: No  . Drug use: No  . Sexual activity: Not Currently    Birth control/protection: Surgical    Comment: spouse vasectomy  Other Topics Concern  . Not on file  Social History Narrative  . Not on file   Social Determinants of Health   Financial Resource Strain:   . Difficulty of Paying Living Expenses: Not on file  Food Insecurity:   . Worried About Programme researcher, broadcasting/film/video in the Last Year: Not on file  . Ran Out of Food in the Last Year: Not on file  Transportation Needs:   . Lack of Transportation (Medical): Not on file  . Lack of Transportation (Non-Medical): Not on file  Physical Activity:   . Days of Exercise per Week: Not on file  . Minutes of Exercise per Session: Not on file  Stress:   . Feeling of Stress : Not on file  Social Connections:   . Frequency of Communication with Friends and Family: Not on file  . Frequency of Social Gatherings with Friends and Family: Not on file  . Attends Religious Services: Not on file  . Active Member of Clubs or Organizations: Not on file  . Attends Banker Meetings: Not on file  . Marital Status: Not on file  Intimate Partner Violence:   . Fear of Current or Ex-Partner: Not on file  . Emotionally Abused: Not on file  . Physically Abused: Not  on file  . Sexually Abused: Not on file    Outpatient Medications Prior to Visit  Medication Sig Dispense Refill  . acyclovir cream (ZOVIRAX) 5 % APPLY 1 APPLICATION TOPICALLY twice daily AS NEEDED for outbreaks 15 g 3  . albuterol (VENTOLIN HFA) 108 (90 Base) MCG/ACT inhaler Inhale 2 puffs into the lungs every 4 (four) hours as needed for wheezing or shortness of breath. 18 g 1  . atorvastatin (LIPITOR) 40 MG tablet Take 1 tablet (40 mg total) by mouth daily. 90 tablet 4  . Butalbital-APAP-Caffeine 50-300-40 MG CAPS TAKE 1 TO 2 CAPSULES EVERY 4 HOURS AS NEEDED. NOT MORE THAN 6 CAPSULES DAILY 30 capsule 0  . clotrimazole-betamethasone (LOTRISONE) cream Apply 1 application topically 2 (two) times daily. 45 g 3  . guaiFENesin-dextromethorphan (ROBITUSSIN DM) 100-10 MG/5ML syrup Take 5 mLs by mouth every 4 (four) hours as needed for cough. 118 mL 0  . lamoTRIgine (LAMICTAL) 25 MG tablet Take 1 tablet (25 mg total) by mouth daily. (Patient taking differently: Take by mouth. Patient taking 100mg  in morning and 50mg  at night) 14 tablet 0  . LATUDA 80 MG TABS tablet Take 80 mg by mouth daily.     LORazepam (ATIVAN) 0.5 MG tablet Take 0.5 mg by mouth 2 (two) times daily.    . meclizine (ANTIVERT) 25 MG tablet TAKE 1 TABLET AS NEEDED FOR DIZZINESS 90 tablet 3  . methocarbamol (ROBAXIN) 500 MG tablet Take 1 tablet (500 mg total) by mouth 4 (four) times daily. 90 tablet 1  . metoprolol succinate (TOPROL-XL) 100 MG 24 hr tablet TAKE 1 TABLET AT BEDTIME WITH OR IMMEDIATELY FOLLOWING A MEAL  (CALL CLINIC TO SCHEDULE APPOINTMENT FOR FUTURE REFILLS. [phone]) 90 tablet 3  . mometasone (NASONEX) 50 MCG/ACT nasal spray Place 2 sprays into the nose daily as needed (for allergies).     . montelukast (SINGULAIR) 10 MG tablet Take 1 tablet (10 mg total) by mouth at bedtime. 30 tablet 3  . promethazine (PHENERGAN) 25 MG tablet TAKE 1 TABLET EVERY 6 HOURS AS NEEDED FOR NAUSEA OR VOMITING 90 tablet 14  . zolpidem  (AMBIEN) 10 MG tablet Take 10 mg by mouth at bedtime as needed for sleep.    . Insulin Pen Needle (RELION PEN NEEDLES) 32G X 4 MM MISC Use daily with Saxenda 100 each 3  . Liraglutide -Weight Management (SAXENDA) 18 MG/3ML SOPN Inject 0.5 mLs (3 mg total)  into the skin daily. Inject 0.6 mg into the skin daily. Increase by 0.6mg  q week until max dose of 3 mg is reached. 5 pen 2   No facility-administered medications prior to visit.    Allergies  Allergen Reactions  . Milk-Related Compounds Diarrhea    Cannot drink plain milk, but can eat ice cream, and milk in cereal  . Oxycodone-Acetaminophen Itching    Nightmares    ROS Review of Systems  All other systems reviewed and are negative.     Objective:    Physical Exam Vitals and nursing note reviewed. Exam conducted with a chaperone present.  Constitutional:      Appearance: Normal appearance.  HENT:     Head: Atraumatic.  Eyes:     Extraocular Movements: Extraocular movements intact.     Conjunctiva/sclera: Conjunctivae normal.     Pupils: Pupils are equal, round, and reactive to light.  Cardiovascular:     Rate and Rhythm: Normal rate.  Pulmonary:     Effort: Pulmonary effort is normal.  Genitourinary:      Comments: consistent with erythremic candidal rash to right groin regoin Musculoskeletal:     Cervical back: Normal range of motion and neck supple.  Skin:    General: Skin is warm.     Findings: Erythema and rash present.  Neurological:     General: No focal deficit present.     Mental Status: [name] alert and oriented to person, place, and time.  Psychiatric:        Mood and Affect: Mood normal.        Judgment: Judgment normal.     BP 138/80 (BP Location: Left Arm, Patient Position: Sitting, Cuff Size: Large)   Pulse (!) 58   Temp 97.6 F (36.4 C) (Temporal)   Resp 18   Wt 203 lb (92.1 kg)   SpO2 96%   BMI 32.77 kg/m  Wt Readings from Last 3 Encounters:  08/16/20 203 lb (92.1 kg)  06/14/20 202 lb  (91.6 kg)  04/18/20 210 lb (95.3 kg)     Health Maintenance Due  Topic Date Due  . COVID-19 Vaccine (1) Never done  . HIV Screening  Never done  . TETANUS/TDAP  Never done  . PAP SMEAR-Modifier  Never done  . MAMMOGRAM  Never done  . COLONOSCOPY  Never done  . DEXA SCAN  Never done  . PNA vac Low Risk Adult (1 of 2 - PCV13) Never done  . INFLUENZA VACCINE  07/09/2020    There are no preventive care reminders to display for this patient.  No results found for: TSH Lab Results  Component Value Date   WBC 5.9 04/14/2020   HGB 12.3 04/14/2020   HCT 36.1 04/14/2020   MCV 92.8 04/14/2020   PLT 220 04/14/2020   Lab Results  Component Value Date   NA 139 06/14/2020   K 4.6 06/14/2020   CO2 24 06/14/2020   GLUCOSE 87 06/14/2020   BUN 18 06/14/2020   CREATININE 1.18 (H) 06/14/2020   BILITOT 0.3 04/14/2020   ALKPHOS 85 06/16/2017   AST 20 04/14/2020   ALT 12 04/14/2020   PROT 6.4 04/14/2020   ALBUMIN 4.2 06/16/2017   CALCIUM 8.9 06/14/2020   ANIONGAP 8 08/27/2016   Lab Results  Component Value Date   CHOL 187 04/14/2020   Lab Results  Component Value Date   HDL 53 04/14/2020   Lab Results  Component Value Date   LDLCALC 104 (H) 04/14/2020  Lab Results  Component Value Date   TRIG 188 (H) 04/14/2020   Lab Results  Component Value Date   CHOLHDL 3.5 04/14/2020   Lab Results  Component Value Date   HGBA1C 5.6 06/16/2017      Assessment & Plan:   Problem List Items Addressed This Visit    None    Visit Diagnoses    Candida rash of groin    -  Primary   Relevant Medications   fluconazole (DIFLUCAN) 150 MG tablet   nystatin (MYCOSTATIN/NYSTOP) powder      Meds ordered this encounter  Medications  . fluconazole (DIFLUCAN) 150 MG tablet    Sig: Take 1 tablet (150 mg total) by mouth once for 1 dose. May refill in 72 hours if needed    Dispense:  1 tablet    Refill:  0  . nystatin (MYCOSTATIN/NYSTOP) powder    Sig: Apply 1 application topically  3 (three) times daily for 10 days.    Dispense:  30 g    Refill:  0  keep affected area dry  Follow-up: Return if symptoms worsen or fail to improve.    Elmore Guise, FNP

## 2020-08-16 NOTE — Progress Notes (Deleted)
Established Patient Office Visit  Subjective:  Patient ID: Peggy Fitzgerald, female    DOB: Apr 29, 1956  Age: 64 y.o. MRN: [id number]  CC: No chief complaint on file.   HPI Peggy Fitzgerald presents for ***  Past Medical History:  Diagnosis Date  . AC (acromioclavicular) joint bone spurs, right   . Allergy    seasonal  . Anxiety   . Asthma    asthmatic bronchitis  . Bipolar 1 disorder, depressed (HCC)   . Cataract 2007  . Depression   . GERD (gastroesophageal reflux disease)   . Herpes simplex type 2 infection   . High cholesterol   . Hyperlipidemia   . Hypertension   . Plantar fasciitis of right foot   . Prediabetes   . Rotator cuff tear    right shoulder    Past Surgical History:  Procedure Laterality Date  . CATARACT EXTRACTION, BILATERAL    . CHOLECYSTECTOMY  1987  . KNEE SURGERY Right     Family History  Problem Relation Age of Onset  . Arthritis Mother   . Asthma Mother   . Hearing loss Mother   . Hyperlipidemia Mother   . Hypertension Mother   . Miscarriages / [place] Mother   . Stroke Mother   . Hearing loss Father   . Hypertension Father   . Stroke Father   . Diabetes Father   . Bell's palsy Father   . Cancer Brother        leukemia  . Early death Brother   . Arthritis Maternal Grandmother   . Depression Maternal Grandmother   . Diabetes Maternal Grandmother   . Heart disease Maternal Grandmother        CHF  . Miscarriages / Stillbirths Maternal Grandmother   . Vision loss Maternal Grandmother   . Alcohol abuse Maternal Grandfather   . Heart attack Maternal Grandfather   . Arthritis Paternal Grandmother   . Heart disease Paternal Grandmother        massive heart attack  . Miscarriages / Stillbirths Paternal Grandmother   . Alcohol abuse Paternal Grandfather   . Stroke Paternal Grandfather   . Heart disease Paternal Grandfather        arteriosclerosis    Social History   Socioeconomic History  . Marital status: Married      Spouse name: Not on file  . Number of children: Not on file  . Years of education: Not on file  . Highest education level: Not on file  Occupational History  . Not on file  Tobacco Use  . Smoking status: Former Smoker    Years: 0.50    Types: Cigarettes  . Smokeless tobacco: Never Used  Substance and Sexual Activity  . Alcohol use: No  . Drug use: No  . Sexual activity: Not Currently    Birth control/protection: Surgical    Comment: spouse vasectomy  Other Topics Concern  . Not on file  Social History Narrative  . Not on file   Social Determinants of Health   Financial Resource Strain:   . Difficulty of Paying Living Expenses: Not on file  Food Insecurity:   . Worried About Programme researcher, broadcasting/film/video in the Last Year: Not on file  . Ran Out of Food in the Last Year: Not on file  Transportation Needs:   . Lack of Transportation (Medical): Not on file  . Lack of Transportation (Non-Medical): Not on file  Physical Activity:   . Days of Exercise per  Week: Not on file  . Minutes of Exercise per Session: Not on file  Stress:   . Feeling of Stress : Not on file  Social Connections:   . Frequency of Communication with Friends and Family: Not on file  . Frequency of Social Gatherings with Friends and Family: Not on file  . Attends Religious Services: Not on file  . Active Member of Clubs or Organizations: Not on file  . Attends Banker Meetings: Not on file  . Marital Status: Not on file  Intimate Partner Violence:   . Fear of Current or Ex-Partner: Not on file  . Emotionally Abused: Not on file  . Physically Abused: Not on file  . Sexually Abused: Not on file    Outpatient Medications Prior to Visit  Medication Sig Dispense Refill  . acyclovir cream (ZOVIRAX) 5 % APPLY 1 APPLICATION TOPICALLY twice daily AS NEEDED for outbreaks 15 g 3  . albuterol (VENTOLIN HFA) 108 (90 Base) MCG/ACT inhaler Inhale 2 puffs into the lungs every 4 (four) hours as needed for  wheezing or shortness of breath. 18 g 1  . atorvastatin (LIPITOR) 40 MG tablet Take 1 tablet (40 mg total) by mouth daily. 90 tablet 4  . Butalbital-APAP-Caffeine 50-300-40 MG CAPS TAKE 1 TO 2 CAPSULES EVERY 4 HOURS AS NEEDED. NOT MORE THAN 6 CAPSULES DAILY 30 capsule 0  . clotrimazole-betamethasone (LOTRISONE) cream Apply 1 application topically 2 (two) times daily. 45 g 3  . guaiFENesin-dextromethorphan (ROBITUSSIN DM) 100-10 MG/5ML syrup Take 5 mLs by mouth every 4 (four) hours as needed for cough. 118 mL 0  . Insulin Pen Needle (RELION PEN NEEDLES) 32G X 4 MM MISC Use daily with Saxenda 100 each 3  . lamoTRIgine (LAMICTAL) 25 MG tablet Take 1 tablet (25 mg total) by mouth daily. (Patient taking differently: Take by mouth. Patient taking 100mg  in morning and 50mg  at night) 14 tablet 0  . LATUDA 80 MG TABS tablet Take 80 mg by mouth daily.     . Liraglutide -Weight Management (SAXENDA) 18 MG/3ML SOPN Inject 0.5 mLs (3 mg total) into the skin daily. Inject 0.6 mg into the skin daily. Increase by 0.6mg  q week until max dose of 3 mg is reached. 5 pen 2  . LORazepam (ATIVAN) 0.5 MG tablet Take 0.5 mg by mouth 2 (two) times daily.    . meclizine (ANTIVERT) 25 MG tablet TAKE 1 TABLET AS NEEDED FOR DIZZINESS 90 tablet 3  . methocarbamol (ROBAXIN) 500 MG tablet Take 1 tablet (500 mg total) by mouth 4 (four) times daily. 90 tablet 1  . metoprolol succinate (TOPROL-XL) 100 MG 24 hr tablet TAKE 1 TABLET AT BEDTIME WITH OR IMMEDIATELY FOLLOWING A MEAL  (CALL CLINIC TO SCHEDULE APPOINTMENT FOR FUTURE REFILLS. [phone]) 90 tablet 3  . mometasone (NASONEX) 50 MCG/ACT nasal spray Place 2 sprays into the nose daily as needed (for allergies).     . montelukast (SINGULAIR) 10 MG tablet Take 1 tablet (10 mg total) by mouth at bedtime. 30 tablet 3  . promethazine (PHENERGAN) 25 MG tablet TAKE 1 TABLET EVERY 6 HOURS AS NEEDED FOR NAUSEA OR VOMITING 90 tablet 14  . zolpidem (AMBIEN) 10 MG tablet Take 10 mg by mouth  at bedtime as needed for sleep.     No facility-administered medications prior to visit.    Allergies  Allergen Reactions  . Milk-Related Compounds Diarrhea    Cannot drink plain milk, but can eat ice cream, and milk in cereal  .  Oxycodone-Acetaminophen Itching    Nightmares    ROS Review of Systems    Objective:    Physical Exam  There were no vitals taken for this visit. Wt Readings from Last 3 Encounters:  06/14/20 202 lb (91.6 kg)  04/18/20 210 lb (95.3 kg)  03/17/20 208 lb (94.3 kg)     Health Maintenance Due  Topic Date Due  . COVID-19 Vaccine (1) Never done  . HIV Screening  Never done  . TETANUS/TDAP  Never done  . PAP SMEAR-Modifier  Never done  . MAMMOGRAM  Never done  . COLONOSCOPY  Never done  . DEXA SCAN  Never done  . PNA vac Low Risk Adult (1 of 2 - PCV13) Never done  . INFLUENZA VACCINE  07/09/2020    There are no preventive care reminders to display for this patient.  No results found for: TSH Lab Results  Component Value Date   WBC 5.9 04/14/2020   HGB 12.3 04/14/2020   HCT 36.1 04/14/2020   MCV 92.8 04/14/2020   PLT 220 04/14/2020   Lab Results  Component Value Date   NA 139 06/14/2020   K 4.6 06/14/2020   CO2 24 06/14/2020   GLUCOSE 87 06/14/2020   BUN 18 06/14/2020   CREATININE 1.18 (H) 06/14/2020   BILITOT 0.3 04/14/2020   ALKPHOS 85 06/16/2017   AST 20 04/14/2020   ALT 12 04/14/2020   PROT 6.4 04/14/2020   ALBUMIN 4.2 06/16/2017   CALCIUM 8.9 06/14/2020   ANIONGAP 8 08/27/2016   Lab Results  Component Value Date   CHOL 187 04/14/2020   Lab Results  Component Value Date   HDL 53 04/14/2020   Lab Results  Component Value Date   LDLCALC 104 (H) 04/14/2020   Lab Results  Component Value Date   TRIG 188 (H) 04/14/2020   Lab Results  Component Value Date   CHOLHDL 3.5 04/14/2020   Lab Results  Component Value Date   HGBA1C 5.6 06/16/2017      Assessment & Plan:   Problem List Items Addressed This Visit     None      No orders of the defined types were placed in this encounter.   Follow-up: No follow-ups on file.    Elmore Guise, FNP

## 2020-09-01 ENCOUNTER — Encounter: Payer: Self-pay | Admitting: Family Medicine

## 2020-09-04 MED ORDER — BUTALBITAL-APAP-CAFFEINE 50-300-40 MG PO CAPS: 1.0000 | ORAL_CAPSULE | ORAL | 0 refills | Status: DC | PRN

## 2020-09-04 NOTE — Telephone Encounter (Signed)
Ok to refill to mail order??  Last office visit 08/16/2020.  Last refill 07/13/2020.

## 2020-09-06 ENCOUNTER — Ambulatory Visit (INDEPENDENT_AMBULATORY_CARE_PROVIDER_SITE_OTHER): Payer: Medicare Other | Admitting: Family Medicine

## 2020-09-06 ENCOUNTER — Other Ambulatory Visit: Payer: Self-pay

## 2020-09-06 ENCOUNTER — Encounter: Payer: Self-pay | Admitting: Family Medicine

## 2020-09-06 VITALS — BP 118/64 | HR 72 | Temp 97.8°F | Resp 16 | Ht 66.0 in | Wt 208.0 lb

## 2020-09-06 DIAGNOSIS — L509 Urticaria, unspecified: Secondary | ICD-10-CM

## 2020-09-06 MED ORDER — PREDNISONE 10 MG PO TABS
ORAL_TABLET | ORAL | 0 refills | Status: DC
Start: 2020-09-06 — End: 2020-09-21

## 2020-09-06 MED ORDER — METHYLPREDNISOLONE ACETATE 80 MG/ML IJ SUSP
80.0000 mg | Freq: Once | INTRAMUSCULAR | Status: AC
  Administered 2020-09-06: 80 mg via INTRAMUSCULAR

## 2020-09-06 MED ORDER — HYDROXYZINE HCL 25 MG PO TABS
25.0000 mg | ORAL_TABLET | Freq: Three times a day (TID) | ORAL | Status: AC | PRN
Start: 2020-09-06 — End: ?

## 2020-09-06 NOTE — Progress Notes (Signed)
   Subjective:    Patient ID: Peggy Fitzgerald, female    DOB: Nov 04, 1956, 64 y.o.   MRN: [id number]  Patient presents for Rash (x1 day- hives- no new exposures, but did have increase in stress)     Pt here with hives for the past 24 hours  , no diffiulyt breathing    she called after hours line this AM at 4:30am, as they were worsening   Hives started after she heard about bad new from her husband./ she has history of itching with anxiety in general had atarax she takes 50mg  at bedtime  [facility]  Yesterday around 1:30pm with husband started with itching and then hives broke out She tried benadryl and cortisone 10 but they would not resolve  has on her back, abd, legs, arms, now has some coming up her neck  no difficulty breathing, no facial or tongue swelling  no difficulty swallowing   No new meds recently otherwise  no recent illness           Review Of Systems:  GEN- denies fatigue, fever, weight loss,weakness, recent illness HEENT- denies eye drainage, change in vision, nasal discharge, CVS- denies chest pain, palpitations RESP- denies SOB, cough, wheeze ABD- denies N/V, change in stools, abd pain MSK- denies joint pain, muscle aches, injury Neuro- denies headache, dizziness, syncope, seizure activity       Objective:    BP 118/64   Pulse 72   Temp 97.8 F (36.6 C) (Temporal)   Resp 16   Ht 5\' 6"  (1.676 m)   Wt 208 lb (94.3 kg)   SpO2 99%   BMI 33.57 kg/m  GEN- NAD, alert and oriented x3 HEENT- PERRL, EOMI, non injected sclera, pink conjunctiva, MMM, oropharynx clear, uvula midline Neck- Supple, LAD CVS- RRR, no murmur RESP-CTAB ABD-NABS,soft,NT,ND Skin- Generalized erythematous maculpapular lesion on back, chest, arms, abd, legs, few hives on neck, all blanching lesions  EXT- No edema Pulses- Radial2+        Assessment & Plan:      Problem List Items Addressed This Visit    None    Visit Diagnoses    Hives    -  Primary    Given Depo Medrol 80mg  IM in office, prednisone taper, increase atarax to BID, discussed red flags, oatmeal bath   Relevant Medications   methylPREDNISolone acetate (DEPO-MEDROL) injection 80 mg (Completed)      Note: This dictation was prepared with Dragon dictation along with smaller phrase technology. Any transcriptional errors that result from this process are unintentional.

## 2020-09-06 NOTE — Patient Instructions (Signed)
Prednisone shot given  Start prednisone taper in the morning Use the hydroxyzine twice a day  Call if not improved

## 2020-09-07 ENCOUNTER — Encounter: Payer: Self-pay | Admitting: Family Medicine

## 2020-09-11 DIAGNOSIS — F411 Generalized anxiety disorder: Secondary | ICD-10-CM | POA: Diagnosis not present

## 2020-09-11 DIAGNOSIS — F319 Bipolar disorder, unspecified: Secondary | ICD-10-CM | POA: Diagnosis not present

## 2020-09-21 ENCOUNTER — Ambulatory Visit (INDEPENDENT_AMBULATORY_CARE_PROVIDER_SITE_OTHER): Payer: Medicare Other | Admitting: Family Medicine

## 2020-09-21 ENCOUNTER — Other Ambulatory Visit: Payer: Self-pay

## 2020-09-21 VITALS — BP 120/90 | HR 68 | Temp 98.0°F | Ht 66.0 in | Wt 193.0 lb

## 2020-09-21 DIAGNOSIS — R233 Spontaneous ecchymoses: Secondary | ICD-10-CM

## 2020-09-21 MED ORDER — LORAZEPAM 0.5 MG PO TABS: 0.5000 mg | ORAL_TABLET | Freq: Three times a day (TID) | ORAL | 0 refills | Status: DC | PRN

## 2020-09-21 NOTE — Progress Notes (Signed)
Subjective:    Patient ID: Peggy Fitzgerald, female    DOB: Apr 29, 1956, 64 y.o.   MRN: [id number]   Patient reports itching all over her body.  Today she continuously scratches her arms and her shoulders while talking to me.  She states that her urticaria is back.  She states that it is worse on her arms and on her back.  Inspection of her back today reveals numerous scattered petechiae and purpura from where she has been scratching vigorously however there is no visible rash other than this.  There is certainly no visible urticaria or papules.  On her arms are also clusters of bruising and petechia and purpura from scratching.  She lives with her husband.  He has not been itching.  There are no other contacts have been itching.  She denies any exposure to scabies.  However she has been under a lot of stress recently.  Her husband is about to have a toe amputated.  [name] very worried about this.  She denies any fever or chills.  She does have a family history of leukemia.  She has been taking hydroxyzine with no relief. Past Medical History:  Diagnosis Date  . AC (acromioclavicular) joint bone spurs, right   . Allergy    seasonal  . Anxiety   . Asthma    asthmatic bronchitis  . Bipolar 1 disorder, depressed (HCC)   . Cataract 2007  . Depression   . GERD (gastroesophageal reflux disease)   . Herpes simplex type 2 infection   . High cholesterol   . Hyperlipidemia   . Hypertension   . Plantar fasciitis of right foot   . Prediabetes   . Rotator cuff tear    right shoulder   Past Surgical History:  Procedure Laterality Date  . CATARACT EXTRACTION, BILATERAL    . CHOLECYSTECTOMY  1987  . KNEE SURGERY Right    Current Outpatient Medications on File Prior to Visit  Medication Sig Dispense Refill  . acyclovir cream (ZOVIRAX) 5 % APPLY 1 APPLICATION TOPICALLY twice daily AS NEEDED for outbreaks 15 g 3  . albuterol (VENTOLIN HFA) 108 (90 Base) MCG/ACT inhaler Inhale 2 puffs into the  lungs every 4 (four) hours as needed for wheezing or shortness of breath. 18 g 1  . atorvastatin (LIPITOR) 40 MG tablet Take 1 tablet (40 mg total) by mouth daily. 90 tablet 4  . Butalbital-APAP-Caffeine 50-300-40 MG CAPS Take 1-2 capsules by mouth every 4 (four) hours as needed (do not exceed 6 capsules per day). 30 capsule 0  . clotrimazole-betamethasone (LOTRISONE) cream Apply 1 application topically 2 (two) times daily. 45 g 3  . guaiFENesin-dextromethorphan (ROBITUSSIN DM) 100-10 MG/5ML syrup Take 5 mLs by mouth every 4 (four) hours as needed for cough. 118 mL 0  . hydrOXYzine (ATARAX/VISTARIL) 25 MG tablet Take 1 tablet (25 mg total) by mouth 3 (three) times daily as needed.    . lamoTRIgine (LAMICTAL) 25 MG tablet Take 1 tablet (25 mg total) by mouth daily. (Patient taking differently: Take by mouth. Patient taking 100mg  in morning and 50mg  at night) 14 tablet 0  . LATUDA 80 MG TABS tablet Take 80 mg by mouth daily.     . meclizine (ANTIVERT) 25 MG tablet TAKE 1 TABLET AS NEEDED FOR DIZZINESS 90 tablet 3  . methocarbamol (ROBAXIN) 500 MG tablet Take 1 tablet (500 mg total) by mouth 4 (four) times daily. 90 tablet 1  . metoprolol succinate (TOPROL-XL) 100 MG 24  hr tablet TAKE 1 TABLET AT BEDTIME WITH OR IMMEDIATELY FOLLOWING A MEAL  (CALL CLINIC TO SCHEDULE APPOINTMENT FOR FUTURE REFILLS. [phone]) 90 tablet 3  . mometasone (NASONEX) 50 MCG/ACT nasal spray Place 2 sprays into the nose daily as needed (for allergies).     . montelukast (SINGULAIR) 10 MG tablet Take 1 tablet (10 mg total) by mouth at bedtime. 30 tablet 3  . promethazine (PHENERGAN) 25 MG tablet TAKE 1 TABLET EVERY 6 HOURS AS NEEDED FOR NAUSEA OR VOMITING 90 tablet 14  . zolpidem (AMBIEN) 10 MG tablet Take 10 mg by mouth at bedtime as needed for sleep.     No current facility-administered medications on file prior to visit.   Allergies  Allergen Reactions  . Milk-Related Compounds Diarrhea    Cannot drink plain milk, but  can eat ice cream, and milk in cereal  . Oxycodone-Acetaminophen Itching    Nightmares   Social History   Socioeconomic History  . Marital status: Married    Spouse name: Not on file  . Number of children: Not on file  . Years of education: Not on file  . Highest education level: Not on file  Occupational History  . Not on file  Tobacco Use  . Smoking status: Former Smoker    Years: 0.50    Types: Cigarettes  . Smokeless tobacco: Never Used  Substance and Sexual Activity  . Alcohol use: No  . Drug use: No  . Sexual activity: Not Currently    Birth control/protection: Surgical    Comment: spouse vasectomy  Other Topics Concern  . Not on file  Social History Narrative  . Not on file   Social Determinants of Health   Financial Resource Strain:   . Difficulty of Paying Living Expenses: Not on file  Food Insecurity:   . Worried About Programme researcher, broadcasting/film/video in the Last Year: Not on file  . Ran Out of Food in the Last Year: Not on file  Transportation Needs:   . Lack of Transportation (Medical): Not on file  . Lack of Transportation (Non-Medical): Not on file  Physical Activity:   . Days of Exercise per Week: Not on file  . Minutes of Exercise per Session: Not on file  Stress:   . Feeling of Stress : Not on file  Social Connections:   . Frequency of Communication with Friends and Family: Not on file  . Frequency of Social Gatherings with Friends and Family: Not on file  . Attends Religious Services: Not on file  . Active Member of Clubs or Organizations: Not on file  . Attends Banker Meetings: Not on file  . Marital Status: Not on file  Intimate Partner Violence:   . Fear of Current or Ex-Partner: Not on file  . Emotionally Abused: Not on file  . Physically Abused: Not on file  . Sexually Abused: Not on file      Review of Systems  All other systems reviewed and are negative.      Objective:   Physical Exam  Constitutional: [name] oriented to  person, place, and time.  Cardiovascular: Normal rate, regular rhythm and normal heart sounds.   Pulmonary/Chest: Effort normal and breath sounds normal. No respiratory distress. She has no wheezes. She has no rales.  Abdominal: Soft. Bowel sounds are normal.  Neurological: [name] alert and oriented to person, place, and time. She has normal reflexes. No cranial nerve deficit. She exhibits normal muscle tone. Coordination normal.  Vitals reviewed.  Petechia and purpura on her back arms and abdomen from scratching     Assessment & Plan:  Petechiae - Plan: CBC with Differential/Platelet, COMPLETE METABOLIC PANEL WITH GFR Given the petechia and purpura I will check a CBC to evaluate for any thrombocytopenia or leukocytosis that may suggest an underlying bone marrow abnormality.  I will check hemoglobin to rule out polycythemia vera.  I will check a CMP to rule out any liver or kidney dysfunction.  However I believe the petechia and purpura are due to just vigorous scratching.  Her skin today shows no other rash.  Therefore I believe the itching is most likely due to anxiety and "nerves".  I discussed this with the patient and she states it certainly possible.  Given the fact that hydroxyzine does not help, we will temporarily try Ativan 0.5 mg every 8 hours as needed for itching and see if this helps.  If so, we may need to focus on better management of her stress and anxiety rather than combining multiple antihistamines.

## 2020-09-22 LAB — COMPLETE METABOLIC PANEL WITH GFR
AG Ratio: 1.5 (calc) (ref 1.0–2.5)
ALT: 15 U/L (ref 6–29)
AST: 19 U/L (ref 10–35)
Albumin: 4.3 g/dL (ref 3.6–5.1)
Alkaline phosphatase (APISO): 76 U/L (ref 37–153)
BUN/Creatinine Ratio: 11 (calc) (ref 6–22)
BUN: 12 mg/dL (ref 7–25)
CO2: 27 mmol/L (ref 20–32)
Calcium: 9.7 mg/dL (ref 8.6–10.4)
Chloride: 104 mmol/L (ref 98–110)
Creat: 1.13 mg/dL — ABNORMAL HIGH (ref 0.50–0.99)
GFR, Est African American: 59 mL/min/{1.73_m2} — ABNORMAL LOW (ref >=60)
GFR, Est Non African American: 51 mL/min/{1.73_m2} — ABNORMAL LOW (ref >=60)
Globulin: 2.9 g/dL (calc) (ref 1.9–3.7)
Glucose, Bld: 111 mg/dL — ABNORMAL HIGH (ref 65–99)
Potassium: 4.8 mmol/L (ref 3.5–5.3)
Sodium: 140 mmol/L (ref 135–146)
Total Bilirubin: 0.5 mg/dL (ref 0.2–1.2)
Total Protein: 7.2 g/dL (ref 6.1–8.1)

## 2020-09-22 LAB — CBC WITH DIFFERENTIAL/PLATELET
Absolute Monocytes: 837 cells/uL (ref 200–950)
Basophils Absolute: 56 cells/uL (ref 0–200)
Basophils Relative: 0.6 %
Eosinophils Absolute: 391 cells/uL (ref 15–500)
Eosinophils Relative: 4.2 %
HCT: 42.7 % (ref 35.0–45.0)
Hemoglobin: 14.4 g/dL (ref 11.7–15.5)
Lymphs Abs: 1618 cells/uL (ref 850–3900)
MCH: 31.8 pg (ref 27.0–33.0)
MCHC: 33.7 g/dL (ref 32.0–36.0)
MCV: 94.3 fL (ref 80.0–100.0)
MPV: 9.5 fL (ref 7.5–12.5)
Monocytes Relative: 9 %
Neutro Abs: 6398 cells/uL (ref 1500–7800)
Neutrophils Relative %: 68.8 %
Platelets: 264 10*3/uL (ref 140–400)
RBC: 4.53 10*6/uL (ref 3.80–5.10)
RDW: 15.2 % — ABNORMAL HIGH (ref 11.0–15.0)
Total Lymphocyte: 17.4 %
WBC: 9.3 10*3/uL (ref 3.8–10.8)

## 2020-10-02 ENCOUNTER — Encounter: Payer: Self-pay | Admitting: Family Medicine

## 2020-10-03 ENCOUNTER — Other Ambulatory Visit: Payer: Self-pay | Admitting: Family Medicine

## 2020-10-03 MED ORDER — FLUCONAZOLE 150 MG PO TABS
150.0000 mg | ORAL_TABLET | Freq: Once | ORAL | 0 refills | Status: AC
Start: 2020-10-03 — End: 2020-10-03

## 2020-10-10 ENCOUNTER — Encounter: Payer: Self-pay | Admitting: Family Medicine

## 2020-10-10 ENCOUNTER — Other Ambulatory Visit: Payer: Self-pay | Admitting: Family Medicine

## 2020-10-10 NOTE — Telephone Encounter (Signed)
Ok to refill 

## 2020-10-11 ENCOUNTER — Other Ambulatory Visit: Payer: Self-pay | Admitting: Family Medicine

## 2020-10-11 NOTE — Telephone Encounter (Signed)
Sent pt message thru MyChart

## 2020-10-12 ENCOUNTER — Other Ambulatory Visit: Payer: Self-pay

## 2020-10-12 DIAGNOSIS — L509 Urticaria, unspecified: Secondary | ICD-10-CM

## 2020-10-12 DIAGNOSIS — L299 Pruritus, unspecified: Secondary | ICD-10-CM

## 2020-10-12 MED ORDER — CLOBETASOL PROPIONATE 0.05 % EX LOTN: 1.0000 "application " | TOPICAL_LOTION | Freq: Two times a day (BID) | CUTANEOUS | 0 refills | Status: DC

## 2020-10-12 MED ORDER — CLOBETASOL PROPIONATE 0.05 % EX SOLN: 1.0000 "application " | Freq: Two times a day (BID) | CUTANEOUS | 0 refills | Status: DC

## 2020-10-12 NOTE — Progress Notes (Signed)
clo

## 2020-10-12 NOTE — Telephone Encounter (Signed)
Medication sent.

## 2020-11-08 ENCOUNTER — Other Ambulatory Visit: Payer: Self-pay | Admitting: Family Medicine

## 2020-11-08 DIAGNOSIS — R42 Dizziness and giddiness: Secondary | ICD-10-CM

## 2020-11-08 NOTE — Telephone Encounter (Signed)
Ok to refill??  Last office visit 09/21/2020.  Last refill 10/10/2020.

## 2020-11-15 DIAGNOSIS — F411 Generalized anxiety disorder: Secondary | ICD-10-CM | POA: Diagnosis not present

## 2020-11-15 DIAGNOSIS — F319 Bipolar disorder, unspecified: Secondary | ICD-10-CM | POA: Diagnosis not present

## 2020-11-19 ENCOUNTER — Other Ambulatory Visit: Payer: Self-pay | Admitting: Family Medicine

## 2020-11-20 ENCOUNTER — Other Ambulatory Visit: Payer: Self-pay | Admitting: Family Medicine

## 2020-11-20 NOTE — Telephone Encounter (Signed)
Ok to refill 

## 2020-12-15 ENCOUNTER — Ambulatory Visit (INDEPENDENT_AMBULATORY_CARE_PROVIDER_SITE_OTHER): Payer: Medicare Other | Admitting: Family Medicine

## 2020-12-15 ENCOUNTER — Other Ambulatory Visit: Payer: Self-pay

## 2020-12-15 ENCOUNTER — Encounter: Payer: Self-pay | Admitting: Family Medicine

## 2020-12-15 VITALS — BP 122/84 | HR 70 | Temp 98.3°F | Resp 14 | Ht 66.0 in | Wt 201.0 lb

## 2020-12-15 DIAGNOSIS — N760 Acute vaginitis: Secondary | ICD-10-CM | POA: Diagnosis not present

## 2020-12-15 DIAGNOSIS — N952 Postmenopausal atrophic vaginitis: Secondary | ICD-10-CM | POA: Diagnosis not present

## 2020-12-15 DIAGNOSIS — Z1231 Encounter for screening mammogram for malignant neoplasm of breast: Secondary | ICD-10-CM | POA: Diagnosis not present

## 2020-12-15 DIAGNOSIS — Z124 Encounter for screening for malignant neoplasm of cervix: Secondary | ICD-10-CM

## 2020-12-15 DIAGNOSIS — N898 Other specified noninflammatory disorders of vagina: Secondary | ICD-10-CM | POA: Diagnosis not present

## 2020-12-15 DIAGNOSIS — J209 Acute bronchitis, unspecified: Secondary | ICD-10-CM

## 2020-12-15 DIAGNOSIS — B9689 Other specified bacterial agents as the cause of diseases classified elsewhere: Secondary | ICD-10-CM

## 2020-12-15 LAB — WET PREP FOR TRICH, YEAST, CLUE

## 2020-12-15 MED ORDER — ALBUTEROL SULFATE HFA 108 (90 BASE) MCG/ACT IN AERS: 2.0000 | INHALATION_SPRAY | RESPIRATORY_TRACT | 1 refills | Status: DC | PRN

## 2020-12-15 MED ORDER — METRONIDAZOLE 500 MG PO TABS: 500.0000 mg | ORAL_TABLET | Freq: Two times a day (BID) | ORAL | 0 refills | Status: DC

## 2020-12-15 MED ORDER — HYDROCODONE-HOMATROPINE 5-1.5 MG/5ML PO SYRP: 5.0000 mL | ORAL_SOLUTION | Freq: Three times a day (TID) | ORAL | 0 refills | Status: DC | PRN

## 2020-12-15 MED ORDER — MONTELUKAST SODIUM 10 MG PO TABS: 10.0000 mg | ORAL_TABLET | Freq: Every day | ORAL | 3 refills | Status: DC

## 2020-12-15 MED ORDER — MOMETASONE FUROATE 50 MCG/ACT NA SUSP: 2.0000 | Freq: Every day | NASAL | 2 refills | Status: DC | PRN

## 2020-12-15 MED ORDER — FLUCONAZOLE 150 MG PO TABS
150.0000 mg | ORAL_TABLET | Freq: Once | ORAL | 0 refills | Status: AC
Start: 2020-12-15 — End: 2020-12-15

## 2020-12-15 NOTE — Patient Instructions (Addendum)
Schedule Mammogram Use Replens over the counter  Medications refilled

## 2020-12-15 NOTE — Progress Notes (Signed)
Subjective:    Patient ID: Peggy Fitzgerald, female    DOB: 1956/07/23, 65 y.o.   MRN: [id number]  Patient presents for Vaginal Dryness (Labia stays dry d/t loss of hormone- using warming gel lubrication) and Cough (Nonproductive cough- would like refill on cough syrup)   She has history of vaginal dryness, but feels irritated like sandpaper feeling She uses monistat topically for itching at times. In the past she was on topical estrogen from GYN  States that she has been trying to be more sexually active with her husband but has a lot of discomfort.  She has also had herpes outbreaks but she is on Valtrex suppression.  She does get some vaginal discharge [name] not had a Pap smear in the past 5 years she has not had a mammogram  Cough since Christmas, she has history of asthma states that her family members were sick including the grandchildren.  She has had COVID back in the summer but states that she does not think she had it at that time and does not think that it is real.  she had hycodan, from a year ago, she would like to refill  She has albuterol in chart, but does not have at home  She doesn't have sinuglair or nasonex      Review Of Systems:  GEN- denies fatigue, fever, weight loss,weakness, recent illness HEENT- denies eye drainage, change in vision, nasal discharge, CVS- denies chest pain, palpitations RESP- denies SOB, +cough, wheeze ABD- denies N/V, change in stools, abd pain GU- denies dysuria, hematuria, dribbling, incontinence MSK- denies joint pain, muscle aches, injury Neuro- denies headache, dizziness, syncope, seizure activity       Objective:    BP 122/84   Pulse 70   Temp 98.3 F (36.8 C) (Temporal)   Resp 14   Ht 5\' 6"  (1.676 m)   Wt 201 lb (91.2 kg)   SpO2 98%   BMI 32.44 kg/m  GEN- NAD, alert and oriented x3 HEENT- PERRL, EOMI, non injected sclera, pink conjunctiva, MMM, oropharynx clear Neck- Supple, no LAD  CVS- RRR, no murmur RESP-mild  rhonchi, clears with cough, no wheeze, normal WOB ABD-NABS,soft,NT,ND GU- normal external genitalia, vaginal atrophy, atrophy of labia minora cervix visualized no growth, no blood form os+ discharge, no CMT, no ovarian masses, uterus normal size EXT- No edema Pulses- Radial, DP- 2+        Assessment & Plan:      Problem List Items Addressed This Visit   None   Visit Diagnoses    Atrophic vaginitis    -  Primary   Patient with known atrophic vaginitis but also has bacterial vaginosis today.  No sign of herpes outbreak.  Pap smear obtained   Acute bronchitis, unspecified organism       Patient with bronchitis symptoms concerned that she may have had the new omicron variant of COVID however she does not feel that the viral pandemic is significant.  At this point she has had symptoms for the past couple weeks therefore I would not test her today as it would not change management.  I refilled her cough medicine as well as her allergy and asthma meds.   Encounter for screening mammogram for malignant neoplasm of breast       Recommend patient get mammogram for restarting hormonal therapy postmenopausally. Based on results of testing she can consider going back on topical hormone times a week versus oral   Relevant Orders   MM  3D SCREEN BREAST BILATERAL   Cervical cancer screening       Relevant Orders   Pap IG w/ reflex to HPV when ASC-U   BV (bacterial vaginosis)       Relevant Medications   metroNIDAZOLE (FLAGYL) 500 MG tablet   fluconazole (DIFLUCAN) 150 MG tablet   Other Relevant Orders   WET PREP FOR TRICH, YEAST, CLUE (Completed)      Note: This dictation was prepared with Dragon dictation along with smaller phrase technology. Any transcriptional errors that result from this process are unintentional.

## 2020-12-18 LAB — PAP IG W/ RFLX HPV ASCU

## 2020-12-19 ENCOUNTER — Encounter: Payer: Self-pay | Admitting: *Deleted

## 2020-12-22 ENCOUNTER — Telehealth: Payer: Self-pay | Admitting: *Deleted

## 2020-12-22 MED ORDER — FLUTICASONE PROPIONATE 50 MCG/ACT NA SUSP
2.0000 | Freq: Every day | NASAL | 6 refills | Status: DC
Start: 2020-12-22 — End: 2023-03-20

## 2020-12-22 NOTE — Telephone Encounter (Signed)
Received call from patient.   Reports that insurance will not cover Nasonex. States that alternative noted as Flonase.   Prescription sent to pharmacy.

## 2021-01-03 ENCOUNTER — Other Ambulatory Visit: Payer: Self-pay | Admitting: Family Medicine

## 2021-01-17 DIAGNOSIS — F411 Generalized anxiety disorder: Secondary | ICD-10-CM | POA: Diagnosis not present

## 2021-01-17 DIAGNOSIS — F319 Bipolar disorder, unspecified: Secondary | ICD-10-CM | POA: Diagnosis not present

## 2021-01-22 ENCOUNTER — Telehealth: Payer: Self-pay | Admitting: Family Medicine

## 2021-01-22 NOTE — Telephone Encounter (Signed)
Refill Diflucan also can she get refill ? Powder mediation for vaginal area

## 2021-01-28 ENCOUNTER — Encounter: Payer: Self-pay | Admitting: Family Medicine

## 2021-01-29 MED ORDER — FLUCONAZOLE 150 MG PO TABS
150.0000 mg | ORAL_TABLET | Freq: Once | ORAL | 0 refills | Status: AC
Start: 2021-01-29 — End: 2021-01-29

## 2021-01-29 MED ORDER — NYSTATIN 100000 UNIT/GM EX POWD
1.0000 | Freq: Three times a day (TID) | CUTANEOUS | 0 refills | Status: DC
Start: 2021-01-29 — End: 2021-09-07

## 2021-01-31 ENCOUNTER — Other Ambulatory Visit: Payer: Self-pay | Admitting: Family Medicine

## 2021-01-31 NOTE — Telephone Encounter (Signed)
Ok to refill 

## 2021-02-08 ENCOUNTER — Other Ambulatory Visit: Payer: Self-pay | Admitting: *Deleted

## 2021-02-08 MED ORDER — BENZONATATE 200 MG PO CAPS
200.0000 mg | ORAL_CAPSULE | Freq: Two times a day (BID) | ORAL | 0 refills | Status: DC | PRN
Start: 2021-02-08 — End: 2021-04-07

## 2021-02-08 NOTE — Telephone Encounter (Signed)
Received call from patient.   Requested refill on cough medication.   Prescription sent to pharmacy for Tessalon.

## 2021-02-12 ENCOUNTER — Other Ambulatory Visit: Payer: Self-pay

## 2021-02-12 ENCOUNTER — Emergency Department (HOSPITAL_COMMUNITY)
Admission: EM | Admit: 2021-02-12 | Discharge: 2021-02-12 | Disposition: A | Discharge status: Home / Self Care | Payer: Medicare Other | Attending: Emergency Medicine | Admitting: Emergency Medicine

## 2021-02-12 DIAGNOSIS — R110 Nausea: Secondary | ICD-10-CM | POA: Diagnosis not present

## 2021-02-12 DIAGNOSIS — R109 Unspecified abdominal pain: Secondary | ICD-10-CM | POA: Diagnosis not present

## 2021-02-12 DIAGNOSIS — R197 Diarrhea, unspecified: Secondary | ICD-10-CM | POA: Insufficient documentation

## 2021-02-12 DIAGNOSIS — J45909 Unspecified asthma, uncomplicated: Secondary | ICD-10-CM | POA: Insufficient documentation

## 2021-02-12 DIAGNOSIS — E860 Dehydration: Secondary | ICD-10-CM | POA: Diagnosis not present

## 2021-02-12 DIAGNOSIS — K219 Gastro-esophageal reflux disease without esophagitis: Secondary | ICD-10-CM | POA: Insufficient documentation

## 2021-02-12 DIAGNOSIS — I10 Essential (primary) hypertension: Secondary | ICD-10-CM | POA: Diagnosis not present

## 2021-02-12 DIAGNOSIS — Z87891 Personal history of nicotine dependence: Secondary | ICD-10-CM | POA: Insufficient documentation

## 2021-02-12 DIAGNOSIS — Z79899 Other long term (current) drug therapy: Secondary | ICD-10-CM | POA: Insufficient documentation

## 2021-02-12 DIAGNOSIS — R112 Nausea with vomiting, unspecified: Secondary | ICD-10-CM | POA: Diagnosis not present

## 2021-02-12 DIAGNOSIS — R000 Tachycardia, unspecified: Secondary | ICD-10-CM | POA: Diagnosis not present

## 2021-02-12 DIAGNOSIS — R531 Weakness: Secondary | ICD-10-CM | POA: Diagnosis present

## 2021-02-12 LAB — COMPREHENSIVE METABOLIC PANEL
ALT: 14 U/L (ref 0–44)
AST: 18 U/L (ref 15–41)
Albumin: 3.6 g/dL (ref 3.5–5.0)
Alkaline Phosphatase: 88 U/L (ref 38–126)
Anion gap: 16 — ABNORMAL HIGH (ref 5–15)
BUN: 14 mg/dL (ref 8–23)
CO2: 16 mmol/L — ABNORMAL LOW (ref 22–32)
Calcium: 9.3 mg/dL (ref 8.9–10.3)
Chloride: 103 mmol/L (ref 98–111)
Creatinine, Ser: 1.17 mg/dL — ABNORMAL HIGH (ref 0.44–1.00)
GFR, Estimated: 51 mL/min — ABNORMAL LOW (ref >60)
Glucose, Bld: 154 mg/dL — ABNORMAL HIGH (ref 70–99)
Potassium: 3.7 mmol/L (ref 3.5–5.1)
Sodium: 135 mmol/L (ref 135–145)
Total Bilirubin: 0.8 mg/dL (ref 0.3–1.2)
Total Protein: 7.7 g/dL (ref 6.5–8.1)

## 2021-02-12 LAB — CBC
HCT: 42.3 % (ref 36.0–46.0)
Hemoglobin: 14 g/dL (ref 12.0–15.0)
MCH: 31.7 pg (ref 26.0–34.0)
MCHC: 33.1 g/dL (ref 30.0–36.0)
MCV: 95.9 fL (ref 80.0–100.0)
Platelets: 331 10*3/uL (ref 150–400)
RBC: 4.41 MIL/uL (ref 3.87–5.11)
RDW: 14.8 % (ref 11.5–15.5)
WBC: 10.4 10*3/uL (ref 4.0–10.5)
nRBC: 0 % (ref 0.0–0.2)

## 2021-02-12 LAB — LIPASE, BLOOD: Lipase: 30 U/L (ref 11–51)

## 2021-02-12 MED ORDER — SODIUM CHLORIDE 0.9 % IV BOLUS
1000.0000 mL | Freq: Once | INTRAVENOUS | Status: AC
  Administered 2021-02-12: 1000 mL via INTRAVENOUS

## 2021-02-12 MED ORDER — ONDANSETRON HCL 4 MG/2ML IJ SOLN
4.0000 mg | Freq: Once | INTRAMUSCULAR | Status: AC
  Administered 2021-02-12: 4 mg via INTRAVENOUS
  Filled 2021-02-12: qty 2

## 2021-02-12 MED ORDER — ONDANSETRON 4 MG PO TBDP: 4.0000 mg | ORAL_TABLET | Freq: Three times a day (TID) | ORAL | 0 refills | Status: DC | PRN

## 2021-02-12 NOTE — Discharge Instructions (Addendum)
Take the prescribed medication as directed.  Try to continue pushing fluids at home to stay hydrated. Bland diet for now and progress back to normal as tolerated. Follow-up with your primary care doctor. Return to the ED for new or worsening symptoms.

## 2021-02-12 NOTE — ED Provider Notes (Signed)
[facility] EMERGENCY DEPARTMENT Provider Note   CSN: [id number] Arrival date & time: 02/12/21  1002     History Chief Complaint  Patient presents with  . Abdominal Pain  . Weakness  . Emesis    Peggy Fitzgerald is a 65 y.o. female.  The history is provided by the patient and medical records.  Abdominal Pain Associated symptoms: nausea   Weakness Associated symptoms: abdominal pain and nausea   Emesis Associated symptoms: abdominal pain     65 y.o. F with hx of seasonal allergies, bipolar disorder, depression, GERD, HLP, HTN, presenting to the ED for nausea and diarrhea.  States last week she was seen at PCP and diagnosed with viral bronchitis.  States she was given tessalon perles for cough but no other medications.  States since then she has had persistent nausea without vomiting but watery diarrhea.  Denies blood in stools.  States nausea is terrible and making it nearly impossible for her to eat/drink a full meal.  States she may eat a cracker or two for meals, small sips of liquids only.  States today she brought her husband to the ED while he was awaiting a cardiac cath.  States she started to feel progressively more weak and asked to lie down.  They encouraged her to come to the ED for evaluation.  She has not had any falls or syncopal events.  States she does have some abdominal cramping but denies any specific pain.  She has not had any chest pain or shortness of breath.  Past Medical History:  Diagnosis Date  . AC (acromioclavicular) joint bone spurs, right   . Allergy    seasonal  . Anxiety   . Asthma    asthmatic bronchitis  . Bipolar 1 disorder, depressed (HCC)   . Cataract 2007  . Depression   . GERD (gastroesophageal reflux disease)   . Herpes simplex type 2 infection   . High cholesterol   . Hyperlipidemia   . Hypertension   . Plantar fasciitis of right foot   . Prediabetes   . Rotator cuff tear    right shoulder    Patient Active  Problem List   Diagnosis Date Noted  . Migraine 05/21/2018  . Low back pain without sciatica 07/01/2017  . Urinary tract infection without hematuria 07/01/2017  . Vaginal dryness 07/01/2017  . Urinary frequency 07/01/2017  . Bipolar 1 disorder, mixed, moderate (HCC) 08/28/2016  . Insomnia 12/18/2015  . Headache 12/18/2015  . Vertigo 12/18/2015  . Allergy   . Anxiety   . Asthma   . Depression   . Prediabetes   . GERD (gastroesophageal reflux disease)   . Hyperlipidemia   . Hypertension   . Herpes simplex type 2 infection   . SHOULDER PAIN 04/24/2009  . IMPINGEMENT SYNDROME 04/24/2009    Past Surgical History:  Procedure Laterality Date  . CATARACT EXTRACTION, BILATERAL    . CHOLECYSTECTOMY  1987  . KNEE SURGERY Right      OB History    Gravida  3   Para  3   Term  3   Preterm      AB      Living  3     SAB      IAB      Ectopic      Multiple      Live Births  3           Family History  Problem Relation Age of  Onset  . Arthritis Mother   . Asthma Mother   . Hearing loss Mother   . Hyperlipidemia Mother   . Hypertension Mother   . Miscarriages / [place]   . Stroke Mother   . Hearing loss Father   . Hypertension Father   . Stroke Father   . Diabetes Father   . Bell's palsy Father   . Cancer Brother        leukemia  . Early death Brother   . Arthritis Maternal Grandmother   . Depression Maternal Grandmother   . Diabetes Maternal Grandmother   . Heart disease Maternal Grandmother        CHF  . Miscarriages / Stillbirths Maternal Grandmother   . Vision loss Maternal Grandmother   . Alcohol abuse Maternal Grandfather   . Heart attack Maternal Grandfather   . Arthritis Paternal Grandmother   . Heart disease Paternal Grandmother        massive heart attack  . Miscarriages / Stillbirths Paternal Grandmother   . Alcohol abuse Paternal Grandfather   . Stroke Paternal Grandfather   . Heart disease Paternal Grandfather         arteriosclerosis    Social History   Tobacco Use  . Smoking status: Former Smoker    Years: 0.50    Types: Cigarettes  . Smokeless tobacco: Never Used  Substance Use Topics  . Alcohol use: No  . Drug use: No    Home Medications Prior to Admission medications   Medication Sig Start Date End Date Taking? Authorizing Provider  acyclovir cream (ZOVIRAX) 5 % APPLY 1 APPLICATION TOPICALLY TWICE A DAY AS NEEDED FOR OUTBREAKS 10/10/20   Donita BrooksPickard, Warren T, MD  albuterol (VENTOLIN HFA) 108 (90 Base) MCG/ACT inhaler Inhale 2 puffs into the lungs every 4 (four) hours as needed for wheezing or shortness of breath. 12/15/20   Salley Scarleturham, Kawanta F, MD  atorvastatin (LIPITOR) 40 MG tablet TAKE 1 TABLET DAILY 01/04/21   Donita BrooksPickard, Warren T, MD  benzonatate (TESSALON) 200 MG capsule Take 1 capsule (200 mg total) by mouth 2 (two) times daily as needed for cough. 02/08/21   Donita BrooksPickard, Warren T, MD  Butalbital-APAP-Caffeine 50-300-40 MG CAPS TAKE 1 TO 2 CAPSULES EVERY 4 HOURS AS NEEDED (NOT TO EXCEED 6 CAPSULES DAILY) 02/01/21   Donita BrooksPickard, Warren T, MD  clobetasol (TEMOVATE) 0.05 % external solution Apply 1 application topically 2 (two) times daily. 10/12/20   Donita BrooksPickard, Warren T, MD  Clobetasol Propionate 0.05 % lotion Apply 1 application topically 2 (two) times daily. 10/12/20   Donita BrooksPickard, Warren T, MD  clotrimazole-betamethasone (LOTRISONE) cream Apply 1 application topically 2 (two) times daily. 04/18/20   Donita BrooksPickard, Warren T, MD  fluticasone (FLONASE) 50 MCG/ACT nasal spray Place 2 sprays into both nostrils daily. 12/22/20   Shaft, Velna HatchetKawanta F, MD  HYDROcodone-homatropine [facility](HYCODAN) 5-1.5 MG/5ML syrup Take 5 mLs by mouth every 8 (eight) hours as needed for cough. 12/15/20   Salley Scarleturham, Kawanta F, MD  hydrOXYzine (ATARAX/VISTARIL) 25 MG tablet Take 1 tablet (25 mg total) by mouth 3 (three) times daily as needed. 09/06/20   Salley Scarleturham, Kawanta F, MD  lamoTRIgine (LAMICTAL) 25 MG tablet Take 1 tablet (25 mg total) by mouth daily. Patient  taking differently: Take by mouth. Patient taking 100mg  in morning and 50mg  at night 08/30/16   Withrow, John C, FNP  LATUDA 80 MG TABS tablet Take 80 mg by mouth daily.  02/11/17   [provider]  LORazepam (ATIVAN) 0.5 MG tablet Take  1 tablet (0.5 mg total) by mouth every 8 (eight) hours as needed for anxiety (itching). 09/21/20   Donita Brooks, MD  meclizine (ANTIVERT) 25 MG tablet TAKE 1 TABLET AS NEEDED FOR DIZZINESS 11/08/20   Salley Scarlet, MD  methocarbamol (ROBAXIN) 500 MG tablet TAKE 1 TABLET FOUR TIMES A DAY 11/20/20   Donita Brooks, MD  metoprolol succinate (TOPROL-XL) 100 MG 24 hr tablet TAKE 1 TABLET AT BEDTIME WITH OR IMMEDIATELY FOLLOWING A MEAL  (CALL CLINIC TO SCHEDULE APPOINTMENT FOR FUTURE REFILLS. [phone]) 08/11/20   Donita Brooks, MD  metroNIDAZOLE (FLAGYL) 500 MG tablet Take 1 tablet (500 mg total) by mouth 2 (two) times daily. 12/15/20   Plains, Velna Hatchet, MD  montelukast (SINGULAIR) 10 MG tablet Take 1 tablet (10 mg total) by mouth at bedtime. 12/15/20   Salley Scarlet, MD  nystatin (MYCOSTATIN/NYSTOP) powder Apply 1 application topically 3 (three) times daily. 01/29/21   Donita Brooks, MD  promethazine (PHENERGAN) 25 MG tablet TAKE 1 TABLET EVERY 6 HOURS AS NEEDED FOR NAUSEA OR VOMITING 12/13/19   Donita Brooks, MD  valACYclovir (VALTREX) 500 MG tablet TAKE 1 TABLET DAILY 11/21/20   Donita Brooks, MD  zolpidem (AMBIEN) 10 MG tablet Take 10 mg by mouth at bedtime as needed for sleep.    [provider]    Allergies    Milk-related compounds and Oxycodone-acetaminophen  Review of Systems   Review of Systems  Gastrointestinal: Positive for abdominal pain and nausea.  Neurological: Positive for weakness.  All other systems reviewed and are negative.   Physical Exam Updated Vital Signs BP 140/85   Pulse (!) 112   Temp 98.7 F (37.1 C)   SpO2 96%   Physical Exam Vitals and nursing note reviewed.  Constitutional:       Appearance: [name] well-developed and well-nourished.  HENT:     Head: Normocephalic and atraumatic.     Mouth/Throat:     Mouth: Oropharynx is clear and moist.  Eyes:     Extraocular Movements: EOM normal.     Conjunctiva/sclera: Conjunctivae normal.     Pupils: Pupils are equal, round, and reactive to light.  Cardiovascular:     Rate and Rhythm: Normal rate and regular rhythm.     Heart sounds: Normal heart sounds.  Pulmonary:     Effort: Pulmonary effort is normal.     Breath sounds: Normal breath sounds.  Abdominal:     General: Bowel sounds are normal.     Palpations: Abdomen is soft.     Tenderness: There is no abdominal tenderness. There is no guarding or rebound.  Musculoskeletal:        General: Normal range of motion.     Cervical back: Normal range of motion.  Skin:    General: Skin is warm and dry.  Neurological:     Mental Status: [name] alert and oriented to person, place, and time.  Psychiatric:        Mood and Affect: Mood and affect normal.     ED Results / Procedures / Treatments   Labs (all labs ordered are listed, but only abnormal results are displayed) Labs Reviewed  COMPREHENSIVE METABOLIC PANEL - Abnormal; Notable for the following components:      Result Value   CO2 16 (*)    Glucose, Bld 154 (*)    Creatinine, Ser 1.17 (*)    GFR, Estimated 51 (*)    Anion gap 16 (*)  All other components within normal limits  LIPASE, BLOOD  CBC  URINALYSIS, ROUTINE W REFLEX MICROSCOPIC    EKG None  Radiology No results found.  Procedures Procedures   Medications Ordered in ED Medications  sodium chloride 0.9 % bolus 1,000 mL (0 mLs Intravenous Stopped 02/12/21 1222)  ondansetron (ZOFRAN) injection 4 mg (4 mg Intravenous Given 02/12/21 1118)    ED Course  I have reviewed the triage vital signs and the nursing notes.  Pertinent labs & imaging results that were available during my care of the patient were reviewed by me and considered in my  medical decision making (see chart for details).    MDM Rules/Calculators/A&P  65 year old female presenting to the ED with nausea and diarrhea.  States she has felt this over the past week.  [name] not had any vomiting but states the nausea is preventing her from eating and drinking as normal.  Diarrhea has been loose and watery, nonbloody.  No recent antibiotics use.  [name] afebrile and nontoxic in appearance here.  Her abdomen is soft and nontender.  1:52 PM After IVF and zofran patient states she is feeling better.  She has been able to tolerate cup of water.  She has gotten up and ambulate in the hall and states she feels fine when doing so.  No complaints of dizziness or near syncope.  Suspect some mild dehydration from likely viral GI illness.  Feel she is stable for discharge.  Rx zofran sent to pharmacy, encouraged to continue to push oral fluids at home.  Follow-up with PCP.  Return here for any new/acute changes.  Final Clinical Impression(s) / ED Diagnoses Final diagnoses:  Nausea  Diarrhea, unspecified type  Dehydration    Rx / DC Orders ED Discharge Orders         Ordered    ondansetron (ZOFRAN ODT) 4 MG disintegrating tablet  Every 8 hours PRN        02/12/21 1354           Garlon Hatchet, PA-C 02/12/21 1448    Virgina Norfolk, DO 02/13/21 [phone]

## 2021-02-12 NOTE — ED Triage Notes (Signed)
Pt arrives from cath lab waiting area while waiting for her husband's procedure, having abdominal cramping with n/v/d x 1 week. Today while in waiting area became more weak, feeling like she needed to lie down. Dx with bronchitis last week.

## 2021-02-12 NOTE — ED Notes (Signed)
Taken to cath lab to meet husband by security

## 2021-02-18 ENCOUNTER — Other Ambulatory Visit: Payer: Self-pay | Admitting: Family Medicine

## 2021-02-27 DIAGNOSIS — F319 Bipolar disorder, unspecified: Secondary | ICD-10-CM | POA: Diagnosis not present

## 2021-02-27 DIAGNOSIS — F411 Generalized anxiety disorder: Secondary | ICD-10-CM | POA: Diagnosis not present

## 2021-02-28 ENCOUNTER — Other Ambulatory Visit: Payer: Self-pay | Admitting: Family Medicine

## 2021-02-28 NOTE — Telephone Encounter (Signed)
Ok to refill 

## 2021-03-09 ENCOUNTER — Other Ambulatory Visit: Payer: Self-pay

## 2021-03-09 ENCOUNTER — Ambulatory Visit (HOSPITAL_COMMUNITY)
Admission: RE | Admit: 2021-03-09 | Discharge: 2021-03-09 | Disposition: A | Discharge status: Home / Self Care | Payer: Medicare Other | Source: Ambulatory Visit | Attending: Family Medicine | Admitting: Family Medicine

## 2021-03-09 DIAGNOSIS — Z1231 Encounter for screening mammogram for malignant neoplasm of breast: Secondary | ICD-10-CM | POA: Insufficient documentation

## 2021-03-13 ENCOUNTER — Encounter: Payer: Self-pay | Admitting: Family Medicine

## 2021-03-26 ENCOUNTER — Other Ambulatory Visit: Payer: Self-pay | Admitting: Family Medicine

## 2021-03-26 NOTE — Telephone Encounter (Signed)
Ok to refill 

## 2021-03-28 DIAGNOSIS — F3132 Bipolar disorder, current episode depressed, moderate: Secondary | ICD-10-CM | POA: Diagnosis not present

## 2021-04-07 ENCOUNTER — Other Ambulatory Visit: Payer: Self-pay

## 2021-04-07 ENCOUNTER — Encounter: Payer: Self-pay | Admitting: *Deleted

## 2021-04-07 ENCOUNTER — Ambulatory Visit
Admission: EM | Admit: 2021-04-07 | Discharge: 2021-04-07 | Disposition: A | Discharge status: Home / Self Care | Payer: Medicare Other | Attending: Emergency Medicine | Admitting: Emergency Medicine

## 2021-04-07 DIAGNOSIS — L304 Erythema intertrigo: Secondary | ICD-10-CM | POA: Diagnosis not present

## 2021-04-07 DIAGNOSIS — R21 Rash and other nonspecific skin eruption: Secondary | ICD-10-CM

## 2021-04-07 HISTORY — DX: Herpesviral infection of urogenital system, unspecified: A60.00

## 2021-04-07 HISTORY — DX: Candidiasis, unspecified: B37.9

## 2021-04-07 MED ORDER — FLUCONAZOLE 50 MG PO TABS: 50.0000 mg | ORAL_TABLET | Freq: Every day | ORAL | 0 refills | Status: DC

## 2021-04-07 MED ORDER — BUTENAFINE HCL 1 % EX CREA: 1.0000 "application " | TOPICAL_CREAM | Freq: Two times a day (BID) | CUTANEOUS | 0 refills | Status: DC

## 2021-04-07 NOTE — ED Provider Notes (Signed)
[facility]   [id number] 04/07/21 Arrival Time: 0825  CC: Yeast infection  SUBJECTIVE:  Peggy Fitzgerald is a 65 y.o. female who presents with rash x 3 days.  Reports rash to abdomen and right groin.  States she believes it is "due to my (her) fat."  Describes it as red and itchy.  Has tried clotriamzole/ beamethasone and nystatin with minimal relief.  Symptoms are made worse to the touch.  Reports similar symptoms in the past.   Denies fever, chills, nausea, vomiting, swelling, discharge, SOB, chest pain.  ROS: As per HPI.  All other pertinent ROS negative.     Past Medical History:  Diagnosis Date  . AC (acromioclavicular) joint bone spurs, right   . Allergy    seasonal  . Anxiety   . Asthma    asthmatic bronchitis  . Bipolar 1 disorder, depressed (HCC)   . Candida infection   . Cataract 2007  . Depression   . Genital herpes   . GERD (gastroesophageal reflux disease)   . Herpes simplex type 2 infection   . High cholesterol   . Hyperlipidemia   . Hypertension   . Plantar fasciitis of right foot   . Prediabetes   . Rotator cuff tear    right shoulder   Past Surgical History:  Procedure Laterality Date  . CATARACT EXTRACTION, BILATERAL    . CHOLECYSTECTOMY  1987  . KNEE SURGERY Right    Allergies  Allergen Reactions  . Milk-Related Compounds Diarrhea    Cannot drink plain milk, but can eat ice cream, and milk in cereal  . Oxycodone-Acetaminophen Itching    Nightmares   No current facility-administered medications on file prior to encounter.   Current Outpatient Medications on File Prior to Encounter  Medication Sig Dispense Refill  . atorvastatin (LIPITOR) 40 MG tablet TAKE 1 TABLET DAILY 90 tablet 3  . Butalbital-APAP-Caffeine 50-300-40 MG CAPS TAKE 1 TO 2 CAPSULES EVERY 4 HOURS AS NEEDED (NOT TO EXCEED 6 CAPSULES DAILY) 30 capsule 0  . clobetasol (TEMOVATE) 0.05 % external solution Apply 1 application topically 2 (two) times daily. 50 mL 0  .  Clobetasol Propionate 0.05 % lotion Apply 1 application topically 2 (two) times daily. 59 mL 0  . clotrimazole-betamethasone (LOTRISONE) cream Apply 1 application topically 2 (two) times daily. 45 g 3  . fluticasone (FLONASE) 50 MCG/ACT nasal spray Place 2 sprays into both nostrils daily. 16 g 6  . hydrOXYzine (ATARAX/VISTARIL) 25 MG tablet Take 1 tablet (25 mg total) by mouth 3 (three) times daily as needed.    . lamoTRIgine (LAMICTAL) 25 MG tablet Take 1 tablet (25 mg total) by mouth daily. (Patient taking differently: Take by mouth. Patient taking 100mg  in morning and 50mg  at night) 14 tablet 0  . LATUDA 80 MG TABS tablet Take 80 mg by mouth daily.     LORazepam (ATIVAN) 0.5 MG tablet Take 1 tablet (0.5 mg total) by mouth every 8 (eight) hours as needed for anxiety (itching). 30 tablet 0  . meclizine (ANTIVERT) 25 MG tablet TAKE 1 TABLET AS NEEDED FOR DIZZINESS 90 tablet 3  . methocarbamol (ROBAXIN) 500 MG tablet TAKE 1 TABLET FOUR TIMES A DAY 90 tablet 14  . metoprolol succinate (TOPROL-XL) 100 MG 24 hr tablet TAKE 1 TABLET AT BEDTIME WITH OR IMMEDIATELY FOLLOWING A MEAL  (CALL CLINIC TO SCHEDULE APPOINTMENT FOR FUTURE REFILLS. [phone]) 90 tablet 3  . nystatin (MYCOSTATIN/NYSTOP) powder Apply 1 application topically 3 (three) times daily.  15 g 0  . valACYclovir (VALTREX) 500 MG tablet TAKE 1 TABLET DAILY 90 tablet 3  . zolpidem (AMBIEN) 10 MG tablet Take 10 mg by mouth at bedtime as needed for sleep.    . [DISCONTINUED] montelukast (SINGULAIR) 10 MG tablet Take 1 tablet (10 mg total) by mouth at bedtime. 30 tablet 3  . acyclovir cream (ZOVIRAX) 5 % APPLY 1 APPLICATION TOPICALLY TWICE A DAY AS NEEDED FOR OUTBREAKS 15 g 3  . albuterol (VENTOLIN HFA) 108 (90 Base) MCG/ACT inhaler Inhale 2 puffs into the lungs every 4 (four) hours as needed for wheezing or shortness of breath. 18 g 1  . ondansetron (ZOFRAN ODT) 4 MG disintegrating tablet Take 1 tablet (4 mg total) by mouth every 8 (eight)  hours as needed for nausea. 12 tablet 0  . promethazine (PHENERGAN) 25 MG tablet TAKE 1 TABLET EVERY 6 HOURS AS NEEDED FOR NAUSEA OR VOMITING 90 tablet 14   Social History   Socioeconomic History  . Marital status: Married    Spouse name: Not on file  . Number of children: Not on file  . Years of education: Not on file  . Highest education level: Not on file  Occupational History  . Not on file  Tobacco Use  . Smoking status: Former Smoker    Years: 0.50    Types: Cigarettes  . Smokeless tobacco: Never Used  Vaping Use  . Vaping Use: Never used  Substance and Sexual Activity  . Alcohol use: No  . Drug use: No  . Sexual activity: Not on file    Comment: spouse vasectomy  Other Topics Concern  . Not on file  Social History Narrative  . Not on file   Social Determinants of Health   Financial Resource Strain: Not on file  Food Insecurity: Not on file  Transportation Needs: Not on file  Physical Activity: Not on file  Stress: Not on file  Social Connections: Not on file  Intimate Partner Violence: Not on file   Family History  Problem Relation Age of Onset  . Arthritis Mother   . Asthma Mother   . Hearing loss Mother   . Hyperlipidemia Mother   . Hypertension Mother   . Miscarriages / [place] Mother   . Stroke Mother   . Hearing loss Father   . Hypertension Father   . Stroke Father   . Diabetes Father   . Bell's palsy Father   . Cancer Brother        leukemia  . Early death Brother   . Arthritis Maternal Grandmother   . Depression Maternal Grandmother   . Diabetes Maternal Grandmother   . Heart disease Maternal Grandmother        CHF  . Miscarriages / Stillbirths Maternal Grandmother   . Vision loss Maternal Grandmother   . Alcohol abuse Maternal Grandfather   . Heart attack Maternal Grandfather   . Arthritis Paternal Grandmother   . Heart disease Paternal Grandmother        massive heart attack  . Miscarriages / Stillbirths Paternal Grandmother    . Alcohol abuse Paternal Grandfather   . Stroke Paternal Grandfather   . Heart disease Paternal Grandfather        arteriosclerosis    OBJECTIVE: Vitals:   04/07/21 0836  BP: (!) 159/89  Pulse: (!) 59  Resp: (!) 22  Temp: 97.6 F (36.4 C)  TempSrc: Oral  SpO2: 98%    General appearance: alert; no distress; appears uncomfortable, moves  slowly Head: NCAT Lungs: normal respiratory effort Extremities: no edema Skin: warm and dry; moist, red or red-brown, beefy, homogenous patches located to RT lower abdomen, under skin fold, and in RT groin fold; some satellite lesions, TTP Psychological: alert and cooperative; normal mood and affect  ASSESSMENT & PLAN:  1. Intertrigo   2. Rash and nonspecific skin eruption     Meds ordered this encounter  Medications  . Butenafine HCl 1 % cream    Sig: Apply 1 application topically 2 (two) times daily.    Dispense:  30 g    Refill:  0    Order Specific Question:   Supervising Provider    Answer:   Eustace Moore [[id number]]  . fluconazole (DIFLUCAN) 50 MG tablet    Sig: Take 1 tablet (50 mg total) by mouth daily.    Dispense:  20 tablet    Refill:  0    Order Specific Question:   Supervising Provider    Answer:   Eustace Moore [[id number]]    @[facility] @  Daily cleansing of skin folds with a mild cleanser followed by drying of affected area with a soft cloth or hair dryer on a cool setting Airing of affected area when feasible Daily application of drying powders Use of absorbent material or clothing, such as cotton or merino wool, to separate skin in folds Discontinue lotrisone, and begin using butenafine cream as directed.  Diflucan also prescribed.  Take daily up to four weeks Follow up with PCP Go to the ED if the patient has any new or worsening symptoms such as fever, chills, decreased appetite, decreased activity, drooling, vomiting, wheezing, rash, changes in bowel or bladder function, etc...   Reviewed expectations re:  course of current medical issues. Questions answered. Outlined signs and symptoms indicating need for more acute intervention. Patient verbalized understanding. After Visit Summary given.   [place], PA-C 04/07/21 [phone]

## 2021-04-07 NOTE — ED Triage Notes (Signed)
Pt reports getting frequent skin yeast infections, "they think it's due to my fat".  C/O painful red rash under right lower abd roll and right groin x3 days.  Has been using Clortrimazole/betamethasone and nystatin powder

## 2021-04-07 NOTE — Discharge Instructions (Signed)
Daily cleansing of skin folds with a mild cleanser followed by drying of affected area with a soft cloth or hair dryer on a cool setting Airing of affected area when feasible Daily application of drying powders Use of absorbent material or clothing, such as cotton or merino wool, to separate skin in folds Discontinue lotrisone, and begin using butenafine cream as directed.  Diflucan also prescribed.  Take daily up to four weeks Follow up with PCP Go to the ED if the patient has any new or worsening symptoms such as fever, chills, decreased appetite, decreased activity, drooling, vomiting, wheezing, rash, changes in bowel or bladder function, etc..Marland Kitchen

## 2021-04-18 DIAGNOSIS — F411 Generalized anxiety disorder: Secondary | ICD-10-CM | POA: Diagnosis not present

## 2021-04-18 DIAGNOSIS — F319 Bipolar disorder, unspecified: Secondary | ICD-10-CM | POA: Diagnosis not present

## 2021-04-20 DIAGNOSIS — F3132 Bipolar disorder, current episode depressed, moderate: Secondary | ICD-10-CM | POA: Diagnosis not present

## 2021-04-30 ENCOUNTER — Telehealth: Payer: Self-pay | Admitting: Family Medicine

## 2021-04-30 NOTE — Telephone Encounter (Signed)
Left message for patient to call back and schedule Medicare Annual Wellness Visit (AWV) in office.   If not able to come in office, please offer to do virtually or by telephone.   Last AWV: 04/18/2020   Please schedule at anytime with [facility]-Nurse Health Advisor.  If any questions, please contact me at [phone]

## 2021-05-01 ENCOUNTER — Other Ambulatory Visit: Payer: Self-pay | Admitting: *Deleted

## 2021-05-01 MED ORDER — BENZONATATE 200 MG PO CAPS: 200.0000 mg | ORAL_CAPSULE | Freq: Two times a day (BID) | ORAL | 0 refills | Status: DC | PRN

## 2021-05-01 NOTE — Telephone Encounter (Signed)
Received call from patient.   Requested refill on Tessalon and Hycodan for asthmatic bronchitis.   Call placed to patient to discuss. Advised Hycodan is unavailable at pharmacy per VM.   Tessalon sent to pharmacy.

## 2021-05-14 ENCOUNTER — Other Ambulatory Visit: Payer: Self-pay | Admitting: Family Medicine

## 2021-05-15 NOTE — Telephone Encounter (Signed)
Ok to refill 

## 2021-05-16 NOTE — Telephone Encounter (Signed)
PDMP reviewed.  Patient appears to be on this medication chronically.  Refill given in place of PCP who is out of office. 

## 2021-05-25 ENCOUNTER — Other Ambulatory Visit: Payer: Self-pay | Admitting: Family Medicine

## 2021-07-08 ENCOUNTER — Other Ambulatory Visit: Payer: Self-pay | Admitting: Family Medicine

## 2021-07-10 MED ORDER — BUTALBITAL-APAP-CAFFEINE 50-300-40 MG PO CAPS: ORAL_CAPSULE | ORAL | 0 refills | Status: DC

## 2021-07-10 NOTE — Telephone Encounter (Signed)
Ok to refill to mail order? 

## 2021-07-17 DIAGNOSIS — F319 Bipolar disorder, unspecified: Secondary | ICD-10-CM | POA: Diagnosis not present

## 2021-07-17 DIAGNOSIS — F411 Generalized anxiety disorder: Secondary | ICD-10-CM | POA: Diagnosis not present

## 2021-08-02 ENCOUNTER — Other Ambulatory Visit: Payer: Self-pay | Admitting: *Deleted

## 2021-08-06 ENCOUNTER — Other Ambulatory Visit: Payer: Self-pay | Admitting: Family Medicine

## 2021-08-22 ENCOUNTER — Other Ambulatory Visit: Payer: Self-pay | Admitting: *Deleted

## 2021-08-22 DIAGNOSIS — G43019 Migraine without aura, intractable, without status migrainosus: Secondary | ICD-10-CM | POA: Diagnosis not present

## 2021-08-22 DIAGNOSIS — R42 Dizziness and giddiness: Secondary | ICD-10-CM | POA: Diagnosis not present

## 2021-08-22 DIAGNOSIS — R251 Tremor, unspecified: Secondary | ICD-10-CM | POA: Diagnosis not present

## 2021-08-22 DIAGNOSIS — R9431 Abnormal electrocardiogram [ECG] [EKG]: Secondary | ICD-10-CM | POA: Diagnosis not present

## 2021-08-22 DIAGNOSIS — I10 Essential (primary) hypertension: Secondary | ICD-10-CM | POA: Diagnosis not present

## 2021-08-22 DIAGNOSIS — E669 Obesity, unspecified: Secondary | ICD-10-CM | POA: Diagnosis not present

## 2021-08-22 DIAGNOSIS — F319 Bipolar disorder, unspecified: Secondary | ICD-10-CM | POA: Diagnosis not present

## 2021-08-22 DIAGNOSIS — R110 Nausea: Secondary | ICD-10-CM | POA: Diagnosis not present

## 2021-08-22 DIAGNOSIS — Z87891 Personal history of nicotine dependence: Secondary | ICD-10-CM | POA: Diagnosis not present

## 2021-08-22 DIAGNOSIS — Z6830 Body mass index (BMI) 30.0-30.9, adult: Secondary | ICD-10-CM | POA: Diagnosis not present

## 2021-08-22 DIAGNOSIS — R2689 Other abnormalities of gait and mobility: Secondary | ICD-10-CM | POA: Diagnosis not present

## 2021-08-22 DIAGNOSIS — E785 Hyperlipidemia, unspecified: Secondary | ICD-10-CM | POA: Diagnosis not present

## 2021-08-22 DIAGNOSIS — E7800 Pure hypercholesterolemia, unspecified: Secondary | ICD-10-CM | POA: Diagnosis not present

## 2021-08-22 DIAGNOSIS — R531 Weakness: Secondary | ICD-10-CM | POA: Diagnosis not present

## 2021-08-22 DIAGNOSIS — Z79899 Other long term (current) drug therapy: Secondary | ICD-10-CM | POA: Diagnosis not present

## 2021-08-22 NOTE — Telephone Encounter (Signed)
Received fax requesting refill on Fioricet to mail order.   Ok to refill?  Ok to add refills to prescription?

## 2021-08-23 DIAGNOSIS — R42 Dizziness and giddiness: Secondary | ICD-10-CM | POA: Diagnosis not present

## 2021-08-23 DIAGNOSIS — I10 Essential (primary) hypertension: Secondary | ICD-10-CM | POA: Diagnosis not present

## 2021-08-23 DIAGNOSIS — E669 Obesity, unspecified: Secondary | ICD-10-CM | POA: Insufficient documentation

## 2021-08-23 DIAGNOSIS — G43019 Migraine without aura, intractable, without status migrainosus: Secondary | ICD-10-CM | POA: Diagnosis not present

## 2021-08-23 DIAGNOSIS — F319 Bipolar disorder, unspecified: Secondary | ICD-10-CM | POA: Diagnosis not present

## 2021-08-23 MED ORDER — BUTALBITAL-APAP-CAFFEINE 50-300-40 MG PO CAPS: ORAL_CAPSULE | ORAL | 2 refills | Status: DC

## 2021-08-24 DIAGNOSIS — I10 Essential (primary) hypertension: Secondary | ICD-10-CM | POA: Diagnosis not present

## 2021-08-24 DIAGNOSIS — R42 Dizziness and giddiness: Secondary | ICD-10-CM | POA: Diagnosis not present

## 2021-08-24 DIAGNOSIS — G43019 Migraine without aura, intractable, without status migrainosus: Secondary | ICD-10-CM | POA: Diagnosis not present

## 2021-08-24 DIAGNOSIS — F319 Bipolar disorder, unspecified: Secondary | ICD-10-CM | POA: Diagnosis not present

## 2021-09-05 ENCOUNTER — Telehealth: Payer: Self-pay | Admitting: Family Medicine

## 2021-09-05 NOTE — Telephone Encounter (Signed)
Patient called to follow up on Ed visit in [place]. Asked for nurse; patient decided to schedule appt.

## 2021-09-07 ENCOUNTER — Ambulatory Visit (INDEPENDENT_AMBULATORY_CARE_PROVIDER_SITE_OTHER): Payer: Medicare Other | Admitting: Family Medicine

## 2021-09-07 ENCOUNTER — Other Ambulatory Visit: Payer: Self-pay

## 2021-09-07 VITALS — BP 142/86 | HR 118 | Temp 97.4°F | Ht 66.0 in | Wt 200.0 lb

## 2021-09-07 DIAGNOSIS — Z8669 Personal history of other diseases of the nervous system and sense organs: Secondary | ICD-10-CM | POA: Diagnosis not present

## 2021-09-07 DIAGNOSIS — G43809 Other migraine, not intractable, without status migrainosus: Secondary | ICD-10-CM | POA: Diagnosis not present

## 2021-09-07 NOTE — Progress Notes (Signed)
Subjective:    Patient ID: Peggy Fitzgerald, female    DOB: [birth year], 65 y.o.   MRN: [id number]  HPI  Patient has a longstanding history of migraines.  She also sees a psychiatrist who manages her bipolar disorder.  [name] on Jordan along with lamotrigine.  Patient was recently in [place].  She developed sudden severe vertigo that was unrelenting.  She was admitted to the hospital.  Head CT was negative.  MRI of the brain revealed chronic microvascular changes but otherwise no acute findings were seen.  She was discharged home after having received physical therapy.  She denies being instructed to perform Epley maneuvers.  She states that the vertigo was constant.  Today she states that she is constantly feeling like the room is spinning.  [name] constantly nauseated.  She also reports constant headaches.  She was discharged home on amitriptyline 150 mg p.o. nightly.  It sounds like neurology was treating her for possible vestibular migraines rather than BPPV.  Patient states that she is never been tried on a prophylactic medication such as Topamax or gabapentin.  Currently she is taking Fioricet as needed for migraine headaches.  She has always taken medication with caffeine to manage her migraines therefore some of her headaches may be caffeine withdrawal headache/rebound headaches Past Medical History:  Diagnosis Date   AC (acromioclavicular) joint bone spurs, right    Allergy    seasonal   Anxiety    Asthma    asthmatic bronchitis   Bipolar 1 disorder, depressed (HCC)    Candida infection    Cataract 2007   Depression    Genital herpes    GERD (gastroesophageal reflux disease)    Herpes simplex type 2 infection    High cholesterol    Hyperlipidemia    Hypertension    Plantar fasciitis of right foot    Prediabetes    Rotator cuff tear    right shoulder   Past Surgical History:  Procedure Laterality Date   CATARACT EXTRACTION, BILATERAL     CHOLECYSTECTOMY  1987   KNEE  SURGERY Right    Current Outpatient Medications on File Prior to Visit  Medication Sig Dispense Refill   acyclovir cream (ZOVIRAX) 5 % APPLY 1 APPLICATION TOPICALLY TWICE A DAY AS NEEDED FOR OUTBREAKS 15 g 3   albuterol (VENTOLIN HFA) 108 (90 Base) MCG/ACT inhaler Inhale 2 puffs into the lungs every 4 (four) hours as needed for wheezing or shortness of breath. 18 g 1   amitriptyline (ELAVIL) 25 MG tablet Take 25 mg by mouth at bedtime.     atorvastatin (LIPITOR) 40 MG tablet TAKE 1 TABLET DAILY 90 tablet 3   Butalbital-APAP-Caffeine 50-300-40 MG CAPS TAKE 1 TO 2 CAPSULES EVERY 4 HOURS AS NEEDED. NOT TO EXCEED 6 CAPSULES DAILY 30 capsule 2   fluticasone (FLONASE) 50 MCG/ACT nasal spray Place 2 sprays into both nostrils daily. 16 g 6   hydrOXYzine (ATARAX/VISTARIL) 25 MG tablet Take 1 tablet (25 mg total) by mouth 3 (three) times daily as needed.     lamoTRIgine (LAMICTAL) 25 MG tablet Take 1 tablet (25 mg total) by mouth daily. (Patient taking differently: Take by mouth. Patient taking 100mg  in morning and 50mg  at night) 14 tablet 0   LATUDA 80 MG TABS tablet Take 80 mg by mouth daily.      LORazepam (ATIVAN) 0.5 MG tablet Take 1 tablet (0.5 mg total) by mouth every 8 (eight) hours as needed for anxiety (itching). 30  tablet 0   meclizine (ANTIVERT) 25 MG tablet TAKE 1 TABLET AS NEEDED FOR DIZZINESS 90 tablet 3   methocarbamol (ROBAXIN) 500 MG tablet TAKE 1 TABLET FOUR TIMES A DAY 90 tablet 14   metoprolol succinate (TOPROL-XL) 100 MG 24 hr tablet TAKE 1 TABLET AT BEDTIME WITH OR IMMEDIATELY FOLLOWING A MEAL  (CALL CLINIC TO SCHEDULE APPOINTMENT FOR FUTURE REFILLS. [phone]) 90 tablet 3   ondansetron (ZOFRAN ODT) 4 MG disintegrating tablet Take 1 tablet (4 mg total) by mouth every 8 (eight) hours as needed for nausea. 12 tablet 0   promethazine (PHENERGAN) 25 MG tablet TAKE 1 TABLET EVERY 6 HOURS AS NEEDED FOR NAUSEA OR VOMITING 90 tablet 14   valACYclovir (VALTREX) 500 MG tablet TAKE 1 TABLET  DAILY 90 tablet 3   zolpidem (AMBIEN) 10 MG tablet Take 10 mg by mouth at bedtime as needed for sleep.     [DISCONTINUED] montelukast (SINGULAIR) 10 MG tablet Take 1 tablet (10 mg total) by mouth at bedtime. 30 tablet 3   No current facility-administered medications on file prior to visit.     Allergies  Allergen Reactions   Milk-Related Compounds Diarrhea    Cannot drink plain milk, but can eat ice cream, and milk in cereal   Oxycodone-Acetaminophen Itching    Nightmares   Social History   Socioeconomic History   Marital status: Married    Spouse name: Not on file   Number of children: Not on file   Years of education: Not on file   Highest education level: Not on file  Occupational History   Not on file  Tobacco Use   Smoking status: Former    Years: 0.50    Types: Cigarettes   Smokeless tobacco: Never  Vaping Use   Vaping Use: Never used  Substance and Sexual Activity   Alcohol use: No   Drug use: No   Sexual activity: Not on file    Comment: spouse vasectomy  Other Topics Concern   Not on file  Social History Narrative   Not on file   Social Determinants of Health   Financial Resource Strain: Not on file  Food Insecurity: Not on file  Transportation Needs: Not on file  Physical Activity: Not on file  Stress: Not on file  Social Connections: Not on file  Intimate Partner Violence: Not on file   Family History  Problem Relation Age of Onset   Arthritis Mother    Asthma Mother    Hearing loss Mother    Hyperlipidemia Mother    Hypertension Mother    Miscarriages / [place] Mother    Stroke Mother    Hearing loss Father    Hypertension Father    Stroke Father    Diabetes Father    Bell's palsy Father    Cancer Brother        leukemia   Early death Brother    Arthritis Maternal Grandmother    Depression Maternal Grandmother    Diabetes Maternal Grandmother    Heart disease Maternal Grandmother        CHF   Miscarriages / Stillbirths Maternal  Grandmother    Vision loss Maternal Grandmother    Alcohol abuse Maternal Grandfather    Heart attack Maternal Grandfather    Arthritis Paternal Grandmother    Heart disease Paternal Grandmother        massive heart attack   Miscarriages / Stillbirths Paternal Grandmother    Alcohol abuse Paternal Grandfather    Stroke  Paternal Grandfather    Heart disease Paternal Grandfather        arteriosclerosis       Review of Systems  All other systems reviewed and are negative.     Objective:   Physical Exam Vitals reviewed.  Constitutional:      General: [name] not in acute distress.    Appearance: Normal appearance. [name] obese. [name] not ill-appearing, toxic-appearing or diaphoretic.  HENT:     Head: Normocephalic and atraumatic.     Nose: No congestion or rhinorrhea.  Eyes:     Extraocular Movements: Extraocular movements intact.     Conjunctiva/sclera: Conjunctivae normal.     Pupils: Pupils are equal, round, and reactive to light.  Neck:     Vascular: No carotid bruit.  Cardiovascular:     Rate and Rhythm: Normal rate and regular rhythm.     Pulses: Normal pulses.     Heart sounds: Normal heart sounds. No murmur heard.   No friction rub. No gallop.  Pulmonary:     Effort: Pulmonary effort is normal. No respiratory distress.     Breath sounds: Normal breath sounds. No stridor. No wheezing, rhonchi or rales.  Chest:     Chest wall: No tenderness.  Musculoskeletal:     Cervical back: No tenderness.  Neurological:     General: No focal deficit present.     Mental Status: [name] alert and oriented to person, place, and time. Mental status is at baseline.     Cranial Nerves: No cranial nerve deficit.     Sensory: No sensory deficit.     Motor: No weakness.     Coordination: Coordination normal.     Gait: Gait normal.     Deep Tendon Reflexes: Reflexes normal.  Psychiatric:        Mood and Affect: Mood normal.        Behavior: Behavior normal.        Thought  Content: Thought content normal.        Judgment: Judgment normal.          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  Differential diagnosis includes benign paroxysmal positional vertigo versus vestibular migraines.  History does not support BPPV as movement does not elicit vertigo.  Instead it is constant.  Furthermore the headaches are a daily phenomenon now which I believe is in keeping with the vertigo.  Therefore I believe that we would do well for the patient if we treated her for vestibular migraines.  I hesitate to add Topamax given the fact she is already on Lamictal.  Likewise I hesitate to add gabapentin given her history of bipolar syndrome due to possible medication interactions.  I feel that she would be a good candidate for Mobic and also instructed the patient to try to wean away from caffeine.  Patient would like to meet with a neurologist to get a second opinion which I will be glad to arrange

## 2021-09-28 ENCOUNTER — Encounter: Payer: Self-pay | Admitting: Family Medicine

## 2021-09-28 MED ORDER — CLOBETASOL PROPIONATE 0.05 % EX CREA: 1.0000 "application " | TOPICAL_CREAM | Freq: Two times a day (BID) | CUTANEOUS | 0 refills | Status: DC

## 2021-10-04 ENCOUNTER — Ambulatory Visit (INDEPENDENT_AMBULATORY_CARE_PROVIDER_SITE_OTHER): Payer: Medicare Other | Admitting: Psychiatry

## 2021-10-04 ENCOUNTER — Telehealth: Payer: Self-pay | Admitting: *Deleted

## 2021-10-04 ENCOUNTER — Other Ambulatory Visit: Payer: Self-pay

## 2021-10-04 ENCOUNTER — Encounter: Payer: Self-pay | Admitting: Psychiatry

## 2021-10-04 VITALS — BP 143/93 | HR 110 | Ht 66.0 in | Wt 200.0 lb

## 2021-10-04 DIAGNOSIS — G43701 Chronic migraine without aura, not intractable, with status migrainosus: Secondary | ICD-10-CM | POA: Diagnosis not present

## 2021-10-04 MED ORDER — KETOROLAC TROMETHAMINE 10 MG PO TABS: 10.0000 mg | ORAL_TABLET | Freq: Three times a day (TID) | ORAL | 0 refills | Status: DC

## 2021-10-04 MED ORDER — AMITRIPTYLINE HCL 150 MG PO TABS: 150.0000 mg | ORAL_TABLET | Freq: Every day | ORAL | 2 refills | Status: DC

## 2021-10-04 NOTE — Patient Instructions (Addendum)
Take robaxin nightly for one week to help break your headache Continue amitriptyline 150 mg nightly for 2 months, let me know if you develop side effects or if headache does not improve Take Toradol up to 3 time a day for 5 days to help break your headache. Do not take Aleve or ibuprofen while taking this medication

## 2021-10-04 NOTE — Telephone Encounter (Signed)
Received VM from patient in regards to a prescription cream.   Call placed to patient to inquire. LMTRC.
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
## 2021-10-05 MED ORDER — CLOBETASOL PROPIONATE 0.05 % EX SOLN: 1.0000 "application " | Freq: Two times a day (BID) | CUTANEOUS | 0 refills | Status: DC | PRN

## 2021-10-05 NOTE — Telephone Encounter (Signed)
Call placed to patient for more information.   Reports that Clobetasol Topical solution was prescribed my former PA in office.   Reports that refill was sent as Topical Cream.   Requested prescription be changed back to solution as this is used in her scalp.   Prescription sent to pharmacy with correction.

## 2021-10-09 DIAGNOSIS — F411 Generalized anxiety disorder: Secondary | ICD-10-CM | POA: Diagnosis not present

## 2021-10-09 DIAGNOSIS — F319 Bipolar disorder, unspecified: Secondary | ICD-10-CM | POA: Diagnosis not present

## 2021-10-18 ENCOUNTER — Other Ambulatory Visit: Payer: Self-pay | Admitting: *Deleted

## 2021-10-18 DIAGNOSIS — M7541 Impingement syndrome of right shoulder: Secondary | ICD-10-CM | POA: Diagnosis not present

## 2021-10-18 DIAGNOSIS — M25561 Pain in right knee: Secondary | ICD-10-CM | POA: Diagnosis not present

## 2021-10-18 DIAGNOSIS — M7542 Impingement syndrome of left shoulder: Secondary | ICD-10-CM | POA: Diagnosis not present

## 2021-10-18 MED ORDER — MONTELUKAST SODIUM 10 MG PO TABS: 10.0000 mg | ORAL_TABLET | Freq: Every day | ORAL | 3 refills | Status: DC

## 2021-10-23 ENCOUNTER — Other Ambulatory Visit: Payer: Self-pay | Admitting: Family Medicine

## 2021-10-26 ENCOUNTER — Ambulatory Visit (INDEPENDENT_AMBULATORY_CARE_PROVIDER_SITE_OTHER): Payer: Medicare Other

## 2021-10-26 ENCOUNTER — Other Ambulatory Visit: Payer: Self-pay

## 2021-10-26 VITALS — Ht 66.0 in | Wt 192.0 lb

## 2021-10-26 DIAGNOSIS — Z0000 Encounter for general adult medical examination without abnormal findings: Secondary | ICD-10-CM

## 2021-10-26 NOTE — Progress Notes (Signed)
Subjective:   Peggy Fitzgerald is a 65 y.o. female who presents for Medicare Annual (Subsequent) preventive examination. Virtual Visit via Telephone Note  I connected with  Peggy Fitzgerald on 10/26/21 at  3:30 PM EST by telephone and verified that I am speaking with the correct person using two identifiers.  Location: Patient: Home Provider: [facility] Persons participating in the virtual visit: patient/Nurse Health Advisor   I discussed the limitations, risks, security and privacy concerns of performing an evaluation and management service by telephone and the availability of in person appointments. The patient expressed understanding and agreed to proceed.  Interactive audio and video telecommunications were attempted between this nurse and patient, however failed, due to patient having technical difficulties OR patient did not have access to video capability.  We continued and completed visit with audio only.  Some vital signs may be absent or patient reported.   Darral Dash, LPN  Review of Systems     Cardiac Risk Factors include: dyslipidemia;hypertension;advanced age (>73men, >27 women);obesity (BMI >30kg/m2);sedentary lifestyle     Objective:    Today's Vitals   10/26/21 1449  Weight: 192 lb (87.1 kg)  Height:  (1.676 m)   Body mass index is 30.99 kg/m.  Advanced Directives 10/26/2021 07/01/2017 04/03/2017 08/27/2016 08/27/2016  Does Patient Have a Medical Advance Directive? No No No No No  Would patient like information on creating a medical advance directive? No - Patient declined - - No - patient declined information No - patient declined information  Some encounter information is confidential and restricted. Go to Review Flowsheets activity to see all data.    Current Medications (verified) Outpatient Encounter Medications as of 10/26/2021  Medication Sig   acyclovir cream (ZOVIRAX) 5 % APPLY 1 APPLICATION TOPICALLY TWICE A DAY AS NEEDED FOR  OUTBREAKS   albuterol (VENTOLIN HFA) 108 (90 Base) MCG/ACT inhaler Inhale 2 puffs into the lungs every 4 (four) hours as needed for wheezing or shortness of breath.   amitriptyline (ELAVIL) 150 MG tablet    atorvastatin (LIPITOR) 40 MG tablet TAKE 1 TABLET DAILY   Butalbital-APAP-Caffeine 50-300-40 MG CAPS TAKE 1 TO 2 CAPSULES EVERY 4 HOURS AS NEEDED. NOT TO EXCEED 6 CAPSULES DAILY   clobetasol (TEMOVATE) 0.05 % external solution Apply 1 application topically 2 (two) times daily as needed. Apply to scalp as needed for irritation.   fluticasone (FLONASE) 50 MCG/ACT nasal spray Place 2 sprays into both nostrils daily.   hydrOXYzine (ATARAX/VISTARIL) 25 MG tablet Take 1 tablet (25 mg total) by mouth 3 (three) times daily as needed.   ketorolac (TORADOL) 10 MG tablet Take 1 tablet (10 mg total) by mouth every 8 (eight) hours.   lamoTRIgine (LAMICTAL) 100 MG tablet    lamoTRIgine (LAMICTAL) 25 MG tablet Take 1 tablet (25 mg total) by mouth daily. (Patient taking differently: Take by mouth. Patient taking  in morning and  at night)   LATUDA 80 MG TABS tablet Take 80 mg by mouth daily.    LORazepam (ATIVAN) 0.5 MG tablet Take 1 tablet (0.5 mg total) by mouth every 8 (eight) hours as needed for anxiety (itching).   meclizine (ANTIVERT) 25 MG tablet TAKE 1 TABLET AS NEEDED FOR DIZZINESS   methocarbamol (ROBAXIN) 500 MG tablet TAKE 1 TABLET FOUR TIMES A DAY   metoprolol succinate (TOPROL-XL) 100 MG 24 hr tablet TAKE 1 TABLET AT BEDTIME WITH OR IMMEDIATELY FOLLOWING A MEAL  (CALL CLINIC TO SCHEDULE APPOINTMENT FOR FUTURE REFILLS. [phone])  montelukast (SINGULAIR) 10 MG tablet Take 1 tablet (10 mg total) by mouth at bedtime.   ondansetron (ZOFRAN ODT) 4 MG disintegrating tablet Take 1 tablet (4 mg total) by mouth every 8 (eight) hours as needed for nausea.   promethazine (PHENERGAN) 25 MG tablet TAKE 1 TABLET EVERY 6 HOURS AS NEEDED FOR NAUSEA OR VOMITING   valACYclovir (VALTREX) 500 MG tablet  TAKE 1 TABLET DAILY   zolpidem (AMBIEN) 10 MG tablet Take 10 mg by mouth at bedtime as needed for sleep.   [DISCONTINUED] amitriptyline (ELAVIL) 150 MG tablet Take 1 tablet (150 mg total) by mouth at bedtime.   No facility-administered encounter medications on file as of 10/26/2021.    Allergies (verified) Milk-related compounds and Oxycodone-acetaminophen   History: Past Medical History:  Diagnosis Date   AC (acromioclavicular) joint bone spurs, right    Allergy    seasonal   Anxiety    Asthma    asthmatic bronchitis   Bipolar 1 disorder, depressed (HCC)    Candida infection    Cataract 2007   Depression    Genital herpes    GERD (gastroesophageal reflux disease)    Headache    Herpes simplex type 2 infection    High cholesterol    Hyperlipidemia    Hypertension    Plantar fasciitis of right foot    Prediabetes    Rotator cuff tear    right shoulder   Past Surgical History:  Procedure Laterality Date   CATARACT EXTRACTION, BILATERAL     CHOLECYSTECTOMY  1987   KNEE SURGERY Right    Family History  Problem Relation Age of Onset   Arthritis Mother    Asthma Mother    Hearing loss Mother    Hyperlipidemia Mother    Hypertension Mother    Miscarriages / [place] Mother    Stroke Mother    Hearing loss Father    Hypertension Father    Stroke Father    Diabetes Father    Bell's palsy Father    Cancer Brother        leukemia   Early death Brother    Arthritis Maternal Grandmother    Depression Maternal Grandmother    Diabetes Maternal Grandmother    Heart disease Maternal Grandmother        CHF   Miscarriages / Stillbirths Maternal Grandmother    Vision loss Maternal Grandmother    Alcohol abuse Maternal Grandfather    Heart attack Maternal Grandfather    Arthritis Paternal Grandmother    Heart disease Paternal Grandmother        massive heart attack   Miscarriages / Stillbirths Paternal Grandmother    Alcohol abuse Paternal Grandfather    Stroke  Paternal Grandfather    Heart disease Paternal Grandfather        arteriosclerosis   Social History   Socioeconomic History   Marital status: Married    Spouse name: Homero Fellers   Number of children: 3   Years of education: Not on file   Highest education level: Not on file  Occupational History   Not on file  Tobacco Use   Smoking status: Former    Years: 0.50    Types: Cigarettes   Smokeless tobacco: Never   Tobacco comments:    Quit many years ago  Vaping Use   Vaping Use: Never used  Substance and Sexual Activity   Alcohol use: No   Drug use: No   Sexual activity: Not on file  Comment: spouse vasectomy  Other Topics Concern   Not on file  Social History Narrative   Lives with husband.    14 grandchildren and 1 expected in 2023.   Caffeine- tea 1 glass   Social Determinants of Health   Financial Resource Strain: Low Risk    Difficulty of Paying Living Expenses: Not hard at all  Food Insecurity: No Food Insecurity   Worried About Programme researcher, broadcasting/film/video in the Last Year: Never true   Ran Out of Food in the Last Year: Never true  Transportation Needs: No Transportation Needs   Lack of Transportation (Medical): No   Lack of Transportation (Non-Medical): No  Physical Activity: Inactive   Days of Exercise per Week: 0 days   Minutes of Exercise per Session: 0 min  Stress: No Stress Concern Present   Feeling of Stress : Only a little  Social Connections: Press photographer of Communication with Friends and Family: More than three times a week   Frequency of Social Gatherings with Friends and Family: More than three times a week   Attends Religious Services: More than 4 times per year   Active Member of [employer] or Organizations: Yes   Attends Engineer, structural: More than 4 times per year   Marital Status: Married    Tobacco Counseling Counseling given: Not Answered Tobacco comments: Quit many years ago   Clinical Intake:  Pre-visit  preparation completed: Yes  Pain : No/denies pain     BMI - recorded: 30.99 Nutritional Status: BMI > 30  Obese Nutritional Risks: None Diabetes: No  How often do you need to have someone help you when you read instructions, pamphlets, or other written materials from your doctor or pharmacy?: 1 - Never  Diabetic?No  Interpreter Needed?: No  Information entered by :: MJ Alaynna Kerwood, LPN   Activities of Daily Living In your present state of health, do you have any difficulty performing the following activities: 10/26/2021  Hearing? N  Vision? N  Difficulty concentrating or making decisions? Y  Comment Making decisions.  Walking or climbing stairs? N  Dressing or bathing? N  Doing errands, shopping? N  Preparing Food and eating ? N  Using the Toilet? N  In the past six months, have you accidently leaked urine? N  Do you have problems with loss of bowel control? N  Managing your Medications? N  Managing your Finances? N  Housekeeping or managing your Housekeeping? N  Some recent data might be hidden    Patient Care Team: Donita Brooks, MD as PCP - General (Family Medicine)  Indicate any recent Medical Services you may have received from other than [facility] providers in the past year (date may be approximate).     Assessment:   This is a routine wellness examination for Peggy Fitzgerald.  Hearing/Vision screen Hearing Screening - Comments:: No hearing issues.  Vision Screening - Comments:: Glasses. [facility]. 2021.  Dietary issues and exercise activities discussed: Current Exercise Habits: The patient does not participate in regular exercise at present, Exercise limited by: cardiac condition(s);respiratory conditions(s)   Goals Addressed             This Visit's Progress    DIET - REDUCE CALORIE INTAKE       "Take better care of myself and lose more weight."       Depression Screen PHQ 2/9 Scores 10/26/2021 09/07/2021 04/18/2020 08/06/2017 04/03/2017  PHQ - 2  Score 0 0 0 0 6  PHQ- 9 Score - - - - 21    Fall Risk Fall Risk  10/26/2021 09/07/2021  Falls in the past year? 0 0  Number falls in past yr: 0 0  Injury with Fall? 0 0  Risk for fall due to : No Fall Risks No Fall Risks  Follow up Falls prevention discussed Falls evaluation completed    FALL RISK PREVENTION PERTAINING TO THE HOME:  Any stairs in or around the home? Yes  If so, are there any without handrails? No  Home free of loose throw rugs in walkways, pet beds, electrical cords, etc? Yes  Adequate lighting in your home to reduce risk of falls? Yes   ASSISTIVE DEVICES UTILIZED TO PREVENT FALLS:  Life alert? Yes  Use of a cane, walker or w/c? No  Grab bars in the bathroom? Yes  Shower chair or bench in shower? No  Elevated toilet seat or a handicapped toilet? Yes   TIMED UP AND GO:  Was the test performed? No . Phone visit.    Cognitive Function:     6CIT Screen 10/26/2021  What Year? 0 points  What month? 0 points  What time? 0 points  Count back from 20 0 points  Months in reverse 0 points  Repeat phrase 2 points  Total Score 2    Immunizations Immunization History  Administered Date(s) Administered   Influenza,inj,Quad PF,6+ Mos 08/27/2018   Influenza,inj,quad, With Preservative 10/18/2021   Pneumococcal-Unspecified 10/24/2019   Zoster Recombinat (Shingrix) 08/27/2018, 02/10/2019    TDAP status: Due, Education has been provided regarding the importance of this vaccine. Advised may receive this vaccine at local pharmacy or Health Dept. Aware to provide a copy of the vaccination record if obtained from local pharmacy or Health Dept. Verbalized acceptance and understanding.  Flu Vaccine status: Up to date  Pneumococcal vaccine status: Due, Education has been provided regarding the importance of this vaccine. Advised may receive this vaccine at local pharmacy or Health Dept. Aware to provide a copy of the vaccination record if obtained from local pharmacy or  Health Dept. Verbalized acceptance and understanding.  Covid-19 vaccine status: Declined, Education has been provided regarding the importance of this vaccine but patient still declined. Advised may receive this vaccine at local pharmacy or Health Dept.or vaccine clinic. Aware to provide a copy of the vaccination record if obtained from local pharmacy or Health Dept. Verbalized acceptance and understanding.  Qualifies for Shingles Vaccine? Yes   Zostavax completed Yes   Shingrix Completed?: Yes  Screening Tests Health Maintenance  Topic Date Due   COVID-19 Vaccine (1) Never done   Pneumonia Vaccine 8+ Years old (1 - PCV) 12/29/1961   TETANUS/TDAP  Never done   COLONOSCOPY (Pts 45-19yrs Insurance coverage will need to be confirmed)  Never done   DEXA SCAN  Never done   MAMMOGRAM  03/10/2023   INFLUENZA VACCINE  Completed   Hepatitis C Screening  Completed   Zoster Vaccines- Shingrix  Completed   HPV VACCINES  Aged Out    Health Maintenance  Health Maintenance Due  Topic Date Due   COVID-19 Vaccine (1) Never done   Pneumonia Vaccine 33+ Years old (1 - PCV) 12/29/1961   TETANUS/TDAP  Never done   COLONOSCOPY (Pts 45-41yrs Insurance coverage will need to be confirmed)  Never done   DEXA SCAN  Never done    Colorectal cancer screening: Type of screening: Cologuard. Completed 05/15/2020. Repeat every 3 years  Mammogram status: Completed 03/09/2021. Repeat every  year  Bone Density status: Ordered PT WOULD LIKE TO WAIT AND SCHEDULE IN 2024 ALONG WITH MAMMOGRAM. Pt provided with contact info and advised to call to schedule appt.  Lung Cancer Screening: (Low Dose CT Chest recommended if Age 73-80 years, 30 pack-year currently smoking OR have quit w/in 15years.) does not qualify.  QUIT OVER 15 YEARS AGO.  Additional Screening:  Hepatitis C Screening: does qualify; Completed 01/29/2016  Vision Screening: Recommended annual ophthalmology exams for early detection of glaucoma and other  disorders of the eye. Is the patient up to date with their annual eye exam?  Yes  Who is the provider or what is the name of the office in which the patient attends annual eye exams? [facility] If pt is not established with a provider, would they like to be referred to a provider to establish care? No .   Dental Screening: Recommended annual dental exams for proper oral hygiene  Community Resource Referral / Chronic Care Management: CRR required this visit?  No   CCM required this visit?  No      Plan:     I have personally reviewed and noted the following in the patient's chart:   Medical and social history Use of alcohol, tobacco or illicit drugs  Current medications and supplements including opioid prescriptions.  Functional ability and status Nutritional status Physical activity Advanced directives List of other physicians Hospitalizations, surgeries, and ER visits in previous 12 months Vitals Screenings to include cognitive, depression, and falls Referrals and appointments  In addition, I have reviewed and discussed with patient certain preventive protocols, quality metrics, and best practice recommendations. A written personalized care plan for preventive services as well as general preventive health recommendations were provided to patient.     Darral Dash, LPN   [id number]   Nurse Notes: Up to date on health maintenance. Discussed second pneumococcal vaccine and tdap vaccine and how to obtain.

## 2021-10-26 NOTE — Patient Instructions (Signed)
Peggy Fitzgerald , Thank you for taking time to come for your Medicare Wellness Visit. I appreciate your ongoing commitment to your health goals. Please review the following plan we discussed and let me know if I can assist you in the future.   Screening recommendations/referrals: Colonoscopy: Cologuard done 05/15/2020. Repeat every 3 years. Mammogram: Done 03/09/21 Repeat every 2 years  Bone Density: Call to schedule at same time as mammogram in 2024.  Recommended yearly ophthalmology/optometry visit for glaucoma screening and checkup Recommended yearly dental visit for hygiene and checkup  Vaccinations: Influenza vaccine: Done 10/18/2021 Pneumococcal vaccine: Done 10/24/2019, Second dose due at your convenience.  Tdap vaccine: Due Repeat in 10 years  Shingles vaccine: Done 02/10/2019 and 08/27/2018   Covid-19:Declined.   Advanced directives: Advance directive discussed with you today. Even though you declined this today, please call our office should you change your mind, and we can give you the proper paperwork for you to fill out.   Conditions/risks identified: Aim for 30 minutes of exercise or brisk walking each day, drink 6-8 glasses of water and eat lots of fruits and vegetables.   Next appointment: Follow up in one year for your annual wellness visit 2023.   Preventive Care 67 Years and Older, Female Preventive care refers to lifestyle choices and visits with your health care provider that can promote health and wellness. What does preventive care include? A yearly physical exam. This is also called an annual well check. Dental exams once or twice a year. Routine eye exams. Ask your health care provider how often you should have your eyes checked. Personal lifestyle choices, including: Daily care of your teeth and gums. Regular physical activity. Eating a healthy diet. Avoiding tobacco and drug use. Limiting alcohol use. Practicing safe sex. Taking low-dose aspirin every  day. Taking vitamin and mineral supplements as recommended by your health care provider. What happens during an annual well check? The services and screenings done by your health care provider during your annual well check will depend on your age, overall health, lifestyle risk factors, and family history of disease. Counseling  Your health care provider may ask you questions about your: Alcohol use. Tobacco use. Drug use. Emotional well-being. Home and relationship well-being. Sexual activity. Eating habits. History of falls. Memory and ability to understand (cognition). Work and work Astronomer. Reproductive health. Screening  You may have the following tests or measurements: Height, weight, and BMI. Blood pressure. Lipid and cholesterol levels. These may be checked every 5 years, or more frequently if you are over 20 years old. Skin check. Lung cancer screening. You may have this screening every year starting at age 31 if you have a 30-pack-year history of smoking and currently smoke or have quit within the past 15 years. Fecal occult blood test (FOBT) of the stool. You may have this test every year starting at age 73. Flexible sigmoidoscopy or colonoscopy. You may have a sigmoidoscopy every 5 years or a colonoscopy every 10 years starting at age 32. Hepatitis C blood test. Hepatitis B blood test. Sexually transmitted disease (STD) testing. Diabetes screening. This is done by checking your blood sugar (glucose) after you have not eaten for a while (fasting). You may have this done every 1-3 years. Bone density scan. This is done to screen for osteoporosis. You may have this done starting at age 54. Mammogram. This may be done every 1-2 years. Talk to your health care provider about how often you should have regular mammograms. Talk with your  health care provider about your test results, treatment options, and if necessary, the need for more tests. Vaccines  Your health care  provider may recommend certain vaccines, such as: Influenza vaccine. This is recommended every year. Tetanus, diphtheria, and acellular pertussis (Tdap, Td) vaccine. You may need a Td booster every 10 years. Zoster vaccine. You may need this after age 47. Pneumococcal 13-valent conjugate (PCV13) vaccine. One dose is recommended after age 77. Pneumococcal polysaccharide (PPSV23) vaccine. One dose is recommended after age 71. Talk to your health care provider about which screenings and vaccines you need and how often you need them. This information is not intended to replace advice given to you by your health care provider. Make sure you discuss any questions you have with your health care provider. Document Released: 12/22/2015 Document Revised: 08/14/2016 Document Reviewed: 09/26/2015 Elsevier Interactive Patient Education  2017 [employer] Prevention in the Home Falls can cause injuries. They can happen to people of all ages. There are many things you can do to make your home safe and to help prevent falls. What can I do on the outside of my home? Regularly fix the edges of walkways and driveways and fix any cracks. Remove anything that might make you trip as you walk through a door, such as a raised step or threshold. Trim any bushes or trees on the path to your home. Use bright outdoor lighting. Clear any walking paths of anything that might make someone trip, such as rocks or tools. Regularly check to see if handrails are loose or broken. Make sure that both sides of any steps have handrails. Any raised decks and porches should have guardrails on the edges. Have any leaves, snow, or ice cleared regularly. Use sand or salt on walking paths during winter. Clean up any spills in your garage right away. This includes oil or grease spills. What can I do in the bathroom? Use night lights. Install grab bars by the toilet and in the tub and shower. Do not use towel bars as grab  bars. Use non-skid mats or decals in the tub or shower. If you need to sit down in the shower, use a plastic, non-slip stool. Keep the floor dry. Clean up any water that spills on the floor as soon as it happens. Remove soap buildup in the tub or shower regularly. Attach bath mats securely with double-sided non-slip rug tape. Do not have throw rugs and other things on the floor that can make you trip. What can I do in the bedroom? Use night lights. Make sure that you have a light by your bed that is easy to reach. Do not use any sheets or blankets that are too big for your bed. They should not hang down onto the floor. Have a firm chair that has side arms. You can use this for support while you get dressed. Do not have throw rugs and other things on the floor that can make you trip. What can I do in the kitchen? Clean up any spills right away. Avoid walking on wet floors. Keep items that you use a lot in easy-to-reach places. If you need to reach something above you, use a strong step stool that has a grab bar. Keep electrical cords out of the way. Do not use floor polish or wax that makes floors slippery. If you must use wax, use non-skid floor wax. Do not have throw rugs and other things on the floor that can make you trip. What  can I do with my stairs? Do not leave any items on the stairs. Make sure that there are handrails on both sides of the stairs and use them. Fix handrails that are broken or loose. Make sure that handrails are as long as the stairways. Check any carpeting to make sure that it is firmly attached to the stairs. Fix any carpet that is loose or worn. Avoid having throw rugs at the top or bottom of the stairs. If you do have throw rugs, attach them to the floor with carpet tape. Make sure that you have a light switch at the top of the stairs and the bottom of the stairs. If you do not have them, ask someone to add them for you. What else can I do to help prevent  falls? Wear shoes that: Do not have high heels. Have rubber bottoms. Are comfortable and fit you well. Are closed at the toe. Do not wear sandals. If you use a stepladder: Make sure that it is fully opened. Do not climb a closed stepladder. Make sure that both sides of the stepladder are locked into place. Ask someone to hold it for you, if possible. Clearly mark and make sure that you can see: Any grab bars or handrails. First and last steps. Where the edge of each step is. Use tools that help you move around (mobility aids) if they are needed. These include: Canes. Walkers. Scooters. Crutches. Turn on the lights when you go into a dark area. Replace any light bulbs as soon as they burn out. Set up your furniture so you have a clear path. Avoid moving your furniture around. If any of your floors are uneven, fix them. If there are any pets around you, be aware of where they are. Review your medicines with your doctor. Some medicines can make you feel dizzy. This can increase your chance of falling. Ask your doctor what other things that you can do to help prevent falls. This information is not intended to replace advice given to you by your health care provider. Make sure you discuss any questions you have with your health care provider. Document Released: 09/21/2009 Document Revised: 05/02/2016 Document Reviewed: 12/30/2014 Elsevier Interactive Patient Education  2017 [employer].

## 2021-11-06 ENCOUNTER — Other Ambulatory Visit: Payer: Self-pay | Admitting: Family Medicine

## 2021-11-07 ENCOUNTER — Encounter: Payer: Self-pay | Admitting: Family Medicine

## 2021-11-07 NOTE — Telephone Encounter (Signed)
Does pt need OV to discuss these multiple concerns? Please advise, thanks!

## 2021-11-07 NOTE — Telephone Encounter (Signed)
Last apt 09/07/21

## 2021-11-08 DIAGNOSIS — F3132 Bipolar disorder, current episode depressed, moderate: Secondary | ICD-10-CM | POA: Diagnosis not present

## 2021-11-08 DIAGNOSIS — F411 Generalized anxiety disorder: Secondary | ICD-10-CM | POA: Diagnosis not present

## 2021-11-13 ENCOUNTER — Other Ambulatory Visit: Payer: Self-pay

## 2021-11-13 ENCOUNTER — Encounter: Payer: Self-pay | Admitting: Family Medicine

## 2021-11-13 ENCOUNTER — Ambulatory Visit (INDEPENDENT_AMBULATORY_CARE_PROVIDER_SITE_OTHER): Payer: Medicare Other | Admitting: Family Medicine

## 2021-11-13 VITALS — BP 112/72 | HR 70 | Temp 97.3°F | Resp 18 | Ht 66.0 in | Wt 191.0 lb

## 2021-11-13 DIAGNOSIS — J040 Acute laryngitis: Secondary | ICD-10-CM | POA: Diagnosis not present

## 2021-11-13 NOTE — Progress Notes (Signed)
Subjective:    Patient ID: Peggy Fitzgerald, female    DOB: 1956-06-11, 65 y.o.   MRN: [id number]  HPI  Patient reports a cough for 4 days.  Cough is productive of small amounts of green sputum.  [name] also had laryngitis for the last 24 hours.  [name] very "although her voice breaks several times during the conversation.  She denies any fever or chills.  She denies any chest pain or pleurisy.  She denies any hemoptysis.  She also reports persistent vertigo.  [name] seeing a neurologist who recommended trying amitriptyline for vestibular migraines.  She has an appointment to see her neurologist later in 3 months.  [name] already had an MRI in [place] that was normal.  [name] also concerned about some mild short-term memory loss.  She states that she is having trouble remembering names and occasionally forgetting conversations.  She denies getting lost while driving.  She denies leaving on appliances.  She denies word finding aphasia.  She denies forgetting to pay bills.  She denies apraxia.  She believes that this is more focused on her mom because her mother is dealing with dementia Past Medical History:  Diagnosis Date   AC (acromioclavicular) joint bone spurs, right    Allergy    seasonal   Anxiety    Asthma    asthmatic bronchitis   Bipolar 1 disorder, depressed (HCC)    Candida infection    Cataract 2007   Depression    Genital herpes    GERD (gastroesophageal reflux disease)    Headache    Herpes simplex type 2 infection    High cholesterol    Hyperlipidemia    Hypertension    Plantar fasciitis of right foot    Prediabetes    Rotator cuff tear    right shoulder   Past Surgical History:  Procedure Laterality Date   CATARACT EXTRACTION, BILATERAL     CHOLECYSTECTOMY  1987   KNEE SURGERY Right    Current Outpatient Medications on File Prior to Visit  Medication Sig Dispense Refill   acyclovir cream (ZOVIRAX) 5 % APPLY 1 APPLICATION TOPICALLY TWICE A DAY AS NEEDED  FOR OUTBREAKS 15 g 3   albuterol (VENTOLIN HFA) 108 (90 Base) MCG/ACT inhaler Inhale 2 puffs into the lungs every 4 (four) hours as needed for wheezing or shortness of breath. 18 g 1   amitriptyline (ELAVIL) 150 MG tablet      atorvastatin (LIPITOR) 40 MG tablet TAKE 1 TABLET DAILY 90 tablet 3   Butalbital-APAP-Caffeine 50-300-40 MG CAPS TAKE 1 TO 2 CAPSULES EVERY 4 HOURS AS NEEDED, NOT TO EXCEED 6 CAPSULES DAILY 30 capsule 2   clobetasol (TEMOVATE) 0.05 % external solution Apply 1 application topically 2 (two) times daily as needed. Apply to scalp as needed for irritation. 150 mL 0   fluticasone (FLONASE) 50 MCG/ACT nasal spray Place 2 sprays into both nostrils daily. 16 g 6   hydrOXYzine (ATARAX/VISTARIL) 25 MG tablet Take 1 tablet (25 mg total) by mouth 3 (three) times daily as needed.     ketorolac (TORADOL) 10 MG tablet Take 1 tablet (10 mg total) by mouth every 8 (eight) hours. 15 tablet 0   lamoTRIgine (LAMICTAL) 100 MG tablet      lamoTRIgine (LAMICTAL) 25 MG tablet Take 1 tablet (25 mg total) by mouth daily. (Patient taking differently: Take by mouth. Patient taking 100mg  in morning and 50mg  at night) 14 tablet 0   LATUDA 80  MG TABS tablet Take 80 mg by mouth daily.      LORazepam (ATIVAN) 0.5 MG tablet Take 1 tablet (0.5 mg total) by mouth every 8 (eight) hours as needed for anxiety (itching). 30 tablet 0   meclizine (ANTIVERT) 25 MG tablet TAKE 1 TABLET AS NEEDED FOR DIZZINESS 90 tablet 3   methocarbamol (ROBAXIN) 500 MG tablet TAKE 1 TABLET FOUR TIMES A DAY 90 tablet 14   metoprolol succinate (TOPROL-XL) 100 MG 24 hr tablet TAKE 1 TABLET AT BEDTIME WITH OR IMMEDIATELY FOLLOWING A MEAL  (CALL CLINIC TO SCHEDULE APPOINTMENT FOR FUTURE REFILLS. [phone]) 90 tablet 3   montelukast (SINGULAIR) 10 MG tablet Take 1 tablet (10 mg total) by mouth at bedtime. 30 tablet 3   ondansetron (ZOFRAN ODT) 4 MG disintegrating tablet Take 1 tablet (4 mg total) by mouth every 8 (eight) hours as needed  for nausea. 12 tablet 0   promethazine (PHENERGAN) 25 MG tablet TAKE 1 TABLET EVERY 6 HOURS AS NEEDED FOR NAUSEA OR VOMITING 90 tablet 14   valACYclovir (VALTREX) 500 MG tablet TAKE 1 TABLET DAILY 90 tablet 3   zolpidem (AMBIEN) 5 MG tablet Take 5 mg by mouth at bedtime as needed.     No current facility-administered medications on file prior to visit.     Allergies  Allergen Reactions   Milk-Related Compounds Diarrhea    Cannot drink plain milk, but can eat ice cream, and milk in cereal   Oxycodone-Acetaminophen Itching    Nightmares   Social History   Socioeconomic History   Marital status: Married    Spouse name: Peggy Fitzgerald   Number of children: 3   Years of education: Not on file   Highest education level: Not on file  Occupational History   Not on file  Tobacco Use   Smoking status: Former    Years: 0.50    Types: Cigarettes   Smokeless tobacco: Never   Tobacco comments:    Quit many years ago  Vaping Use   Vaping Use: Never used  Substance and Sexual Activity   Alcohol use: No   Drug use: No   Sexual activity: Not on file    Comment: spouse vasectomy  Other Topics Concern   Not on file  Social History Narrative   Lives with husband.    14 grandchildren and 1 expected in 2023.   Caffeine- tea 1 glass   Social Determinants of Health   Financial Resource Strain: Low Risk    Difficulty of Paying Living Expenses: Not hard at all  Food Insecurity: No Food Insecurity   Worried About Programme researcher, broadcasting/film/video in the Last Year: Never true   Ran Out of Food in the Last Year: Never true  Transportation Needs: No Transportation Needs   Lack of Transportation (Medical): No   Lack of Transportation (Non-Medical): No  Physical Activity: Inactive   Days of Exercise per Week: 0 days   Minutes of Exercise per Session: 0 min  Stress: No Stress Concern Present   Feeling of Stress : Only a little  Social Connections: Press photographer of Communication with Friends  and Family: More than three times a week   Frequency of Social Gatherings with Friends and Family: More than three times a week   Attends Religious Services: More than 4 times per year   Active Member of [employer] or Organizations: Yes   Attends Banker Meetings: More than 4 times per year   Marital Status:  Married  Catering manager Violence: Not At Risk   Fear of Current or Ex-Partner: No   Emotionally Abused: No   Physically Abused: No   Sexually Abused: No   Family History  Problem Relation Age of Onset   Arthritis Mother    Asthma Mother    Hearing loss Mother    Hyperlipidemia Mother    Hypertension Mother    Miscarriages / [place] Mother    Stroke Mother    Hearing loss Father    Hypertension Father    Stroke Father    Diabetes Father    Bell's palsy Father    Cancer Brother        leukemia   Early death Brother    Arthritis Maternal Grandmother    Depression Maternal Grandmother    Diabetes Maternal Grandmother    Heart disease Maternal Grandmother        CHF   Miscarriages / Stillbirths Maternal Grandmother    Vision loss Maternal Grandmother    Alcohol abuse Maternal Grandfather    Heart attack Maternal Grandfather    Arthritis Paternal Grandmother    Heart disease Paternal Grandmother        massive heart attack   Miscarriages / Stillbirths Paternal Grandmother    Alcohol abuse Paternal Grandfather    Stroke Paternal Grandfather    Heart disease Paternal Grandfather        arteriosclerosis       Review of Systems  All other systems reviewed and are negative.     Objective:   Physical Exam Vitals reviewed.  Constitutional:      General: [name] not in acute distress.    Appearance: Normal appearance. [name] obese. [name] not ill-appearing, toxic-appearing or diaphoretic.  HENT:     Head: Normocephalic and atraumatic.     Nose: No congestion or rhinorrhea.  Eyes:     Extraocular Movements: Extraocular movements intact.      Conjunctiva/sclera: Conjunctivae normal.     Pupils: Pupils are equal, round, and reactive to light.  Neck:     Vascular: No carotid bruit.  Cardiovascular:     Rate and Rhythm: Normal rate and regular rhythm.     Pulses: Normal pulses.     Heart sounds: Normal heart sounds. No murmur heard.   No friction rub. No gallop.  Pulmonary:     Effort: Pulmonary effort is normal. No respiratory distress.     Breath sounds: Normal breath sounds. No stridor. No wheezing, rhonchi or rales.  Chest:     Chest wall: No tenderness.  Musculoskeletal:     Cervical back: No tenderness.  Neurological:     General: No focal deficit present.     Mental Status: [name] alert and oriented to person, place, and time. Mental status is at baseline.     Cranial Nerves: No cranial nerve deficit.     Sensory: No sensory deficit.     Motor: No weakness.     Coordination: Coordination normal.     Gait: Gait normal.     Deep Tendon Reflexes: Reflexes normal.  Psychiatric:        Mood and Affect: Mood normal.        Behavior: Behavior normal.        Thought Content: Thought content normal.        Judgment: Judgment normal.  Patient is hoarse        Assessment & Plan:  Laryngitis I believe the patient has a viral  upper infection with laryngitis.  Recommended tincture, and anticipate gradual improvement over the next 10 days.  At the present time there is no sign of serious bacterial illness.  Regarding the vertigo.  I have politely recommended patient that she notify her neurologist if amitriptyline is not helping determine if she would benefit from emgality or aimovig.  At the present time I believe that she has some mild age-related cognitive decline.  I believe anxiety may also be playing a role due to her concern about another and her dementia.  At the present time I do not feel that she has signs of Alzheimer's disease or a neurodegenerative condition.

## 2021-11-14 ENCOUNTER — Other Ambulatory Visit: Payer: Self-pay | Admitting: Family Medicine

## 2021-11-22 ENCOUNTER — Telehealth: Payer: Self-pay

## 2021-11-22 ENCOUNTER — Other Ambulatory Visit: Payer: Self-pay | Admitting: Family Medicine

## 2021-11-22 MED ORDER — AZITHROMYCIN 250 MG PO TABS: ORAL_TABLET | ORAL | 0 refills | Status: DC

## 2021-11-22 NOTE — Telephone Encounter (Signed)
LMTRC to advise of rx °

## 2021-11-22 NOTE — Telephone Encounter (Signed)
Pt called and reports her cough is not improved.   [facility] for more information

## 2021-11-22 NOTE — Telephone Encounter (Signed)
Spoke with pt and advised. Nothing further needed at this time.  ° °

## 2021-11-22 NOTE — Telephone Encounter (Signed)
Spoke with pt and she has continued cough. She denies fever/chills or shob. She has been taking Dayquil/Nyquil with no improvement. She has not taken any other OTC meds. [name] asking for rxs to be called in for her symptoms.  Please advise, thanks!

## 2021-11-23 ENCOUNTER — Other Ambulatory Visit: Payer: Self-pay

## 2021-11-23 DIAGNOSIS — R42 Dizziness and giddiness: Secondary | ICD-10-CM

## 2021-11-23 MED ORDER — MECLIZINE HCL 25 MG PO TABS: ORAL_TABLET | ORAL | 3 refills | Status: DC

## 2021-11-25 ENCOUNTER — Encounter: Payer: Self-pay | Admitting: Psychiatry

## 2021-11-25 DIAGNOSIS — R42 Dizziness and giddiness: Secondary | ICD-10-CM

## 2021-11-26 ENCOUNTER — Encounter: Payer: Self-pay | Admitting: Family Medicine

## 2021-11-26 MED ORDER — AJOVY 225 MG/1.5ML ~~LOC~~ SOAJ: 225.0000 mg | SUBCUTANEOUS | 2 refills | Status: DC

## 2021-11-27 ENCOUNTER — Telehealth: Payer: Self-pay | Admitting: *Deleted

## 2021-11-27 NOTE — Telephone Encounter (Signed)
MD has reviewed and signed for Ajovy PA. Fax was sent to # [phone], confirmation has been received.

## 2021-11-27 NOTE — Telephone Encounter (Signed)
Received fax from [employer] re: Ajovy PA. Form filled out,, placed on MD desk for review, signature.

## 2021-11-30 ENCOUNTER — Other Ambulatory Visit: Payer: Self-pay | Admitting: Family Medicine

## 2021-11-30 DIAGNOSIS — R42 Dizziness and giddiness: Secondary | ICD-10-CM

## 2021-12-06 ENCOUNTER — Other Ambulatory Visit: Payer: Self-pay | Admitting: Family Medicine

## 2021-12-06 ENCOUNTER — Other Ambulatory Visit: Payer: Self-pay

## 2021-12-06 MED ORDER — BUTALBITAL-APAP-CAFFEINE 50-300-40 MG PO CAPS: 1.0000 | ORAL_CAPSULE | Freq: Four times a day (QID) | ORAL | 0 refills | Status: DC | PRN

## 2021-12-07 ENCOUNTER — Other Ambulatory Visit: Payer: Self-pay

## 2021-12-07 DIAGNOSIS — R42 Dizziness and giddiness: Secondary | ICD-10-CM

## 2021-12-07 MED ORDER — MECLIZINE HCL 25 MG PO TABS: ORAL_TABLET | ORAL | 3 refills | Status: DC

## 2021-12-25 ENCOUNTER — Encounter (HOSPITAL_COMMUNITY): Payer: Self-pay | Admitting: Physical Therapy

## 2021-12-25 ENCOUNTER — Other Ambulatory Visit: Payer: Self-pay

## 2021-12-25 ENCOUNTER — Ambulatory Visit (HOSPITAL_COMMUNITY): Payer: Medicare Other | Attending: Psychiatry | Admitting: Physical Therapy

## 2021-12-25 DIAGNOSIS — R42 Dizziness and giddiness: Secondary | ICD-10-CM | POA: Diagnosis not present

## 2021-12-25 DIAGNOSIS — R2689 Other abnormalities of gait and mobility: Secondary | ICD-10-CM | POA: Insufficient documentation

## 2021-12-25 NOTE — Patient Instructions (Signed)
Access Code: [email or web address] URL: [email or web address] Date: 12/25/2021 Prepared by: Georges Lynch  Exercises Seated Head Nods Vestibular Habituation - 3 x daily - 7 x weekly - 2-3 sets - 10 reps Vestibular Habituation - Seated Horizontal Head Rotation - 3 x daily - 7 x weekly - 2-3 sets - 10 reps

## 2021-12-25 NOTE — Therapy (Signed)
[place], [place], [address] Phone: [phone]   Fax:  [phone]  Physical Therapy Evaluation  Patient Details  Name: MAVEL ARRINGTON MRN: [id number] Date of Birth: 07-20-1956 Referring Provider (PT): Genia Harold MD   Encounter Date: 12/25/2021   PT End of Session - 12/25/21 1633     Visit Number 1    Number of Visits 8    Date for PT Re-Evaluation 01/22/22    Authorization Type Medicare A    PT Start Time 1558    PT Stop Time 1645    PT Time Calculation (min) 47 min    Activity Tolerance Patient tolerated treatment well    Behavior During Therapy [facility] for tasks assessed/performed             Past Medical History:  Diagnosis Date   AC (acromioclavicular) joint bone spurs, right    Allergy    seasonal   Anxiety    Asthma    asthmatic bronchitis   Bipolar 1 disorder, depressed ([place])    Candida infection    Cataract 2007   Depression    Genital herpes    GERD (gastroesophageal reflux disease)    Headache    Herpes simplex type 2 infection    High cholesterol    Hyperlipidemia    Hypertension    Plantar fasciitis of right foot    Prediabetes    Rotator cuff tear    right shoulder    Past Surgical History:  Procedure Laterality Date   CATARACT EXTRACTION, BILATERAL     CHOLECYSTECTOMY  1987   KNEE SURGERY Right     There were no vitals filed for this visit.    Subjective Assessment - 12/25/21 1604     Subjective Patient presents with vertigo of unknown origin. She has had recent migraines, nausea and vertigo. She went to hospital and had imaging which was negative for CVA, she was given medications and referred for vestibular rehab. She says this all started in September when she was driving to [place]. Felt worse days after with accompanying symptoms of dizziness.    Limitations House hold activities;Walking;Sitting    Patient Stated Goals Get rid of dizziness                 [facility] PT Assessment - 12/25/21 0001       Assessment   Medical Diagnosis Vertigo    Referring Provider (PT) Genia Harold MD    Prior Therapy Yes in hospital      Precautions   Precautions Fall      Restrictions   Weight Bearing Restrictions No      Balance Screen   Has the patient fallen in the past 6 months Yes    How many times? 2    Has the patient had a decrease in activity level because of a fear of falling?  No    Is the patient reluctant to leave their home because of a fear of falling?  No      Prior Function   Level of Independence Independent      Cognition   Overall Cognitive Status Within Functional Limits for tasks assessed      Observation/Other Assessments   Focus on Therapeutic Outcomes (FOTO)  48% function      ROM / Strength   AROM / PROM / Strength AROM      AROM   AROM Assessment Site Cervical    Cervical  Flexion WFL    Cervical Extension min/mod restriction    Cervical - Right Rotation WFL    Cervical - Left Rotation [facility]      Balance   Balance Assessed Yes      Static Standing Balance   Static Standing Balance -  Activities  Tandam Stance - Right Leg;Tandam Stance - Left Leg    Static Standing - Comment/# of Minutes <3 sec each                    Vestibular Assessment - 12/25/21 0001       Symptom Behavior   Type of Dizziness  Lightheadedness;Vertigo   "woozy"   Frequency of Dizziness Daily    Duration of Dizziness all day    Symptom Nature Variable    Relieving Factors Medication;Closing eyes;Lying supine      Oculomotor Exam   Oculomotor Alignment Normal    Ocular ROM WFL but with stated deficits viewing in peripheral    Smooth Pursuits Intact      Positional Sensitivities   Sit to Supine Moderate dizziness    Supine to Left Side Moderate dizziness    Supine to Right Side Mild dizziness    Supine to Sitting Moderate dizziness    Head Turning x 5 Moderate dizziness    Head Nodding x 5 Moderate dizziness     Rolling Right Mild dizziness    Rolling Left Moderate dizziness                Objective measurements completed on examination: See above findings.                PT Education - 12/25/21 1604     Education Details on evalution findings, POC and HEP    Person(s) Educated Patient    Methods Explanation;Handout    Comprehension Verbalized understanding              PT Short Term Goals - 12/25/21 1638       PT SHORT TERM GOAL #1   Title Patient will be independent with initial HEP and self-management strategies to improve functional outcomes    Time 2    Period Weeks    Status New    Target Date 01/08/22               PT Long Term Goals - 12/25/21 1643       PT LONG TERM GOAL #1   Title Patient will improve FOTO score to predicted value to indicate improvement in functional outcomes    Time 4    Period Weeks    Status New    Target Date 01/22/22      PT LONG TERM GOAL #2   Title Patient will be independent with advanced HEP and self-management strategies to improve functional outcomes    Time 4    Period Weeks    Status New    Target Date 01/22/22      PT LONG TERM GOAL #3   Title Patient will be able to maintain tandem stance >20 seconds on BLEs to improve stability and reduce risk for falls    Time 4    Period Weeks    Status New    Target Date 01/22/22      PT LONG TERM GOAL #4   Title Patient will report at least 50% overall improvement in subjective complaint of dizziness to indicate improvement in ability to perform ADLs.    Time 4  Period Weeks    Status New    Target Date 01/22/22                    Plan - 12/25/21 1635     Clinical Impression Statement Patient is a 66 y.o. female who presents to physical therapy with complaint of vertigo. Patient demonstrates increased dizziness with nearly all provocative testing, poor balance and gait abnormalities which are negatively impacting patient ability to perform  ADLs and functional mobility tasks. Patient will benefit from skilled physical therapy services to address these deficits to improve level of function with ADLs, functional mobility tasks, and reduce risk for falls.    Examination-Activity Limitations Locomotion Level;Stand;Transfers    Examination-Participation Restrictions Community Activity;Cleaning;Shop;Laundry    Stability/Clinical Decision Making Evolving/Moderate complexity    Clinical Decision Making Moderate    Rehab Potential Fair    PT Frequency 2x / week    PT Duration 4 weeks    PT Treatment/Interventions ADLs/Self Care Home Management;Biofeedback;Traction;Moist Heat;Stair training;Functional mobility training;Ultrasound;Canalith Repostioning;Parrafin;Therapeutic activities;Fluidtherapy;Cryotherapy;Electrical Stimulation;Contrast Bath;Therapeutic exercise;Orthotic Fit/Training;Patient/family education;Compression bandaging;Splinting;Taping;Vasopneumatic Device;Joint Manipulations;Spinal Manipulations;Energy conservation;Manual techniques;Dry needling;Passive range of motion;Visual/perceptual remediation/compensation;Vestibular;Scar mobilization;Neuromuscular re-education;Balance training;DME Instruction;Gait training;Iontophoresis 4mg /ml Dexamethasone    PT Next Visit Plan Review HEP. Progress vestibular habituation exercises, posture and balance activity as able.    PT Home Exercise Plan Eval: seated head nods and turns    Consulted and Agree with Plan of Care Patient             Patient will benefit from skilled therapeutic intervention in order to improve the following deficits and impairments:  Abnormal gait, Dizziness, Decreased mobility, Decreased activity tolerance, Decreased balance  Visit Diagnosis: Dizziness and giddiness  Other abnormalities of gait and mobility     Problem List Patient Active Problem List   Diagnosis Date Noted   Right knee pain 10/18/2021   Obesity (BMI 30-39.9) 08/23/2021   Migraine  05/21/2018   Low back pain without sciatica 07/01/2017   Urinary tract infection without hematuria 07/01/2017   Vaginal dryness 07/01/2017   Urinary frequency 07/01/2017   Bipolar 1 disorder, mixed, moderate (HCC) 08/28/2016   Insomnia 12/18/2015   Headache 12/18/2015   Vertigo 12/18/2015   Allergy    Anxiety    Asthma    Depression    Prediabetes    GERD (gastroesophageal reflux disease)    Hyperlipidemia    Hypertension    Herpes simplex type 2 infection    SHOULDER PAIN 04/24/2009   IMPINGEMENT SYNDROME 04/24/2009   4:50 PM, 12/25/21 Josue Hector PT DPT  Physical Therapist with [facility]  [phone] [place] [address] [place], [place], [address] Phone: [phone]   Fax:  [phone]  Name: LISSANDRA GRADOWSKI MRN: [id number] Date of Birth: 06/10/1956

## 2021-12-28 ENCOUNTER — Ambulatory Visit (HOSPITAL_COMMUNITY): Payer: Medicare Other

## 2021-12-28 ENCOUNTER — Encounter: Payer: Self-pay | Admitting: Family Medicine

## 2021-12-28 ENCOUNTER — Other Ambulatory Visit: Payer: Self-pay

## 2021-12-28 DIAGNOSIS — R2689 Other abnormalities of gait and mobility: Secondary | ICD-10-CM

## 2021-12-28 DIAGNOSIS — R42 Dizziness and giddiness: Secondary | ICD-10-CM | POA: Diagnosis not present

## 2021-12-28 NOTE — Therapy (Signed)
[place], [place], [address] Phone: [phone]   Fax:  [phone]  Physical Therapy Treatment  Patient Details  Name: Peggy Fitzgerald MRN: [id number] Date of Birth: 21-Mar-1956 Referring Provider (PT): Genia Harold MD   Encounter Date: 12/28/2021   PT End of Session - 12/28/21 1349     Visit Number 2    Number of Visits 8    Date for PT Re-Evaluation 01/22/22    Authorization Type Medicare A    PT Start Time 1348    PT Stop Time 1430    PT Time Calculation (min) 42 min    Activity Tolerance Patient tolerated treatment well    Behavior During Therapy [facility] for tasks assessed/performed             Past Medical History:  Diagnosis Date   AC (acromioclavicular) joint bone spurs, right    Allergy    seasonal   Anxiety    Asthma    asthmatic bronchitis   Bipolar 1 disorder, depressed ([address])    Candida infection    Cataract 2007   Depression    Genital herpes    GERD (gastroesophageal reflux disease)    Headache    Herpes simplex type 2 infection    High cholesterol    Hyperlipidemia    Hypertension    Plantar fasciitis of right foot    Prediabetes    Rotator cuff tear    right shoulder    Past Surgical History:  Procedure Laterality Date   CATARACT EXTRACTION, BILATERAL     CHOLECYSTECTOMY  1987   KNEE SURGERY Right     There were no vitals filed for this visit.   Subjective Assessment - 12/28/21 1353     Subjective Not feeling too woozy today. One episode when standing up from bed. Did not take HTN medicine today. BP assessed at start of session and reveals 138/108 mmHg, 80 bpm. Notes small migraine today and has not taken Meclazine    Limitations House hold activities;Walking;Sitting    Patient Stated Goals Get rid of dizziness                     Vestibular Assessment - 12/28/21 0001       Symptom Behavior   Type of Dizziness  Lightheadedness;Vertigo    Symptom  Nature Variable      Orthostatics   BP sitting 138/108    HR sitting 80                       Vestibular Treatment/Exercise - 12/28/21 0001       Vestibular Treatment/Exercise   Vestibular Treatment Provided Habituation;Gaze    Habituation Exercises Seated Horizontal Head Turns;Seated Vertical Head Turns    Gaze Exercises X1 Viewing Horizontal;X1 Viewing Vertical      Seated Horizontal Head Turns   Number of Reps  5    Symptom Description  no issue      Seated Vertical Head Turns   Number of Reps  10    Symptom Description  some symptonms with looking up      X1 Viewing Horizontal   Foot Position seated, feet on floor    Reps 20      X1 Viewing Vertical   Foot Position seated, feet on floor    Reps 20  PT Short Term Goals - 12/25/21 1638       PT SHORT TERM GOAL #1   Title Patient will be independent with initial HEP and self-management strategies to improve functional outcomes    Time 2    Period Weeks    Status New    Target Date 01/08/22               PT Long Term Goals - 12/25/21 1643       PT LONG TERM GOAL #1   Title Patient will improve FOTO score to predicted value to indicate improvement in functional outcomes    Time 4    Period Weeks    Status New    Target Date 01/22/22      PT LONG TERM GOAL #2   Title Patient will be independent with advanced HEP and self-management strategies to improve functional outcomes    Time 4    Period Weeks    Status New    Target Date 01/22/22      PT LONG TERM GOAL #3   Title Patient will be able to maintain tandem stance >20 seconds on BLEs to improve stability and reduce risk for falls    Time 4    Period Weeks    Status New    Target Date 01/22/22      PT LONG TERM GOAL #4   Title Patient will report at least 50% overall improvement in subjective complaint of dizziness to indicate improvement in ability to perform ADLs.    Time 4    Period Weeks     Status New    Target Date 01/22/22                   Plan - 12/28/21 1433     Clinical Impression Statement Notes feeling some improvement since initiating head movement activities. Continues to experience feeling of lightheaded/woozy when arising from sit to stand. During assessment and tx session demonstrates increased difficulty and some onset of symptoms with head/neck extension or looking up. Today's session focus being gaze stabilization with post-it note on wall for target. Main symptom provocation brought on with head/neck extension whilst maintaining gaze on target.    Examination-Activity Limitations Locomotion Level;Stand;Transfers    Examination-Participation Restrictions Community Activity;Cleaning;Shop;Laundry    Stability/Clinical Decision Making Evolving/Moderate complexity    Rehab Potential Fair    PT Frequency 2x / week    PT Duration 4 weeks    PT Treatment/Interventions ADLs/Self Care Home Management;Biofeedback;Traction;Moist Heat;Stair training;Functional mobility training;Ultrasound;Canalith Repostioning;Parrafin;Therapeutic activities;Fluidtherapy;Cryotherapy;Electrical Stimulation;Contrast Bath;Therapeutic exercise;Orthotic Fit/Training;Patient/family education;Compression bandaging;Splinting;Taping;Vasopneumatic Device;Joint Manipulations;Spinal Manipulations;Energy conservation;Manual techniques;Dry needling;Passive range of motion;Visual/perceptual remediation/compensation;Vestibular;Scar mobilization;Neuromuscular re-education;Balance training;DME Instruction;Gait training;Iontophoresis 4mg /ml Dexamethasone    PT Next Visit Plan Review HEP. Progress vestibular habituation exercises, posture and balance activity as able.    PT Home Exercise Plan Eval: seated head nods and turns. Seated gaze stabilization with head turns/nods    Consulted and Agree with Plan of Care Patient             Patient will benefit from skilled therapeutic intervention in order to  improve the following deficits and impairments:  Abnormal gait, Dizziness, Decreased mobility, Decreased activity tolerance, Decreased balance  Visit Diagnosis: Dizziness and giddiness  Other abnormalities of gait and mobility     Problem List Patient Active Problem List   Diagnosis Date Noted   Right knee pain 10/18/2021   Obesity (BMI 30-39.9) 08/23/2021   Migraine 05/21/2018   Low back pain without sciatica  07/01/2017   Urinary tract infection without hematuria 07/01/2017   Vaginal dryness 07/01/2017   Urinary frequency 07/01/2017   Bipolar 1 disorder, mixed, moderate (HCC) 08/28/2016   Insomnia 12/18/2015   Headache 12/18/2015   Vertigo 12/18/2015   Allergy    Anxiety    Asthma    Depression    Prediabetes    GERD (gastroesophageal reflux disease)    Hyperlipidemia    Hypertension    Herpes simplex type 2 infection    SHOULDER PAIN 04/24/2009   IMPINGEMENT SYNDROME 04/24/2009    Toniann Fail, PT 12/28/2021, 2:37 PM  [facility] [address] [place], [place], [address] Phone: [phone]   Fax:  [phone]  Name: YISELA HAMMERSTROM MRN: [id number] Date of Birth: [birth year]

## 2022-01-01 ENCOUNTER — Ambulatory Visit (HOSPITAL_COMMUNITY): Payer: Medicare Other | Admitting: Physical Therapy

## 2022-01-01 ENCOUNTER — Other Ambulatory Visit: Payer: Self-pay

## 2022-01-01 DIAGNOSIS — R2689 Other abnormalities of gait and mobility: Secondary | ICD-10-CM

## 2022-01-01 DIAGNOSIS — R42 Dizziness and giddiness: Secondary | ICD-10-CM | POA: Diagnosis not present

## 2022-01-01 NOTE — Therapy (Signed)
[place], [place], [address] Phone: [phone]   Fax:  [phone]  Physical Therapy Treatment  Patient Details  Name: Peggy Fitzgerald MRN: [id number] Date of Birth: Mar 09, 1956 Referring Provider (PT): Genia Harold MD   Encounter Date: 01/01/2022   PT End of Session - 01/01/22 1428     Visit Number 3    Number of Visits 8    Date for PT Re-Evaluation 01/22/22    Authorization Type Medicare A    PT Start Time 1400    PT Stop Time 1440    PT Time Calculation (min) 40 min    Activity Tolerance Patient tolerated treatment well    Behavior During Therapy [facility] for tasks assessed/performed             Past Medical History:  Diagnosis Date   AC (acromioclavicular) joint bone spurs, right    Allergy    seasonal   Anxiety    Asthma    asthmatic bronchitis   Bipolar 1 disorder, depressed ([place])    Candida infection    Cataract 2007   Depression    Genital herpes    GERD (gastroesophageal reflux disease)    Headache    Herpes simplex type 2 infection    High cholesterol    Hyperlipidemia    Hypertension    Plantar fasciitis of right foot    Prediabetes    Rotator cuff tear    right shoulder    Past Surgical History:  Procedure Laterality Date   CATARACT EXTRACTION, BILATERAL     CHOLECYSTECTOMY  1987   KNEE SURGERY Right     There were no vitals filed for this visit.   Subjective Assessment - 01/01/22 1401     Subjective Pt states that she feels cross eyed today.  She has not been able to do the exercises today because she is very woozy.  She does feel like she has been doing better.                     Vestibular Assessment - 01/01/22 0001       Positional Testing   Dix-Hallpike --   PT resists going down unable to complete.                     [place] Adult PT Treatment/Exercise - 01/01/22 0001       Exercises   Exercises Neck      Neck Exercises:  Theraband   Scapula Retraction 10 reps   siting tall with cervical retraction            Vestibular Treatment/Exercise - 01/01/22 0001       Vestibular Treatment/Exercise   Vestibular Treatment Provided Habituation;Gaze    Habituation Exercises Standing Horizontal Head Turns;Standing Vertical Head Turns    Gaze Exercises X1 Viewing Horizontal;X1 Viewing Vertical;X2 Viewing Horizontal      Seated Horizontal Head Turns   Number of Reps  10      Standing Horizontal Head Turns   Number of Reps  5   x 2 reps   Symptom Description  moderate      Standing Vertical Head Turns   Number of Reps  5   x2   Symptom Description  mild to moderate      X2 Viewing Horizontal   Foot Position floor    Reps 4  Balance Exercises - 01/01/22 0001       Balance Exercises: Standing   SLS Eyes open;3 reps    Marching 10 reps   one hand hold   Other Standing Exercises hip excursion x 3 ;                  PT Short Term Goals - 01/01/22 1443       PT SHORT TERM GOAL #1   Title Patient will be independent with initial HEP and self-management strategies to improve functional outcomes    Time 2    Period Weeks    Status Achieved    Target Date 01/08/22               PT Long Term Goals - 01/01/22 1443       PT LONG TERM GOAL #1   Title Patient will improve FOTO score to predicted value to indicate improvement in functional outcomes    Time 4    Period Weeks    Status On-going    Target Date 01/22/22      PT LONG TERM GOAL #2   Title Patient will be independent with advanced HEP and self-management strategies to improve functional outcomes    Time 4    Period Weeks    Status On-going    Target Date 01/22/22      PT LONG TERM GOAL #3   Title Patient will be able to maintain tandem stance >20 seconds on BLEs to improve stability and reduce risk for falls    Time 4    Period Weeks    Status On-going    Target Date 01/22/22      PT LONG TERM  GOAL #4   Title Patient will report at least 50% overall improvement in subjective complaint of dizziness to indicate improvement in ability to perform ADLs.    Time 4    Period Weeks    Status On-going    Target Date 01/22/22                   Plan - 01/01/22 1428     Clinical Impression Statement Pt continues to feel that she is improving.  Added balance activity today as well as advancing HEP.    Examination-Activity Limitations Locomotion Level;Stand;Transfers    Examination-Participation Restrictions Community Activity;Cleaning;Shop;Laundry    Stability/Clinical Decision Making Evolving/Moderate complexity    Rehab Potential Fair    PT Frequency 2x / week    PT Duration 4 weeks    PT Treatment/Interventions ADLs/Self Care Home Management;Biofeedback;Traction;Moist Heat;Stair training;Functional mobility training;Ultrasound;Canalith Repostioning;Parrafin;Therapeutic activities;Fluidtherapy;Cryotherapy;Electrical Stimulation;Contrast Bath;Therapeutic exercise;Orthotic Fit/Training;Patient/family education;Compression bandaging;Splinting;Taping;Vasopneumatic Device;Joint Manipulations;Spinal Manipulations;Energy conservation;Manual techniques;Dry needling;Passive range of motion;Visual/perceptual remediation/compensation;Vestibular;Scar mobilization;Neuromuscular re-education;Balance training;DME Instruction;Gait training;Iontophoresis 4mg /ml Dexamethasone    PT Next Visit Plan Review HEP. Progress vestibular habituation exercises, posture and balance activity as able.; 1/24:DNP3TYJ4; standing head turns and nods as well as combo of cervical/scapular retraction.    PT Home Exercise Plan Eval: seated head nods and turns. Seated gaze stabilization with head turns/nods    Consulted and Agree with Plan of Care Patient             Patient will benefit from skilled therapeutic intervention in order to improve the following deficits and impairments:  Abnormal gait, Dizziness,  Decreased mobility, Decreased activity tolerance, Decreased balance  Visit Diagnosis: Dizziness and giddiness  Other abnormalities of gait and mobility     Problem List Patient Active Problem List   Diagnosis Date Noted  Right knee pain 10/18/2021   Obesity (BMI 30-39.9) 08/23/2021   Migraine 05/21/2018   Low back pain without sciatica 07/01/2017   Urinary tract infection without hematuria 07/01/2017   Vaginal dryness 07/01/2017   Urinary frequency 07/01/2017   Bipolar 1 disorder, mixed, moderate (HCC) 08/28/2016   Insomnia 12/18/2015   Headache 12/18/2015   Vertigo 12/18/2015   Allergy    Anxiety    Asthma    Depression    Prediabetes    GERD (gastroesophageal reflux disease)    Hyperlipidemia    Hypertension    Herpes simplex type 2 infection    SHOULDER PAIN 04/24/2009   IMPINGEMENT SYNDROME 04/24/2009   Rayetta Humphrey, PT CLT [phone]  01/01/2022, 2:44 PM  [facility] [address] [place], [place], [address] Phone: [phone]   Fax:  [phone]  Name: Peggy Fitzgerald MRN: [id number] Date of Birth: 1956/02/02

## 2022-01-04 ENCOUNTER — Encounter (HOSPITAL_COMMUNITY): Payer: TRICARE For Life (TFL) | Admitting: Physical Therapy

## 2022-01-08 DIAGNOSIS — F411 Generalized anxiety disorder: Secondary | ICD-10-CM | POA: Diagnosis not present

## 2022-01-08 DIAGNOSIS — F319 Bipolar disorder, unspecified: Secondary | ICD-10-CM | POA: Diagnosis not present

## 2022-01-10 ENCOUNTER — Ambulatory Visit (INDEPENDENT_AMBULATORY_CARE_PROVIDER_SITE_OTHER): Payer: Medicare Other | Admitting: Psychiatry

## 2022-01-10 ENCOUNTER — Encounter: Payer: Self-pay | Admitting: Psychiatry

## 2022-01-10 ENCOUNTER — Other Ambulatory Visit: Payer: Self-pay

## 2022-01-10 VITALS — BP 152/92 | HR 59 | Ht 66.0 in | Wt 190.0 lb

## 2022-01-10 DIAGNOSIS — G43019 Migraine without aura, intractable, without status migrainosus: Secondary | ICD-10-CM | POA: Diagnosis not present

## 2022-01-10 MED ORDER — AJOVY 225 MG/1.5ML ~~LOC~~ SOAJ: 225.0000 mg | SUBCUTANEOUS | 11 refills | Status: DC

## 2022-01-10 MED ORDER — RIZATRIPTAN BENZOATE 10 MG PO TABS: 10.0000 mg | ORAL_TABLET | ORAL | 3 refills | Status: DC | PRN

## 2022-01-10 NOTE — Progress Notes (Signed)
° °  CC:  headaches  Follow-up Visit  Last visit: 10/04/21  Brief HPI: 66 year old female with a history of bipolar disorder who follows in clinic for migraines.  At her last visit she was continued on amitriptyline for prevention.   Interval History: She continued to have migraines with amitriptyline so she was started on Ajovy for headache prevention. Has had 2 injections so far. She feels her migraines have been better lately. They are now intermittent and less severe. She will use Fioricet 1-2 times per week. She does have a tooth abscess which has been triggering migraines recently  Vertigo has improved with physical therapy. [name] still anxious about driving.  Current Headache Regimen: Preventative: Ajovy Abortive: Tylenol, Fioricet  Prior Therapies                                  Amitriptyline 150 mg QHS Fioricet Lamictal 100/50 Metoprolol 100 mg daily Topamax - lack of efficacy Zofran robaxin Meclizine Imitrex Migranal  Physical Exam:   Vital Signs: BP (!) 152/92    Pulse (!) 59    Ht 5\' 6"  (1.676 m)    Wt 190 lb (86.2 kg)    BMI 30.67 kg/m  GENERAL:  well appearing, in no acute distress, alert  SKIN:  Color, texture, turgor normal. No rashes or lesions HEAD:  Normocephalic/atraumatic. RESP: normal respiratory effort MSK:  No gross joint deformities.   NEUROLOGICAL: Mental Status: Alert, oriented to person, place and time, Follows commands, and Speech fluent and appropriate. Cranial Nerves: PERRL, face symmetric, no dysarthria, hearing grossly intact Motor: moves all extremities equally Gait: normal-based.  IMPRESSION: 66 year old female with a history of bipolar disorder who presents for follow up of migraines. She has had improvement in migraine frequency and severity with Ajovy. Currently she is taking Fioricet for rescue. Will try switching to Maxalt and see if this is more effective. She will continue to attend vestibular therapy for  vertigo.  PLAN: -Prevention: Continue Ajovy 225 mg monthly -Rescue: Start Maxalt 10 mg PRN -Continue vestibular therapy -Next steps: consider qulipta, Botox   Follow-up: 3 months  I spent a total of 24 minutes on the date of the service. Discussed medication overuse headache and to limit use of acute treatments to no more than 2 days/week or 10 days/month. Discussed medication side effects, adverse reactions and drug interactions. Written educational materials and patient instructions outlining all of the above were given.  79, MD 01/10/22 2:55 PM

## 2022-01-10 NOTE — Patient Instructions (Addendum)
Start Maxalt as needed for migraines. Take at the onset of migraine. If headache recurs or does not fully resolve, you may take a second dose after 2 hours. Please avoid taking more than 2 days per week to avoid rebound headaches Continue Ajovy for now Continue vestibular therapy

## 2022-01-15 ENCOUNTER — Ambulatory Visit (HOSPITAL_COMMUNITY): Payer: Medicare Other | Admitting: Physical Therapy

## 2022-01-17 ENCOUNTER — Ambulatory Visit (HOSPITAL_COMMUNITY): Payer: Medicare Other | Attending: Psychiatry | Admitting: Physical Therapy

## 2022-01-17 ENCOUNTER — Other Ambulatory Visit: Payer: Self-pay

## 2022-01-17 DIAGNOSIS — R2689 Other abnormalities of gait and mobility: Secondary | ICD-10-CM | POA: Insufficient documentation

## 2022-01-17 DIAGNOSIS — R42 Dizziness and giddiness: Secondary | ICD-10-CM | POA: Insufficient documentation

## 2022-01-17 NOTE — Therapy (Signed)
[place], [place], [address] Phone: [phone]   Fax:  [phone]  Physical Therapy Treatment  Patient Details  Name: Peggy Fitzgerald MRN: [id number] Date of Birth: Apr 20, 1956 Referring Provider (PT): Genia Harold MD   Encounter Date: 01/17/2022   PT End of Session - 01/17/22 1443     Visit Number 4    Number of Visits 8    Date for PT Re-Evaluation 01/22/22    Authorization Type Medicare A    PT Start Time 1445    PT Stop Time 1525    PT Time Calculation (min) 40 min    Activity Tolerance Patient tolerated treatment well    Behavior During Therapy [facility] for tasks assessed/performed             Past Medical History:  Diagnosis Date   AC (acromioclavicular) joint bone spurs, right    Allergy    seasonal   Anxiety    Asthma    asthmatic bronchitis   Bipolar 1 disorder, depressed ([place])    Candida infection    Cataract 2007   Depression    Genital herpes    GERD (gastroesophageal reflux disease)    Headache    Herpes simplex type 2 infection    High cholesterol    Hyperlipidemia    Hypertension    Plantar fasciitis of right foot    Prediabetes    Rotator cuff tear    right shoulder    Past Surgical History:  Procedure Laterality Date   CATARACT EXTRACTION, BILATERAL     CHOLECYSTECTOMY  1987   KNEE SURGERY Right     There were no vitals filed for this visit.   Subjective Assessment - 01/17/22 1445     Subjective Pt states that she was able to go for three days without medication but needed some today.  No pain    Patient Stated Goals Get rid of dizziness                                Vestibular Treatment/Exercise - 01/17/22 0001       Vestibular Treatment/Exercise   Habituation Exercises Standing Vertical Head Turns;Standing Horizontal Head Turns;180 degree Turns      Standing Horizontal Head Turns   Number of Reps  10    Symptom Description  in  front of busy picture      Standing Vertical Head Turns   Number of Reps  10    Symptom Description  in front of busy background      180 degree Turns   Number of Reps  5    Symptom Description  unsteady                Balance Exercises - 01/17/22 0001       Balance Exercises: Standing   Tandem Stance Eyes open;Foam/compliant surface;5 reps   head turns   SLS Eyes open;3 reps    Retro Gait 2 reps    Marching 10 reps   repeat with raising oppostite arm.   Sit to Stand Standard surface   10   Other Standing Exercises back against wall scapular retraction, cervical retraction x 10                  PT Short Term Goals - 01/17/22 1532       PT SHORT TERM GOAL #1   Title  Patient will be independent with initial HEP and self-management strategies to improve functional outcomes    Time 2    Period Weeks    Status Achieved    Target Date 01/08/22               PT Long Term Goals - 01/17/22 1532       PT LONG TERM GOAL #1   Title Patient will improve FOTO score to predicted value to indicate improvement in functional outcomes    Time 4    Period Weeks    Status On-going    Target Date 01/22/22      PT LONG TERM GOAL #2   Title Patient will be independent with advanced HEP and self-management strategies to improve functional outcomes    Time 4    Period Weeks    Status On-going    Target Date 01/22/22      PT LONG TERM GOAL #3   Title Patient will be able to maintain tandem stance >20 seconds on BLEs to improve stability and reduce risk for falls    Time 4    Period Weeks    Status On-going    Target Date 01/22/22      PT LONG TERM GOAL #4   Title Patient will report at least 50% overall improvement in subjective complaint of dizziness to indicate improvement in ability to perform ADLs.    Time 4    Period Weeks    Status On-going    Target Date 01/22/22                   Plan - 01/17/22 1443     Clinical Impression Statement  Continued with balance activity to include foam tandem stance and tandem gait with pt .    Examination-Activity Limitations Locomotion Level;Stand;Transfers    Examination-Participation Restrictions Community Activity;Cleaning;Shop;Laundry    Stability/Clinical Decision Making Evolving/Moderate complexity    Clinical Decision Making Moderate    Rehab Potential Fair    PT Frequency 2x / week    PT Duration 4 weeks    PT Treatment/Interventions ADLs/Self Care Home Management;Biofeedback;Traction;Moist Heat;Stair training;Functional mobility training;Ultrasound;Canalith Repostioning;Parrafin;Therapeutic activities;Fluidtherapy;Cryotherapy;Electrical Stimulation;Contrast Bath;Therapeutic exercise;Orthotic Fit/Training;Patient/family education;Compression bandaging;Splinting;Taping;Vasopneumatic Device;Joint Manipulations;Spinal Manipulations;Energy conservation;Manual techniques;Dry needling;Passive range of motion;Visual/perceptual remediation/compensation;Vestibular;Scar mobilization;Neuromuscular re-education;Balance training;DME Instruction;Gait training;Iontophoresis 4mg /ml Dexamethasone    PT Next Visit Plan . Progress vestibular habituation exercises, posture and balance activity as able.; 1/24:DNP3TYJ4; standing head turns and nods as well as combo of cervical/scapular retraction.             Patient will benefit from skilled therapeutic intervention in order to improve the following deficits and impairments:  Abnormal gait, Dizziness, Decreased mobility, Decreased activity tolerance, Decreased balance  Visit Diagnosis: Dizziness and giddiness  Other abnormalities of gait and mobility     Problem List Patient Active Problem List   Diagnosis Date Noted   Right knee pain 10/18/2021   Obesity (BMI 30-39.9) 08/23/2021   Migraine 05/21/2018   Low back pain without sciatica 07/01/2017   Urinary tract infection without hematuria 07/01/2017   Vaginal dryness 07/01/2017   Urinary  frequency 07/01/2017   Bipolar 1 disorder, mixed, moderate (HCC) 08/28/2016   Insomnia 12/18/2015   Headache 12/18/2015   Vertigo 12/18/2015   Allergy    Anxiety    Asthma    Depression    Prediabetes    GERD (gastroesophageal reflux disease)    Hyperlipidemia    Hypertension    Herpes simplex type 2 infection  SHOULDER PAIN 04/24/2009   IMPINGEMENT SYNDROME 04/24/2009   Rayetta Humphrey, PT CLT [phone]  01/17/2022, 3:32 PM  [facility] [address] [place], [place], [address] Phone: [phone]   Fax:  [phone]  Name: CORA PHAUP MRN: [id number] Date of Birth: [birth year]

## 2022-01-21 ENCOUNTER — Encounter: Payer: Self-pay | Admitting: Psychiatry

## 2022-01-21 MED ORDER — AJOVY 225 MG/1.5ML ~~LOC~~ SOAJ: 225.0000 mg | SUBCUTANEOUS | 2 refills | Status: DC

## 2022-01-22 ENCOUNTER — Encounter (HOSPITAL_COMMUNITY): Payer: TRICARE For Life (TFL) | Admitting: Physical Therapy

## 2022-01-24 ENCOUNTER — Encounter (HOSPITAL_COMMUNITY): Payer: TRICARE For Life (TFL) | Admitting: Physical Therapy

## 2022-01-25 ENCOUNTER — Other Ambulatory Visit: Payer: Self-pay

## 2022-01-25 ENCOUNTER — Ambulatory Visit (INDEPENDENT_AMBULATORY_CARE_PROVIDER_SITE_OTHER): Payer: Medicare Other | Admitting: Family Medicine

## 2022-01-25 ENCOUNTER — Encounter: Payer: Self-pay | Admitting: Family Medicine

## 2022-01-25 VITALS — BP 118/82 | HR 62 | Temp 97.2°F | Resp 18 | Ht 66.0 in | Wt 190.0 lb

## 2022-01-25 DIAGNOSIS — M79604 Pain in right leg: Secondary | ICD-10-CM | POA: Diagnosis not present

## 2022-01-25 MED ORDER — WEGOVY 0.5 MG/0.5ML ~~LOC~~ SOAJ: 0.5000 mg | SUBCUTANEOUS | 1 refills | Status: DC

## 2022-01-25 NOTE — Progress Notes (Signed)
Subjective:    Patient ID: Peggy Fitzgerald, female    DOB: 1956-09-14, 66 y.o.   MRN: [id number]  HPI  2 months ago, the patient fell out of bed and landed awkwardly on her right leg.  She denies any pain in her knee.  She denies any pain in her ankle.  The pain is actually located over the anterior lateral portion of her right calf muscle.  There is no visible bruising.  There is no palpable mass.  There is no pain with ankle plantarflexion or dorsiflexion.  She has no pain with knee flexion or extension.  There is no neuropathy in the leg.  She denies any numbness or tingling.  She denies any burning or stinging radiating down the leg.  She denies any symptoms of claudication.  The pain primarily occurs when she sits still.  Is more of a dull constant ache.  She also request assistance with weight loss and she is interested in trying [facility].  She believes that her insurance will cover it Past Medical History:  Diagnosis Date   AC (acromioclavicular) joint bone spurs, right    Allergy    seasonal   Anxiety    Asthma    asthmatic bronchitis   Bipolar 1 disorder, depressed ([place])    Candida infection    Cataract 2007   Depression    Genital herpes    GERD (gastroesophageal reflux disease)    Headache    Herpes simplex type 2 infection    High cholesterol    Hyperlipidemia    Hypertension    Plantar fasciitis of right foot    Prediabetes    Rotator cuff tear    right shoulder   Past Surgical History:  Procedure Laterality Date   CATARACT EXTRACTION, BILATERAL     CHOLECYSTECTOMY  1987   KNEE SURGERY Right    Current Outpatient Medications on File Prior to Visit  Medication Sig Dispense Refill   acyclovir cream (ZOVIRAX) 5 % APPLY 1 APPLICATION TOPICALLY TWICE A DAY AS NEEDED FOR OUTBREAKS 15 g 3   albuterol (VENTOLIN HFA) 108 (90 Base) MCG/ACT inhaler Inhale 2 puffs into the lungs every 4 (four) hours as needed for wheezing or shortness of breath. 18 g 1   amoxicillin  (AMOXIL) 500 MG capsule Take by mouth.     atorvastatin (LIPITOR) 40 MG tablet TAKE 1 TABLET DAILY 90 tablet 3   Butalbital-APAP-Caffeine 50-300-40 MG CAPS Take 1 capsule by mouth 4 (four) times daily as needed. 30 capsule 0   clobetasol (TEMOVATE) 0.05 % external solution Apply 1 application topically 2 (two) times daily as needed. Apply to scalp as needed for irritation. 150 mL 0   fluticasone (FLONASE) 50 MCG/ACT nasal spray Place 2 sprays into both nostrils daily. 16 g 6   Fremanezumab-vfrm (AJOVY) 225 MG/1.5ML SOAJ Inject 225 mg into the skin every 30 (thirty) days. 5.04 mL 2   hydrOXYzine (ATARAX/VISTARIL) 25 MG tablet Take 1 tablet (25 mg total) by mouth 3 (three) times daily as needed.     hydrOXYzine (VISTARIL) 25 MG capsule Take 25 mg by mouth 3 (three) times daily.     ketorolac (TORADOL) 10 MG tablet Take 1 tablet (10 mg total) by mouth every 8 (eight) hours. 15 tablet 0   lamoTRIgine (LAMICTAL) 25 MG tablet Take 1 tablet (25 mg total) by mouth daily. (Patient taking differently: Take by mouth. Patient taking 100mg  in morning and 50mg  at night) 14 tablet 0   LATUDA  80 MG TABS tablet Take 80 mg by mouth daily.      LORazepam (ATIVAN) 0.5 MG tablet Take 1 tablet (0.5 mg total) by mouth every 8 (eight) hours as needed for anxiety (itching). 30 tablet 0   meclizine (ANTIVERT) 25 MG tablet TAKE 1 TABLET AS NEEDED FOR DIZZINESS 90 tablet 3   methocarbamol (ROBAXIN) 500 MG tablet TAKE 1 TABLET FOUR TIMES A DAY 90 tablet 14   metoprolol succinate (TOPROL-XL) 100 MG 24 hr tablet TAKE 1 TABLET AT BEDTIME WITH OR IMMEDIATELY FOLLOWING A MEAL  (CALL CLINIC TO SCHEDULE APPOINTMENT FOR FUTURE REFILLS. [phone]) 90 tablet 3   montelukast (SINGULAIR) 10 MG tablet Take 1 tablet (10 mg total) by mouth at bedtime. 30 tablet 3   promethazine (PHENERGAN) 25 MG tablet TAKE 1 TABLET EVERY 6 HOURS AS NEEDED FOR NAUSEA OR VOMITING 90 tablet 14   rizatriptan (MAXALT) 10 MG tablet Take 1 tablet (10 mg total)  by mouth as needed for migraine. May repeat in 2 hours if needed 10 tablet 3   valACYclovir (VALTREX) 500 MG tablet TAKE 1 TABLET DAILY 90 tablet 3   zolpidem (AMBIEN) 5 MG tablet Take 5 mg by mouth at bedtime as needed.     No current facility-administered medications on file prior to visit.     Allergies  Allergen Reactions   Milk-Related Compounds Diarrhea    Cannot drink plain milk, but can eat ice cream, and milk in cereal   Oxycodone-Acetaminophen Itching    Nightmares   Social History   Socioeconomic History   Marital status: Married    Spouse name: Homero Fellers   Number of children: 3   Years of education: Not on file   Highest education level: Not on file  Occupational History   Not on file  Tobacco Use   Smoking status: Former    Years: 0.50    Types: Cigarettes   Smokeless tobacco: Never   Tobacco comments:    Quit many years ago  Vaping Use   Vaping Use: Never used  Substance and Sexual Activity   Alcohol use: No   Drug use: No   Sexual activity: Not on file    Comment: spouse vasectomy  Other Topics Concern   Not on file  Social History Narrative   Lives with husband.    14 grandchildren and 1 expected in 2023.   Caffeine- tea 1 glass   Social Determinants of Health   Financial Resource Strain: Low Risk    Difficulty of Paying Living Expenses: Not hard at all  Food Insecurity: No Food Insecurity   Worried About Programme researcher, broadcasting/film/video in the Last Year: Never true   Ran Out of Food in the Last Year: Never true  Transportation Needs: No Transportation Needs   Lack of Transportation (Medical): No   Lack of Transportation (Non-Medical): No  Physical Activity: Inactive   Days of Exercise per Week: 0 days   Minutes of Exercise per Session: 0 min  Stress: No Stress Concern Present   Feeling of Stress : Only a little  Social Connections: Press photographer of Communication with Friends and Family: More than three times a week   Frequency of Social  Gatherings with Friends and Family: More than three times a week   Attends Religious Services: More than 4 times per year   Active Member of [employer] or Organizations: Yes   Attends Banker Meetings: More than 4 times per year  Marital Status: Married  Human resources officer Violence: Not At Risk   Fear of Current or Ex-Partner: No   Emotionally Abused: No   Physically Abused: No   Sexually Abused: No   Family History  Problem Relation Age of Onset   Arthritis Mother    Asthma Mother    Hearing loss Mother    Hyperlipidemia Mother    Hypertension Mother    Miscarriages / [place] Mother    Stroke Mother    Hearing loss Father    Hypertension Father    Stroke Father    Diabetes Father    Bell's palsy Father    Cancer Brother        leukemia   Early death Brother    Arthritis Maternal Grandmother    Depression Maternal Grandmother    Diabetes Maternal Grandmother    Heart disease Maternal Grandmother        CHF   Miscarriages / Stillbirths Maternal Grandmother    Vision loss Maternal Grandmother    Alcohol abuse Maternal Grandfather    Heart attack Maternal Grandfather    Arthritis Paternal Grandmother    Heart disease Paternal Grandmother        massive heart attack   Miscarriages / Stillbirths Paternal Grandmother    Alcohol abuse Paternal Grandfather    Stroke Paternal Grandfather    Heart disease Paternal Grandfather        arteriosclerosis       Review of Systems  All other systems reviewed and are negative.     Objective:   Physical Exam Vitals reviewed.  Constitutional:      General: [name] not in acute distress.    Appearance: Normal appearance. [name] obese. [name] not ill-appearing, toxic-appearing or diaphoretic.  HENT:     Head: Normocephalic and atraumatic.  Cardiovascular:     Rate and Rhythm: Normal rate and regular rhythm.     Pulses: Normal pulses.     Heart sounds: Normal heart sounds. No murmur heard.   No friction rub. No  gallop.  Pulmonary:     Effort: Pulmonary effort is normal. No respiratory distress.     Breath sounds: Normal breath sounds. No stridor. No wheezing, rhonchi or rales.  Chest:     Chest wall: No tenderness.  Musculoskeletal:     Right knee: No ecchymosis or bony tenderness. Normal range of motion. No tenderness.     Right lower leg: No swelling, deformity, lacerations, tenderness or bony tenderness. No edema.     Right ankle: Normal.       Legs:  Neurological:     Mental Status: [name] alert.          Assessment & Plan:  Pain of right lower extremity - Plan: DG Tibia/Fibula Right  Her exam today is completely normal.  There is no evidence of any damage to the knee or ankle.  There is no visible swelling.  There is no bruising.  There is no erythema.  There is no evidence of neuropathy or peripheral vascular disease.  I suspect that she likely has a hematoma in the muscular compartment of the gastrocnemius muscle from falling that is causing tenderness and pain.  I will start by obtaining an x-ray given the vague nature of the pain.  If the x-ray is normal I will suggest clinical monitoring prior to proceeding with any MRI.  There is no evidence of a tear in the gastrocnemius muscle and there is no palpable mass.  I will gladly prescribe the patient Mancel Parsons however I do not believe her insurance will cover it.  I wrote a prescription for 0.5 mg subcu weekly.  We will see if her insurance will cover the prescription

## 2022-01-29 ENCOUNTER — Encounter (HOSPITAL_COMMUNITY): Payer: Self-pay | Admitting: Physical Therapy

## 2022-01-29 ENCOUNTER — Other Ambulatory Visit: Payer: Self-pay

## 2022-01-29 ENCOUNTER — Ambulatory Visit (HOSPITAL_COMMUNITY): Payer: Medicare Other | Admitting: Physical Therapy

## 2022-01-29 DIAGNOSIS — R42 Dizziness and giddiness: Secondary | ICD-10-CM | POA: Diagnosis not present

## 2022-01-29 DIAGNOSIS — R2689 Other abnormalities of gait and mobility: Secondary | ICD-10-CM

## 2022-01-29 NOTE — Therapy (Signed)
[facility] [address] [place], [place], [address] Phone: [phone]   Fax:  [phone]  PHYSICAL THERAPY DISCHARGE SUMMARY  Visits from Start of Care: 5  Current functional level related to goals / functional outcomes: See below   Remaining deficits: See below   Education / Equipment: See assessment   Patient agrees to discharge. Patient goals were met. Patient is being discharged due to meeting the stated rehab goals.   Physical Therapy Treatment  Patient Details  Name: Peggy Fitzgerald MRN: [id number] Date of Birth: 1956/02/04 Referring Provider (PT): Genia Harold MD   Encounter Date: 01/29/2022   PT End of Session - 01/29/22 1436     Visit Number 5    Number of Visits 8    Date for PT Re-Evaluation 01/29/22    Authorization Type Medicare A    PT Start Time 1432    PT Stop Time 6144    PT Time Calculation (min) 40 min    Activity Tolerance Patient tolerated treatment well    Behavior During Therapy WFL for tasks assessed/performed             Past Medical History:  Diagnosis Date   AC (acromioclavicular) joint bone spurs, right    Allergy    seasonal   Anxiety    Asthma    asthmatic bronchitis   Bipolar 1 disorder, depressed ([place])    Candida infection    Cataract 2007   Depression    Genital herpes    GERD (gastroesophageal reflux disease)    Headache    Herpes simplex type 2 infection    High cholesterol    Hyperlipidemia    Hypertension    Plantar fasciitis of right foot    Prediabetes    Rotator cuff tear    right shoulder    Past Surgical History:  Procedure Laterality Date   CATARACT EXTRACTION, BILATERAL     CHOLECYSTECTOMY  1987   KNEE SURGERY Right     There were no vitals filed for this visit.   Subjective Assessment - 01/29/22 1435     Subjective Patient says she and her husband were sick recently. She had a root canal last week so she hasnt done much. 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 She had a couple good days and has not had to use medications the past few days.    Patient Stated Goals Get rid of dizziness                [facility] PT Assessment - 01/29/22 0001       Assessment   Medical Diagnosis Vertigo    Referring Provider (PT) Genia Harold MD    Prior Therapy Yes in hospital      Precautions   Precautions Fall      Restrictions   Weight Bearing Restrictions No      Balance Screen   Has the patient fallen in the past 6 months No   none since starting therapy     Prior Function   Level of Independence Independent      Cognition   Overall Cognitive Status Within Functional Limits for tasks assessed      Observation/Other Assessments   Focus on Therapeutic Outcomes (FOTO)  58% function   was 48% function     Static Standing Balance   Static Standing Balance -  Activities  Tandam Stance - Right Leg;Tandam Stance - Left Leg;Single Leg Stance - Right Leg;Single Leg  Stance - Left Leg    Static Standing - Comment/# of Minutes R foot front 20 sec; L foot front 20 sec; SLS R and L 20 sec each                 Vestibular Assessment - 01/29/22 0001       Oculomotor Exam   Saccades Intact      Vestibulo-Ocular Reflex   VOR 1 Head Only (x 1 viewing) 1      Positional Sensitivities   Supine to Left Side Moderate dizziness    Supine to Right Side Mild dizziness    Head Turning x 5 Lightheadedness    Head Nodding x 5 Mild dizziness    Rolling Right Mild dizziness    Rolling Left Mild dizziness                                 PT Short Term Goals - 01/17/22 1532       PT SHORT TERM GOAL #1   Title Patient will be independent with initial HEP and self-management strategies to improve functional outcomes    Time 2    Period Weeks    Status Achieved    Target Date 01/08/22               PT Long Term Goals - 01/29/22 1441       PT LONG TERM GOAL #1   Title Patient will improve FOTO score to  predicted value to indicate improvement in functional outcomes    Time 4    Period Weeks    Status Achieved    Target Date 01/22/22      PT LONG TERM GOAL #2   Title Patient will be independent with advanced HEP and self-management strategies to improve functional outcomes    Baseline Reviewed and answered all patient questions    Time 4    Period Weeks    Status Achieved    Target Date 01/22/22      PT LONG TERM GOAL #3   Title Patient will be able to maintain tandem stance >20 seconds on BLEs to improve stability and reduce risk for falls    Baseline See balance    Time 4    Period Weeks    Status Achieved    Target Date 01/22/22      PT LONG TERM GOAL #4   Title Patient will report at least 50% overall improvement in subjective complaint of dizziness to indicate improvement in ability to perform ADLs.    Baseline Reports 60%    Time 4    Period Weeks    Status Achieved    Target Date 01/22/22                   Plan - 01/29/22 1518     Clinical Impression Statement Patient has met all set rehab goals.  PT discharged patient today to I HEP and patient in agreement with discharge plan. Reviewed HEP and encouraged patient to contact PT with any further concerns or questions.    Examination-Activity Limitations Locomotion Level;Stand;Transfers    Examination-Participation Restrictions Community Activity;Cleaning;Shop;Laundry    Stability/Clinical Decision Making Evolving/Moderate complexity    Rehab Potential Fair    PT Treatment/Interventions ADLs/Self Care Home Management;Biofeedback;Traction;Moist Heat;Stair training;Functional mobility training;Ultrasound;Canalith Repostioning;Parrafin;Therapeutic activities;Fluidtherapy;Cryotherapy;Electrical Stimulation;Contrast Bath;Therapeutic exercise;Orthotic Fit/Training;Patient/family education;Compression bandaging;Splinting;Taping;Vasopneumatic Device;Joint Manipulations;Spinal Manipulations;Energy conservation;Manual  techniques;Dry needling;Passive range of motion;Visual/perceptual remediation/compensation;Vestibular;Scar mobilization;Neuromuscular re-education;Balance  training;DME Instruction;Gait training;Iontophoresis 21m/ml Dexamethasone    PT Next Visit Plan DC to HEP    PT Home Exercise Plan Eval: seated head nods and turns. Seated gaze stabilization with head turns/nods    Consulted and Agree with Plan of Care Patient             Patient will benefit from skilled therapeutic intervention in order to improve the following deficits and impairments:  Abnormal gait, Dizziness, Decreased mobility, Decreased activity tolerance, Decreased balance  Visit Diagnosis: Dizziness and giddiness  Other abnormalities of gait and mobility     Problem List Patient Active Problem List   Diagnosis Date Noted   Right knee pain 10/18/2021   Obesity (BMI 30-39.9) 08/23/2021   Migraine 05/21/2018   Low back pain without sciatica 07/01/2017   Urinary tract infection without hematuria 07/01/2017   Vaginal dryness 07/01/2017   Urinary frequency 07/01/2017   Bipolar 1 disorder, mixed, moderate ([facility]) 08/28/2016   Insomnia 12/18/2015   Headache 12/18/2015   Vertigo 12/18/2015   Allergy    Anxiety    Asthma    Depression    Prediabetes    GERD (gastroesophageal reflux disease)    Hyperlipidemia    Hypertension    Herpes simplex type 2 infection    SHOULDER PAIN 04/24/2009   IMPINGEMENT SYNDROME 04/24/2009   3:28 PM, 01/29/22 CJosue HectorPT DPT  [facility] [phone] [place] [address]Phone: [phone]  Fax:  [phone] Name: Peggy NIERMANMRN: [id number] of Birth: [birth year]

## 2022-01-29 NOTE — Patient Instructions (Signed)
Access Code: [email or web address] URL: [email or web address] Date: 01/29/2022 Prepared by: Georges Lynch  Exercises Half Tandem Stance Balance with Head Rotation - 1 x daily - 7 x weekly - 2-3 sets - 10 reps Romberg Stance with Head Rotation - 1 x daily - 7 x weekly - 2-3 sets - 10 reps

## 2022-01-31 ENCOUNTER — Encounter (HOSPITAL_COMMUNITY): Payer: TRICARE For Life (TFL) | Admitting: Physical Therapy

## 2022-02-03 ENCOUNTER — Other Ambulatory Visit: Payer: Self-pay | Admitting: Family Medicine

## 2022-02-05 ENCOUNTER — Encounter (HOSPITAL_COMMUNITY): Payer: TRICARE For Life (TFL) | Admitting: Physical Therapy

## 2022-02-13 ENCOUNTER — Ambulatory Visit (HOSPITAL_COMMUNITY): Payer: Medicare Other | Admitting: Physical Therapy

## 2022-02-13 ENCOUNTER — Telehealth: Payer: Self-pay

## 2022-02-13 ENCOUNTER — Other Ambulatory Visit: Payer: Self-pay | Admitting: Family Medicine

## 2022-02-13 NOTE — Telephone Encounter (Signed)
PA started for [facility] on CMM ? ?Jearld Pies (Key: [id number]) ?

## 2022-02-14 NOTE — Telephone Encounter (Signed)
PA for [facility] DENIED per insurance.  ? ?Patient has been notified. Patient has been offered referral to [facility] and has declined at this time.  ?

## 2022-03-02 ENCOUNTER — Other Ambulatory Visit: Payer: Self-pay

## 2022-03-02 ENCOUNTER — Ambulatory Visit
Admission: EM | Admit: 2022-03-02 | Discharge: 2022-03-02 | Disposition: A | Discharge status: Home / Self Care | Payer: Medicare Other | Attending: Student | Admitting: Student

## 2022-03-02 DIAGNOSIS — B372 Candidiasis of skin and nail: Secondary | ICD-10-CM

## 2022-03-02 MED ORDER — FLUCONAZOLE 150 MG PO TABS: 150.0000 mg | ORAL_TABLET | Freq: Every day | ORAL | 0 refills | Status: DC

## 2022-03-02 NOTE — ED Provider Notes (Signed)
?[facility] ? ? ? ?CSN: [id number] ?Arrival date & time: 03/02/22  1359 ? ? ?  ? ?History   ?Chief Complaint ?Chief Complaint  ?Patient presents with  ? Vaginitis  ? ? ?HPI ?Peggy Fitzgerald is a 66 y.o. female presenting with recurrent candidiasis under the pannus and genital area.  States she has had infections like this in the past, and typically requires Diflucan for resolution.  She has been using clotrimazole cream with minimal relief.  She attempts to keep the area dry by placing a strip of cloth between the pannus and the belly. ? ?HPI ? ?Past Medical History:  ?Diagnosis Date  ? AC (acromioclavicular) joint bone spurs, right   ? Allergy   ? seasonal  ? Anxiety   ? Asthma   ? asthmatic bronchitis  ? Bipolar 1 disorder, depressed (HCC)   ? Candida infection   ? Cataract 2007  ? Depression   ? Genital herpes   ? GERD (gastroesophageal reflux disease)   ? Headache   ? Herpes simplex type 2 infection   ? High cholesterol   ? Hyperlipidemia   ? Hypertension   ? Plantar fasciitis of right foot   ? Prediabetes   ? Rotator cuff tear   ? right shoulder  ? ? ?Patient Active Problem List  ? Diagnosis Date Noted  ? Right knee pain 10/18/2021  ? Obesity (BMI 30-39.9) 08/23/2021  ? Migraine 05/21/2018  ? Low back pain without sciatica 07/01/2017  ? Urinary tract infection without hematuria 07/01/2017  ? Vaginal dryness 07/01/2017  ? Urinary frequency 07/01/2017  ? Bipolar 1 disorder, mixed, moderate (HCC) 08/28/2016  ? Insomnia 12/18/2015  ? Headache 12/18/2015  ? Vertigo 12/18/2015  ? Allergy   ? Anxiety   ? Asthma   ? Depression   ? Prediabetes   ? GERD (gastroesophageal reflux disease)   ? Hyperlipidemia   ? Hypertension   ? Herpes simplex type 2 infection   ? SHOULDER PAIN 04/24/2009  ? IMPINGEMENT SYNDROME 04/24/2009  ? ? ?Past Surgical History:  ?Procedure Laterality Date  ? CATARACT EXTRACTION, BILATERAL    ? CHOLECYSTECTOMY  1987  ? KNEE SURGERY Right   ? ? ?OB History   ? ? Gravida  ?3  ? Para  ?3   ? Term  ?3  ? Preterm  ?   ? AB  ?   ? Living  ?3  ?  ? ? SAB  ?   ? IAB  ?   ? Ectopic  ?   ? Multiple  ?   ? Live Births  ?3  ?   ?  ?  ? ? ? ?Home Medications   ? ?Prior to Admission medications   ?Medication Sig Start Date End Date Taking? Authorizing Provider  ?acyclovir cream (ZOVIRAX) 5 % APPLY 1 APPLICATION TOPICALLY TWICE A DAY AS NEEDED FOR OUTBREAKS 10/23/21   Donita Brooks, MD  ?albuterol (VENTOLIN HFA) 108 (90 Base) MCG/ACT inhaler Inhale 2 puffs into the lungs every 4 (four) hours as needed for wheezing or shortness of breath. 12/15/20   Salley Scarlet, MD  ?amoxicillin (AMOXIL) 500 MG capsule Take by mouth. 01/22/22   [provider]  ?atorvastatin (LIPITOR) 40 MG tablet TAKE 1 TABLET DAILY 01/04/21   Donita Brooks, MD  ?Butalbital-APAP-Caffeine 50-300-40 MG CAPS TAKE 1 CAPSULE FOUR TIMES A DAY AS NEEDED 02/04/22   Donita Brooks, MD  ?clobetasol (TEMOVATE) 0.05 % external solution APPLY 1  APPLICATION TOPICALLY TWICE A DAY AS NEEDED, APPLY TO SCALP AS NEEDED FOR IRRITATION 02/14/22   Donita BrooksPickard, Warren T, MD  ?fluconazole (DIFLUCAN) 150 MG tablet Take 1 tablet (150 mg total) by mouth daily. -For your yeast infection, start the Diflucan (fluconazole)- Take one pill today (day 1). If you're still having symptoms in 3 days, take the second pill. If you're still having symptoms in 3 days, take the third pill. 03/02/22   Rhys MartiniGraham, Julene Rahn E, PA-C  ?fluticasone (FLONASE) 50 MCG/ACT nasal spray Place 2 sprays into both nostrils daily. 12/22/20   [place], Velna HatchetKawanta F, MD  ?Fremanezumab-vfrm (AJOVY) 225 MG/1.5ML SOAJ Inject 225 mg into the skin every 30 (thirty) days. 01/21/22   Ocie Doynehima, Jennifer, MD  ?hydrOXYzine (ATARAX/VISTARIL) 25 MG tablet Take 1 tablet (25 mg total) by mouth 3 (three) times daily as needed. 09/06/20   Salley Scarleturham, Kawanta F, MD  ?hydrOXYzine (VISTARIL) 25 MG capsule Take 25 mg by mouth 3 (three) times daily. 01/07/22   [provider]  ?lamoTRIgine (LAMICTAL) 25 MG tablet Take 1  tablet (25 mg total) by mouth daily. 08/30/16   [place], Everardo AllJohn C, FNP  ?LATUDA 80 MG TABS tablet Take 80 mg by mouth daily.  02/11/17   [provider]  ?LORazepam (ATIVAN) 0.5 MG tablet Take 1 tablet (0.5 mg total) by mouth every 8 (eight) hours as needed for anxiety (itching). 09/21/20   Donita BrooksPickard, Warren T, MD  ?meclizine (ANTIVERT) 25 MG tablet TAKE 1 TABLET AS NEEDED FOR DIZZINESS 12/07/21   Donita BrooksPickard, Warren T, MD  ?methocarbamol (ROBAXIN) 500 MG tablet TAKE 1 TABLET FOUR TIMES A DAY 11/20/20   Donita BrooksPickard, Warren T, MD  ?metoprolol succinate (TOPROL-XL) 100 MG 24 hr tablet TAKE 1 TABLET AT BEDTIME WITH OR IMMEDIATELY FOLLOWING A MEAL  (CALL CLINIC TO SCHEDULE APPOINTMENT FOR FUTURE REFILLS. [phone]) 08/06/21   Donita BrooksPickard, Warren T, MD  ?montelukast (SINGULAIR) 10 MG tablet Take 1 tablet (10 mg total) by mouth at bedtime. 10/18/21   Donita BrooksPickard, Warren T, MD  ?promethazine (PHENERGAN) 25 MG tablet TAKE 1 TABLET EVERY 6 HOURS AS NEEDED FOR NAUSEA OR VOMITING 02/20/21   Donita BrooksPickard, Warren T, MD  ?rizatriptan (MAXALT) 10 MG tablet Take 1 tablet (10 mg total) by mouth as needed for migraine. May repeat in 2 hours if needed 01/10/22   Ocie Doynehima, Jennifer, MD  ?Semaglutide-Weight Management [facility](WEGOVY) 0.5 MG/0.5ML SOAJ Inject 0.5 mg into the skin once a week. 01/25/22   Donita BrooksPickard, Warren T, MD  ?valACYclovir (VALTREX) 500 MG tablet TAKE 1 TABLET DAILY 11/14/21   Donita BrooksPickard, Warren T, MD  ?zolpidem (AMBIEN) 5 MG tablet Take 5 mg by mouth at bedtime as needed. 11/10/21   [provider]  ? ? ?Family History ?Family History  ?Problem Relation Age of Onset  ? Arthritis Mother   ? Asthma Mother   ? Hearing loss Mother   ? Hyperlipidemia Mother   ? Hypertension Mother   ? [place]   ? Stroke Mother   ? Hearing loss Father   ? Hypertension Father   ? Stroke Father   ? Diabetes Father   ? Bell's palsy Father   ? Cancer Brother   ?     leukemia  ? Early death Brother   ? Arthritis Maternal Grandmother   ? Depression  Maternal Grandmother   ? Diabetes Maternal Grandmother   ? Heart disease Maternal Grandmother   ?     CHF  ? Miscarriages / Stillbirths Maternal Grandmother   ?  Vision loss Maternal Grandmother   ? Alcohol abuse Maternal Grandfather   ? Heart attack Maternal Grandfather   ? Arthritis Paternal Grandmother   ? Heart disease Paternal Grandmother   ?     massive heart attack  ? Miscarriages / Stillbirths Paternal Grandmother   ? Alcohol abuse Paternal Grandfather   ? Stroke Paternal Grandfather   ? Heart disease Paternal Grandfather   ?     arteriosclerosis  ? ? ?Social History ?Social History  ? ?Tobacco Use  ? Smoking status: Former  ?  Years: 0.50  ?  Types: Cigarettes  ? Smokeless tobacco: Never  ? Tobacco comments:  ?  Quit many years ago  ?Vaping Use  ? Vaping Use: Never used  ?Substance Use Topics  ? Alcohol use: No  ? Drug use: No  ? ? ? ?Allergies   ?Milk-related compounds and Oxycodone-acetaminophen ? ? ?Review of Systems ?Review of Systems  ?Skin:  Positive for rash.  ?All other systems reviewed and are negative. ? ? ?Physical Exam ?Triage Vital Signs ?ED Triage Vitals  ?Enc Vitals Group  ?   BP 03/02/22 1516 (!) 142/87  ?   Pulse Rate 03/02/22 1516 65  ?   Resp 03/02/22 1516 18  ?   Temp 03/02/22 1516 98.3 ?F (36.8 ?C)  ?   Temp Source 03/02/22 1516 Oral  ?   SpO2 03/02/22 1516 98 %  ?   Weight --   ?   Height --   ?   Head Circumference --   ?   Peak Flow --   ?   Pain Score 03/02/22 1513 5  ?   Pain Loc --   ?   Pain Edu? --   ?   Excl. in GC? --   ? ?No data found. ? ?Updated Vital Signs ?BP (!) 142/87 (BP Location: Right Arm)   Pulse 65   Temp 98.3 ?F (36.8 ?C) (Oral)   Resp 18   SpO2 98%  ? ?Visual Acuity ?Right Eye Distance:   ?Left Eye Distance:   ?Bilateral Distance:   ? ?Right Eye Near:   ?Left Eye Near:    ?Bilateral Near:    ? ?Physical Exam ?Vitals reviewed.  ?Constitutional:   ?   General: [name] not in acute distress. ?   Appearance: Normal appearance. [name] not ill-appearing.  ?HENT:  ?    Head: Normocephalic and atraumatic.  ?Pulmonary:  ?   Effort: Pulmonary effort is normal.  ?Skin: ?   Comments: Beefy red plaques below pannus.  Declines genital exam.  ?Neurological:  ?   General: No focal d

## 2022-03-02 NOTE — Discharge Instructions (Addendum)
-  For your yeast infection, start the Diflucan (fluconazole)- Take one pill today (day 1). If you're still having symptoms in 3 days, take the second pill. If you're still having symptoms in 3 days, take the third pill.  ?-Keep the area as dry as possible. ?

## 2022-03-02 NOTE — ED Triage Notes (Signed)
Pt states she started growing yeast under her belly and in between her legs for abut 2 to 3 days ? ?Pt states she she has been using clobetasol for the yeast but she is having some pain in her belly ? ?Denies Fever ?

## 2022-03-07 IMAGING — MG MM DIGITAL SCREENING BILAT W/ TOMO AND CAD
8 series · 8 of 24 positions shown · non-contrast
Comparison: Previous exam(s).

CLINICAL DATA: Screening.

EXAM:
DIGITAL SCREENING BILATERAL MAMMOGRAM WITH TOMOSYNTHESIS AND CAD
TECHNIQUE: Bilateral screening digital craniocaudal and mediolateral oblique
mammograms were obtained. Bilateral screening digital breast
tomosynthesis was performed. The images were evaluated with
computer-aided detection.

[L MLO synth-2D]
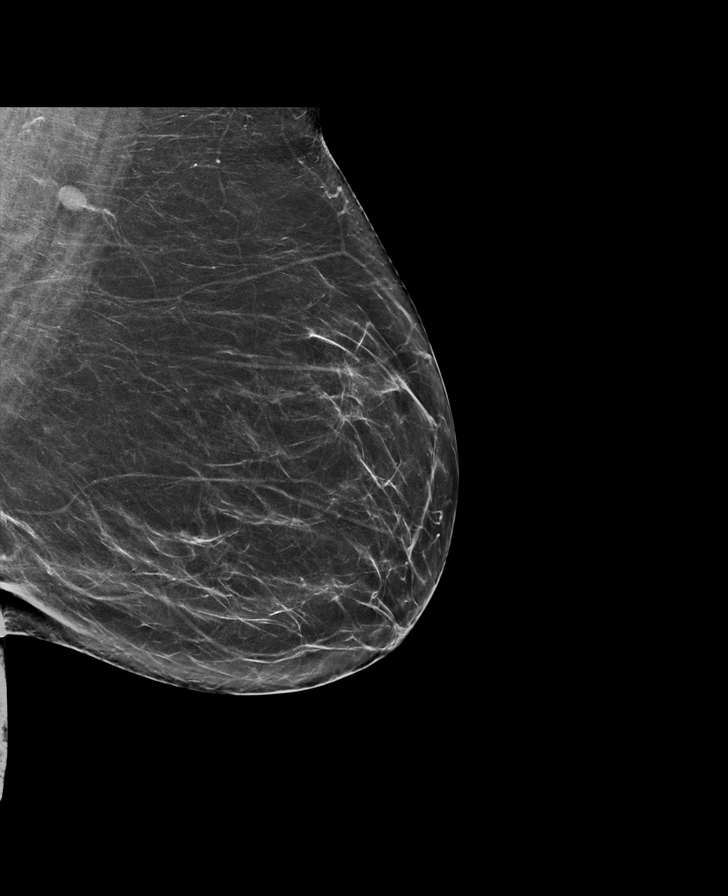

[R CC synth-2D]
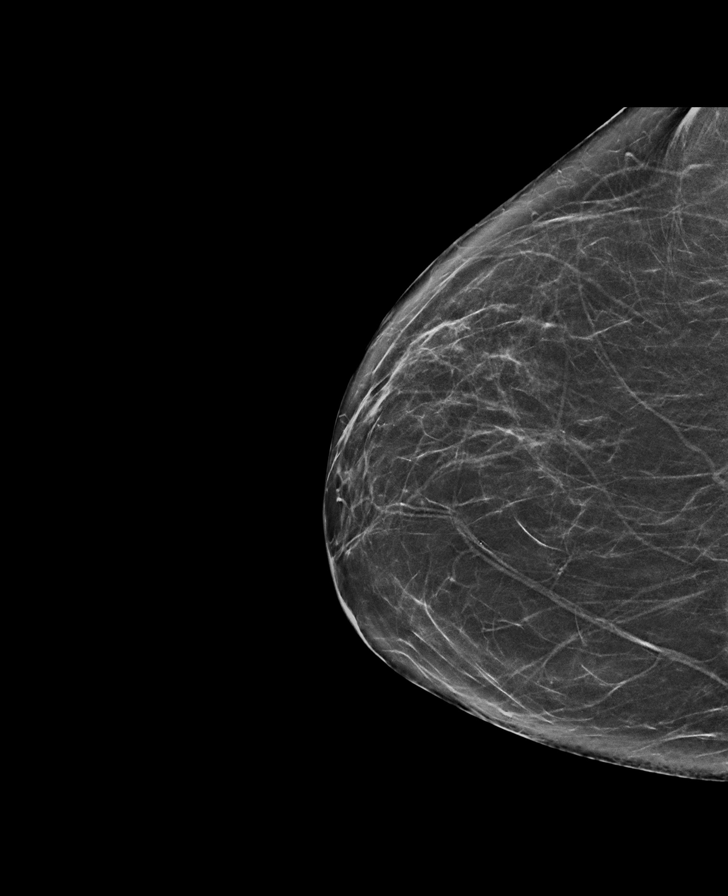

[L CC synth-2D]
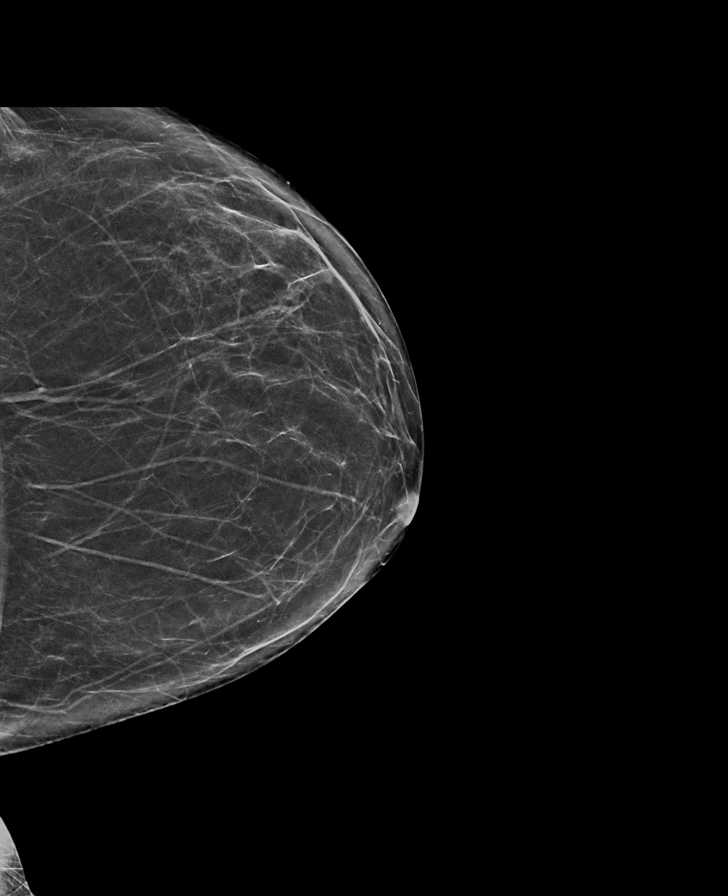

[R MLO synth-2D]
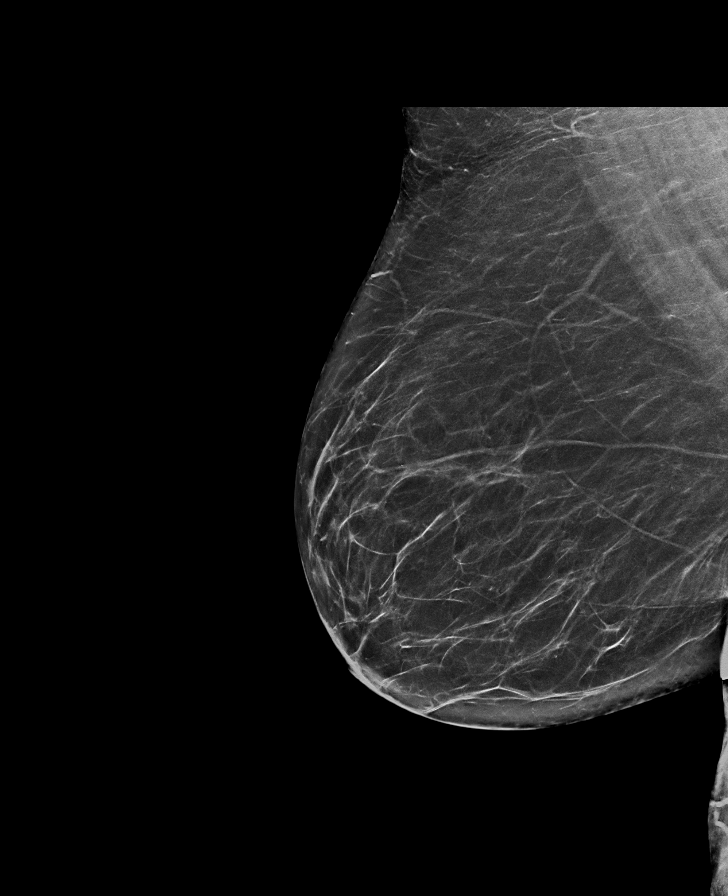

[R CC tomo · tomo slice 31/62.0]
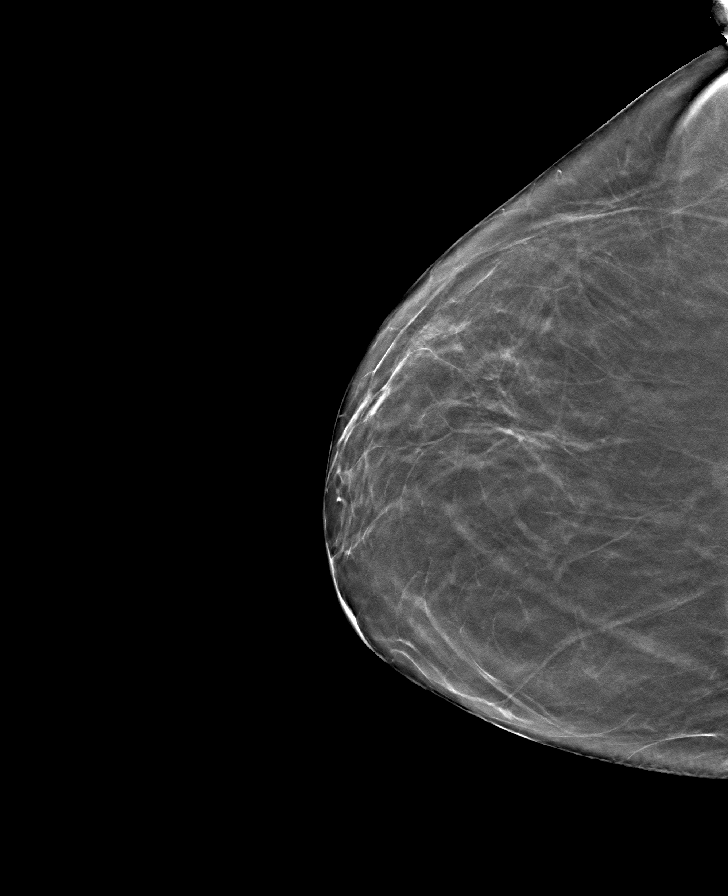

[R MLO tomo · tomo slice 36/71.0]
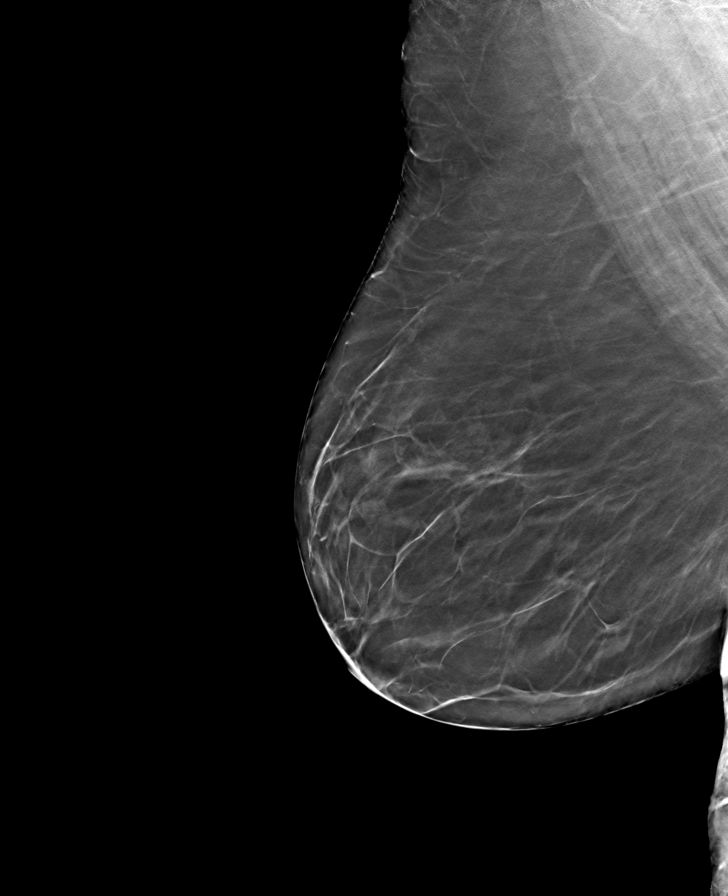

[L CC tomo · tomo slice 31/62.0]
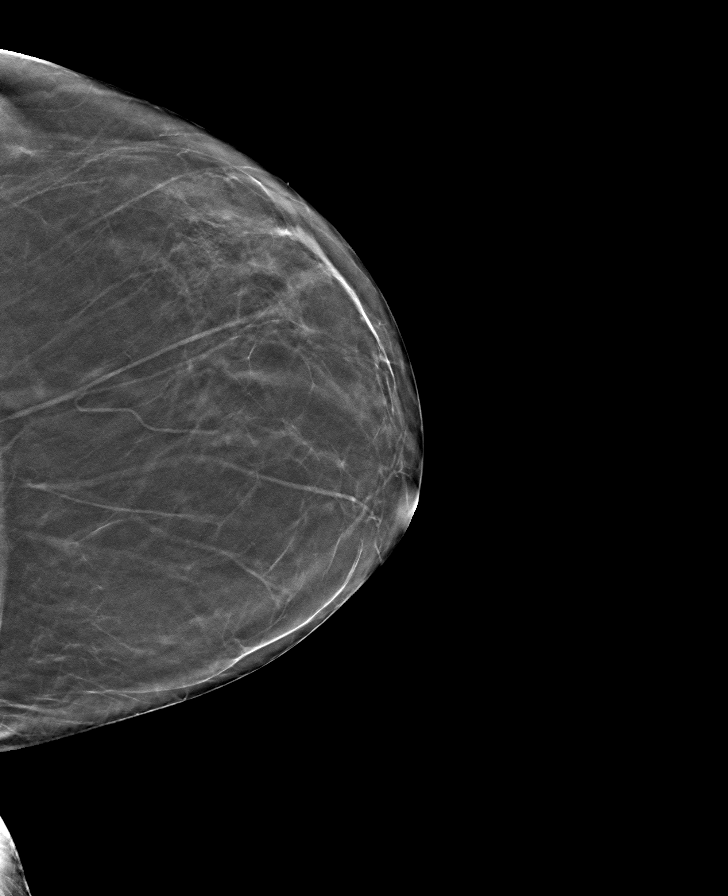

[L MLO tomo · tomo slice 35/70.0]
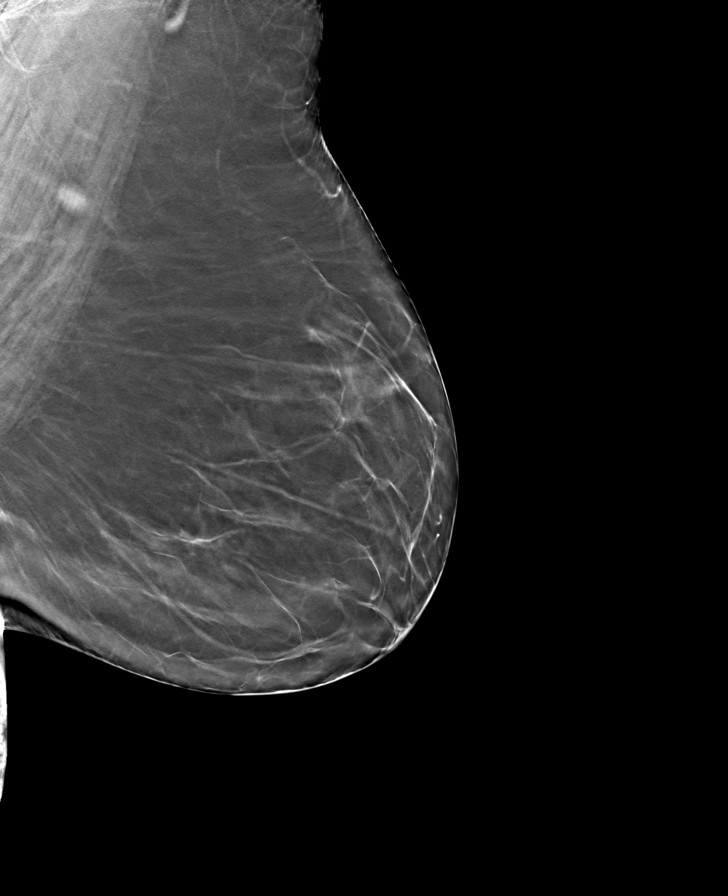

[8 of 24 positions shown; findings below may reference images not displayed]

ACR Breast Density Category b: There are scattered areas of
fibroglandular density.
FINDINGS: There are no findings suspicious for malignancy. The images were
evaluated with computer-aided detection.
IMPRESSION: No mammographic evidence of malignancy. A result letter of this
screening mammogram will be mailed directly to the patient.

RECOMMENDATION:
Screening mammogram in one year. (Code:WJ-I-BG6)

BI-RADS CATEGORY  1: Negative.

## 2022-03-18 ENCOUNTER — Ambulatory Visit
Admission: EM | Admit: 2022-03-18 | Discharge: 2022-03-18 | Disposition: A | Discharge status: Home / Self Care | Payer: Medicare Other | Attending: Urgent Care | Admitting: Urgent Care

## 2022-03-18 DIAGNOSIS — R102 Pelvic and perineal pain: Secondary | ICD-10-CM | POA: Diagnosis not present

## 2022-03-18 DIAGNOSIS — B3731 Acute candidiasis of vulva and vagina: Secondary | ICD-10-CM | POA: Diagnosis not present

## 2022-03-18 MED ORDER — LIDOCAINE (ANORECTAL) 5 % EX GEL
CUTANEOUS | 0 refills | Status: DC
Start: 2022-03-18 — End: 2022-04-18

## 2022-03-18 MED ORDER — FLUCONAZOLE 200 MG PO TABS: 200.0000 mg | ORAL_TABLET | ORAL | 0 refills | Status: AC

## 2022-03-18 NOTE — ED Provider Notes (Signed)
?[facility] ? ? ?MRN: [id number] DOB: [id number] ? ?Subjective:  ? ?Peggy LiterCynthia I Fitzgerald is a 66 y.o. female presenting for 5 day history of acute onset persistent vaginal pain.  Patient has a remote history of an episiotomy and has always had pain for 2 to 3 days after she has had sex.  Usually this can improve with Vagicaine but has not helped.  She has also had a recurrence of her yeast infection.  Reports that she took fluconazole for yeast infection at the end of March but has since returned.  No bleeding, dysuria, urinary frequency.  She does have a history of herpes simplex type II infection.  She cannot appreciate any particular blisters or vesicular lesions.  She does have a history of vaginal dryness as well.  Has not seen a gynecologist for years. ? ?No current facility-administered medications for this encounter. ? ?Current Outpatient Medications:  ?  acyclovir cream (ZOVIRAX) 5 %, APPLY 1 APPLICATION TOPICALLY TWICE A DAY AS NEEDED FOR OUTBREAKS, Disp: 15 g, Rfl: 3 ?  albuterol (VENTOLIN HFA) 108 (90 Base) MCG/ACT inhaler, Inhale 2 puffs into the lungs every 4 (four) hours as needed for wheezing or shortness of breath., Disp: 18 g, Rfl: 1 ?  amoxicillin (AMOXIL) 500 MG capsule, Take by mouth., Disp: , Rfl:  ?  atorvastatin (LIPITOR) 40 MG tablet, TAKE 1 TABLET DAILY, Disp: 90 tablet, Rfl: 3 ?  Butalbital-APAP-Caffeine 50-300-40 MG CAPS, TAKE 1 CAPSULE FOUR TIMES A DAY AS NEEDED, Disp: 30 capsule, Rfl: 3 ?  clobetasol (TEMOVATE) 0.05 % external solution, APPLY 1 APPLICATION TOPICALLY TWICE A DAY AS NEEDED, APPLY TO SCALP AS NEEDED FOR IRRITATION, Disp: 150 mL, Rfl: 3 ?  fluconazole (DIFLUCAN) 150 MG tablet, Take 1 tablet (150 mg total) by mouth daily. -For your yeast infection, start the Diflucan (fluconazole)- Take one pill today (day 1). If you're still having symptoms in 3 days, take the second pill. If you're still having symptoms in 3 days, take the third pill., Disp: 3 tablet, Rfl:  0 ?  fluticasone (FLONASE) 50 MCG/ACT nasal spray, Place 2 sprays into both nostrils daily., Disp: 16 g, Rfl: 6 ?  Fremanezumab-vfrm (AJOVY) 225 MG/1.5ML SOAJ, Inject 225 mg into the skin every 30 (thirty) days., Disp: 5.04 mL, Rfl: 2 ?  hydrOXYzine (ATARAX/VISTARIL) 25 MG tablet, Take 1 tablet (25 mg total) by mouth 3 (three) times daily as needed., Disp: , Rfl:  ?  hydrOXYzine (VISTARIL) 25 MG capsule, Take 25 mg by mouth 3 (three) times daily., Disp: , Rfl:  ?  lamoTRIgine (LAMICTAL) 25 MG tablet, Take 1 tablet (25 mg total) by mouth daily., Disp: 14 tablet, Rfl: 0 ?  LATUDA 80 MG TABS tablet, Take 80 mg by mouth daily. , Disp: , Rfl:  ?  LORazepam (ATIVAN) 0.5 MG tablet, Take 1 tablet (0.5 mg total) by mouth every 8 (eight) hours as needed for anxiety (itching)., Disp: 30 tablet, Rfl: 0 ?  meclizine (ANTIVERT) 25 MG tablet, TAKE 1 TABLET AS NEEDED FOR DIZZINESS, Disp: 90 tablet, Rfl: 3 ?  methocarbamol (ROBAXIN) 500 MG tablet, TAKE 1 TABLET FOUR TIMES A DAY, Disp: 90 tablet, Rfl: 14 ?  metoprolol succinate (TOPROL-XL) 100 MG 24 hr tablet, TAKE 1 TABLET AT BEDTIME WITH OR IMMEDIATELY FOLLOWING A MEAL  (CALL CLINIC TO SCHEDULE APPOINTMENT FOR FUTURE REFILLS. [phone]), Disp: 90 tablet, Rfl: 3 ?  montelukast (SINGULAIR) 10 MG tablet, Take 1 tablet (10 mg total) by mouth at bedtime., Disp: 30 tablet,  Rfl: 3 ?  promethazine (PHENERGAN) 25 MG tablet, TAKE 1 TABLET EVERY 6 HOURS AS NEEDED FOR NAUSEA OR VOMITING, Disp: 90 tablet, Rfl: 14 ?  rizatriptan (MAXALT) 10 MG tablet, Take 1 tablet (10 mg total) by mouth as needed for migraine. May repeat in 2 hours if needed, Disp: 10 tablet, Rfl: 3 ?  Semaglutide-Weight Management (WEGOVY) 0.5 MG/0.5ML SOAJ, Inject 0.5 mg into the skin once a week., Disp: 2 mL, Rfl: 1 ?  valACYclovir (VALTREX) 500 MG tablet, TAKE 1 TABLET DAILY, Disp: 90 tablet, Rfl: 3 ?  zolpidem (AMBIEN) 5 MG tablet, Take 5 mg by mouth at bedtime as needed., Disp: , Rfl:   ? ?Allergies  ?Allergen  Reactions  ? Milk-Related Compounds Diarrhea  ?  Cannot drink plain milk, but can eat ice cream, and milk in cereal  ? Oxycodone-Acetaminophen Itching  ?  Nightmares  ? ? ?Past Medical History:  ?Diagnosis Date  ? AC (acromioclavicular) joint bone spurs, right   ? Allergy   ? seasonal  ? Anxiety   ? Asthma   ? asthmatic bronchitis  ? Bipolar 1 disorder, depressed (HCC)   ? Candida infection   ? Cataract 2007  ? Depression   ? Genital herpes   ? GERD (gastroesophageal reflux disease)   ? Headache   ? Herpes simplex type 2 infection   ? High cholesterol   ? Hyperlipidemia   ? Hypertension   ? Plantar fasciitis of right foot   ? Prediabetes   ? Rotator cuff tear   ? right shoulder  ?  ? ?Past Surgical History:  ?Procedure Laterality Date  ? CATARACT EXTRACTION, BILATERAL    ? CHOLECYSTECTOMY  1987  ? KNEE SURGERY Right   ? ? ?Family History  ?Problem Relation Age of Onset  ? Arthritis Mother   ? Asthma Mother   ? Hearing loss Mother   ? Hyperlipidemia Mother   ? Hypertension Mother   ? Miscarriages / [place] Mother   ? Stroke Mother   ? Hearing loss Father   ? Hypertension Father   ? Stroke Father   ? Diabetes Father   ? Bell's palsy Father   ? Cancer Brother   ?     leukemia  ? Early death Brother   ? Arthritis Maternal Grandmother   ? Depression Maternal Grandmother   ? Diabetes Maternal Grandmother   ? Heart disease Maternal Grandmother   ?     CHF  ? Miscarriages / Stillbirths Maternal Grandmother   ? Vision loss Maternal Grandmother   ? Alcohol abuse Maternal Grandfather   ? Heart attack Maternal Grandfather   ? Arthritis Paternal Grandmother   ? Heart disease Paternal Grandmother   ?     massive heart attack  ? Miscarriages / Stillbirths Paternal Grandmother   ? Alcohol abuse Paternal Grandfather   ? Stroke Paternal Grandfather   ? Heart disease Paternal Grandfather   ?     arteriosclerosis  ? ? ?Social History  ? ?Tobacco Use  ? Smoking status: Former  ?  Years: 0.50  ?  Types: Cigarettes  ? Smokeless  tobacco: Never  ? Tobacco comments:  ?  Quit many years ago  ?Vaping Use  ? Vaping Use: Never used  ?Substance Use Topics  ? Alcohol use: No  ? Drug use: No  ? ? ?ROS ? ? ?Objective:  ? ?Vitals: ?BP (!) 157/98 (BP Location: Right Arm)   Pulse 96   Temp 98.4 ?F (36.9 ?  C) (Oral)   Resp 18   SpO2 97%  ? ?Physical Exam ?Exam conducted with a chaperone present (RN Stone).  ?Constitutional:   ?   General: [name] not in acute distress. ?   Appearance: Normal appearance. [name] well-developed. [name] not ill-appearing, toxic-appearing or diaphoretic.  ?HENT:  ?   Head: Normocephalic and atraumatic.  ?   Right Ear: External ear normal.  ?   Left Ear: External ear normal.  ?   Nose: Nose normal.  ?   Mouth/Throat:  ?   Mouth: Mucous membranes are moist.  ?Eyes:  ?   General: No scleral icterus.    ?   Right eye: No discharge.     ?   Left eye: No discharge.  ?   Extraocular Movements: Extraocular movements intact.  ?Cardiovascular:  ?   Rate and Rhythm: Normal rate.  ?Pulmonary:  ?   Effort: Pulmonary effort is normal.  ?Genitourinary: ? ? ?Skin: ?   General: Skin is warm and dry.  ?Neurological:  ?   General: No focal deficit present.  ?   Mental Status: [name] alert and oriented to person, place, and time.  ?Psychiatric:     ?   Mood and Affect: Mood normal.     ?   Behavior: Behavior normal.  ? ? ?Assessment and Plan :  ? ?PDMP not reviewed this encounter. ? ?1. Yeast vaginitis   ?2. Vaginal pain   ? ?Recommended conservative management for vaginal tear, will increase her topical therapy to lidocaine 5% gel.  Use fluconazole 200 mg once weekly for the yeast vaginitis.  Follow-up with OB/GYN as soon as possible. Counseled patient on potential for adverse effects with medications prescribed/recommended today, ER and return-to-clinic precautions discussed, patient verbalized understanding. ? ?  ?Wallis Bamberg, PA-C ?03/18/22 1638 ? ?

## 2022-03-18 NOTE — ED Triage Notes (Addendum)
Pt reports vaginal pain x 5 days. Pt states the pain is where she had the episiotomy. States pain started after she had intercourse. Tylenol and Aleve gives no relief.  ? ?Pt reports she started having yeast infection between legs after the finished the treatment.  ?

## 2022-03-26 ENCOUNTER — Encounter: Payer: Self-pay | Admitting: Psychiatry

## 2022-03-27 ENCOUNTER — Other Ambulatory Visit: Payer: Self-pay | Admitting: *Deleted

## 2022-03-27 DIAGNOSIS — G43019 Migraine without aura, intractable, without status migrainosus: Secondary | ICD-10-CM

## 2022-03-27 MED ORDER — RIZATRIPTAN BENZOATE 10 MG PO TABS: 10.0000 mg | ORAL_TABLET | ORAL | 1 refills | Status: DC | PRN

## 2022-04-02 DIAGNOSIS — F319 Bipolar disorder, unspecified: Secondary | ICD-10-CM | POA: Diagnosis not present

## 2022-04-02 DIAGNOSIS — F411 Generalized anxiety disorder: Secondary | ICD-10-CM | POA: Diagnosis not present

## 2022-04-08 ENCOUNTER — Other Ambulatory Visit: Payer: Self-pay | Admitting: Family Medicine

## 2022-04-11 ENCOUNTER — Ambulatory Visit
Admission: EM | Admit: 2022-04-11 | Discharge: 2022-04-11 | Disposition: A | Discharge status: Home / Self Care | Payer: Medicare Other | Attending: Nurse Practitioner | Admitting: Nurse Practitioner

## 2022-04-11 ENCOUNTER — Encounter: Payer: Self-pay | Admitting: Emergency Medicine

## 2022-04-11 ENCOUNTER — Other Ambulatory Visit: Payer: Self-pay

## 2022-04-11 DIAGNOSIS — R21 Rash and other nonspecific skin eruption: Secondary | ICD-10-CM

## 2022-04-11 MED ORDER — TRIAMCINOLONE ACETONIDE 0.5 % EX OINT: 1.0000 "application " | TOPICAL_OINTMENT | Freq: Two times a day (BID) | CUTANEOUS | 0 refills | Status: DC

## 2022-04-11 MED ORDER — MUPIROCIN 2 % EX OINT: 1.0000 "application " | TOPICAL_OINTMENT | Freq: Two times a day (BID) | CUTANEOUS | 0 refills | Status: DC

## 2022-04-11 NOTE — Discharge Instructions (Signed)
Use medication as prescribed. ?Try not to scratch or irritate the areas while symptoms persist. ?Recommend using Aveeno colloidal oatmeal bath to help with itching. ?Avoid hot baths or showers while symptoms persist. ?Tried to avoid nicking or disrupting the skin with a razor to avoid recurrence times. ?Follow-up if symptoms worsen or do not improve. ?

## 2022-04-11 NOTE — ED Triage Notes (Signed)
Pt reports "rash-like areas" x1.5 week. Pt noted to have red raised areas with intermittent itching to LLE, left and right shoulder.  ?

## 2022-04-11 NOTE — ED Provider Notes (Signed)
?[facility] ? ? ? ?CSN: [id number] ?Arrival date & time: 04/11/22  [id number] ? ? ?  ? ?History   ?Chief Complaint ?Chief Complaint  ?Patient presents with  ? Rash  ? ? ?HPI ?[name] is a 66 y.o. female.  ? ?Patient is a 66 year old female who presents for rash.  She states that her symptoms are in multiple areas.  She notes that she has an area on her left lower extremity and bilateral shoulders.  With regard to the area on her left lower leg, she states that she shaved the area and nicked herself.  She states after she did that, she noticed that the area became itchy and that she began to scratch it.  Since she is started scratching it more, the area has become irritated and is sore to touch.  On her shoulders, she notices what she calls "bumps on her shoulders that she is since started scratching.  The areas are on the back of her shoulders.  The areas are now red and remain itchy.  There is some scabbing over the areas as well.  Patient denies any new soaps, detergents, lotions, medications, or food.  She states that she normally takes hydroxyzine at night, but that is not helping with her itching. ? ?The history is provided by the patient.  ? ?Past Medical History:  ?Diagnosis Date  ? AC (acromioclavicular) joint bone spurs, right   ? Allergy   ? seasonal  ? Anxiety   ? Asthma   ? asthmatic bronchitis  ? Bipolar 1 disorder, depressed (HCC)   ? Candida infection   ? Cataract 2007  ? Depression   ? Genital herpes   ? GERD (gastroesophageal reflux disease)   ? Headache   ? Herpes simplex type 2 infection   ? High cholesterol   ? Hyperlipidemia   ? Hypertension   ? Plantar fasciitis of right foot   ? Prediabetes   ? Rotator cuff tear   ? right shoulder  ? ? ?Patient Active Problem List  ? Diagnosis Date Noted  ? Right knee pain 10/18/2021  ? Obesity (BMI 30-39.9) 08/23/2021  ? Migraine 05/21/2018  ? Low back pain without sciatica 07/01/2017  ? Urinary tract infection without hematuria 07/01/2017  ?  Vaginal dryness 07/01/2017  ? Urinary frequency 07/01/2017  ? Bipolar 1 disorder, mixed, moderate (HCC) 08/28/2016  ? Insomnia 12/18/2015  ? Headache 12/18/2015  ? Vertigo 12/18/2015  ? Allergy   ? Anxiety   ? Asthma   ? Depression   ? Prediabetes   ? GERD (gastroesophageal reflux disease)   ? Hyperlipidemia   ? Hypertension   ? Herpes simplex type 2 infection   ? SHOULDER PAIN 04/24/2009  ? IMPINGEMENT SYNDROME 04/24/2009  ? ? ?Past Surgical History:  ?Procedure Laterality Date  ? CATARACT EXTRACTION, BILATERAL    ? CHOLECYSTECTOMY  1987  ? KNEE SURGERY Right   ? ? ?OB History   ? ? Gravida  ?3  ? Para  ?3  ? Term  ?3  ? Preterm  ?   ? AB  ?   ? Living  ?3  ?  ? ? SAB  ?   ? IAB  ?   ? Ectopic  ?   ? Multiple  ?   ? Live Births  ?3  ?   ?  ?  ? ? ? ?Home Medications   ? ?Prior to Admission medications   ?Medication Sig Start Date End  Date Taking? Authorizing Provider  ?mupirocin ointment (BACTROBAN) 2 % Apply 1 application. topically 2 (two) times daily. 04/11/22  Yes Leotha Voeltz-Warren, Sadie Haber, NP  ?triamcinolone ointment (KENALOG) 0.5 % Apply 1 application. topically 2 (two) times daily. 04/11/22  Yes Huel Centola-Warren, Sadie Haber, NP  ?acyclovir cream (ZOVIRAX) 5 % APPLY 1 APPLICATION TOPICALLY TWICE A DAY AS NEEDED FOR OUTBREAKS 10/23/21   Donita Brooks, MD  ?albuterol (VENTOLIN HFA) 108 (90 Base) MCG/ACT inhaler Inhale 2 puffs into the lungs every 4 (four) hours as needed for wheezing or shortness of breath. 12/15/20   Salley Scarlet, MD  ?amoxicillin (AMOXIL) 500 MG capsule Take by mouth. 01/22/22   [provider]  ?atorvastatin (LIPITOR) 40 MG tablet TAKE 1 TABLET DAILY 01/04/21   Donita Brooks, MD  ?Butalbital-APAP-Caffeine 50-300-40 MG CAPS TAKE 1 CAPSULE FOUR TIMES A DAY AS NEEDED 02/04/22   Donita Brooks, MD  ?clobetasol (TEMOVATE) 0.05 % external solution APPLY 1 APPLICATION TOPICALLY TWICE A DAY AS NEEDED, APPLY TO SCALP AS NEEDED FOR IRRITATION 02/14/22   Donita Brooks, MD  ?fluticasone  (FLONASE) 50 MCG/ACT nasal spray Place 2 sprays into both nostrils daily. 12/22/20   [place], Velna Hatchet, MD  ?Fremanezumab-vfrm (AJOVY) 225 MG/1.5ML SOAJ Inject 225 mg into the skin every 30 (thirty) days. 01/21/22   Ocie Doyne, MD  ?hydrOXYzine (ATARAX/VISTARIL) 25 MG tablet Take 1 tablet (25 mg total) by mouth 3 (three) times daily as needed. 09/06/20   Salley Scarlet, MD  ?hydrOXYzine (VISTARIL) 25 MG capsule Take 25 mg by mouth 3 (three) times daily. 01/07/22   [provider]  ?lamoTRIgine (LAMICTAL) 25 MG tablet Take 1 tablet (25 mg total) by mouth daily. 08/30/16   [place], Everardo All, FNP  ?LATUDA 80 MG TABS tablet Take 80 mg by mouth daily.  02/11/17   [provider]  ?Lidocaine, Anorectal, 5 % GEL Apply a pea-sized amount to the affected area 2 times daily. 03/18/22   Wallis Bamberg, PA-C  ?LORazepam (ATIVAN) 0.5 MG tablet Take 1 tablet (0.5 mg total) by mouth every 8 (eight) hours as needed for anxiety (itching). 09/21/20   Donita Brooks, MD  ?meclizine (ANTIVERT) 25 MG tablet TAKE 1 TABLET AS NEEDED FOR DIZZINESS 12/07/21   Donita Brooks, MD  ?methocarbamol (ROBAXIN) 500 MG tablet TAKE 1 TABLET FOUR TIMES A DAY 11/20/20   Donita Brooks, MD  ?metoprolol succinate (TOPROL-XL) 100 MG 24 hr tablet TAKE 1 TABLET AT BEDTIME WITH OR IMMEDIATELY FOLLOWING A MEAL  (CALL CLINIC TO SCHEDULE APPOINTMENT FOR FUTURE REFILLS. [phone]) 08/06/21   Donita Brooks, MD  ?montelukast (SINGULAIR) 10 MG tablet Take 1 tablet (10 mg total) by mouth at bedtime. 10/18/21   Donita Brooks, MD  ?promethazine (PHENERGAN) 25 MG tablet TAKE 1 TABLET EVERY 6 HOURS AS NEEDED FOR NAUSEA OR VOMITING 04/08/22   Donita Brooks, MD  ?rizatriptan (MAXALT) 10 MG tablet Take 1 tablet (10 mg total) by mouth as needed for migraine. May repeat in 2 hours if needed 03/27/22   Ocie Doyne, MD  ?Semaglutide-Weight Management [facility]) 0.5 MG/0.5ML SOAJ Inject 0.5 mg into the skin once a week. 01/25/22   Donita Brooks, MD  ?valACYclovir (VALTREX) 500 MG tablet TAKE 1 TABLET DAILY 11/14/21   Donita Brooks, MD  ?zolpidem (AMBIEN) 5 MG tablet Take 5 mg by mouth at bedtime as needed. 11/10/21   [provider]  ? ? ?Family History ?Family History  ?Problem  Relation Age of Onset  ? Arthritis Mother   ? Asthma Mother   ? Hearing loss Mother   ? Hyperlipidemia Mother   ? Hypertension Mother   ? Miscarriages / [place] Mother   ? Stroke Mother   ? Hearing loss Father   ? Hypertension Father   ? Stroke Father   ? Diabetes Father   ? Bell's palsy Father   ? Cancer Brother   ?     leukemia  ? Early death Brother   ? Arthritis Maternal Grandmother   ? Depression Maternal Grandmother   ? Diabetes Maternal Grandmother   ? Heart disease Maternal Grandmother   ?     CHF  ? Miscarriages / Stillbirths Maternal Grandmother   ? Vision loss Maternal Grandmother   ? Alcohol abuse Maternal Grandfather   ? Heart attack Maternal Grandfather   ? Arthritis Paternal Grandmother   ? Heart disease Paternal Grandmother   ?     massive heart attack  ? Miscarriages / Stillbirths Paternal Grandmother   ? Alcohol abuse Paternal Grandfather   ? Stroke Paternal Grandfather   ? Heart disease Paternal Grandfather   ?     arteriosclerosis  ? ? ?Social History ?Social History  ? ?Tobacco Use  ? Smoking status: Former  ?  Years: 0.50  ?  Types: Cigarettes  ? Smokeless tobacco: Never  ? Tobacco comments:  ?  Quit many years ago  ?Vaping Use  ? Vaping Use: Never used  ?Substance Use Topics  ? Alcohol use: No  ? Drug use: No  ? ? ? ?Allergies   ?Milk-related compounds and Oxycodone-acetaminophen ? ? ?Review of Systems ?Review of Systems  ?Constitutional: Negative.   ?Skin:  Positive for rash.  ?Psychiatric/Behavioral: Negative.    ? ? ?Physical Exam ?Triage Vital Signs ?ED Triage Vitals  ?Enc Vitals Group  ?   BP 04/11/22 0909 140/84  ?   Pulse Rate 04/11/22 0909 (!) 57  ?   Resp 04/11/22 0909 18  ?   Temp 04/11/22 0909 97.6 ?F (36.4 ?C)  ?   Temp  Source 04/11/22 0909 Oral  ?   SpO2 04/11/22 0909 97 %  ?   Weight 04/11/22 0910 185 lb (83.9 kg)  ?   Height 04/11/22 0910 5\' 6"  (1.676 m)  ?   Head Circumference --   ?   Peak Flow --   ?   Pain Score 05/04/2

## 2022-04-15 ENCOUNTER — Ambulatory Visit (INDEPENDENT_AMBULATORY_CARE_PROVIDER_SITE_OTHER): Payer: Medicare Other | Admitting: Obstetrics & Gynecology

## 2022-04-15 ENCOUNTER — Encounter: Payer: Self-pay | Admitting: Obstetrics & Gynecology

## 2022-04-15 VITALS — Ht 66.0 in | Wt 185.0 lb

## 2022-04-15 DIAGNOSIS — N941 Unspecified dyspareunia: Secondary | ICD-10-CM

## 2022-04-15 DIAGNOSIS — N952 Postmenopausal atrophic vaginitis: Secondary | ICD-10-CM

## 2022-04-15 MED ORDER — ESTRADIOL 0.1 MG/GM VA CREA: 1.0000 g | TOPICAL_CREAM | Freq: Every day | VAGINAL | 12 refills | Status: DC

## 2022-04-15 NOTE — Progress Notes (Signed)
? ? ? ? ?Chief Complaint  ?Patient presents with  ? vaginal dryness  ?  Painful intercourse-"feels like episiotomy scar is tearing"  ? ? ? ? ?66 y.o. G3P3003 No LMP recorded. Patient is postmenopausal. The current method of family planning is post menopausal status. ? ?Outpatient Encounter Medications as of 04/15/2022  ?Medication Sig Note  ? acyclovir cream (ZOVIRAX) 5 % APPLY 1 APPLICATION TOPICALLY TWICE A DAY AS NEEDED FOR OUTBREAKS   ? albuterol (VENTOLIN HFA) 108 (90 Base) MCG/ACT inhaler Inhale 2 puffs into the lungs every 4 (four) hours as needed for wheezing or shortness of breath.   ? atorvastatin (LIPITOR) 40 MG tablet TAKE 1 TABLET DAILY   ? Butalbital-APAP-Caffeine 50-300-40 MG CAPS TAKE 1 CAPSULE FOUR TIMES A DAY AS NEEDED   ? clobetasol (TEMOVATE) 0.05 % external solution APPLY 1 APPLICATION TOPICALLY TWICE A DAY AS NEEDED, APPLY TO SCALP AS NEEDED FOR IRRITATION   ? estradiol (ESTRACE VAGINAL) 0.1 MG/GM vaginal cream Place 1 g vaginally at bedtime.   ? fluticasone (FLONASE) 50 MCG/ACT nasal spray Place 2 sprays into both nostrils daily. 10/04/2021: As needed  ? Fremanezumab-vfrm (AJOVY) 225 MG/1.5ML SOAJ Inject 225 mg into the skin every 30 (thirty) days.   ? hydrOXYzine (ATARAX/VISTARIL) 25 MG tablet Take 1 tablet (25 mg total) by mouth 3 (three) times daily as needed.   ? lamoTRIgine (LAMICTAL) 25 MG tablet Take 1 tablet (25 mg total) by mouth daily.   ? LATUDA 80 MG TABS tablet Take 80 mg by mouth daily.    ? LORazepam (ATIVAN) 0.5 MG tablet Take 1 tablet (0.5 mg total) by mouth every 8 (eight) hours as needed for anxiety (itching).   ? lurasidone (LATUDA) 20 MG TABS tablet Take 20 mg by mouth every morning.   ? meclizine (ANTIVERT) 25 MG tablet TAKE 1 TABLET AS NEEDED FOR DIZZINESS   ? metoprolol succinate (TOPROL-XL) 100 MG 24 hr tablet TAKE 1 TABLET AT BEDTIME WITH OR IMMEDIATELY FOLLOWING A MEAL  (CALL CLINIC TO SCHEDULE APPOINTMENT FOR FUTURE REFILLS. [phone])   ? montelukast (SINGULAIR)  10 MG tablet Take 1 tablet (10 mg total) by mouth at bedtime.   ? promethazine (PHENERGAN) 25 MG tablet TAKE 1 TABLET EVERY 6 HOURS AS NEEDED FOR NAUSEA OR VOMITING   ? rizatriptan (MAXALT) 10 MG tablet Take 1 tablet (10 mg total) by mouth as needed for migraine. May repeat in 2 hours if needed   ? triamcinolone ointment (KENALOG) 0.5 % Apply 1 application. topically 2 (two) times daily.   ? valACYclovir (VALTREX) 500 MG tablet TAKE 1 TABLET DAILY   ? zolpidem (AMBIEN) 5 MG tablet Take 5 mg by mouth at bedtime as needed.   ? Lidocaine, Anorectal, 5 % GEL Apply a pea-sized amount to the affected area 2 times daily. (Patient not taking: Reported on 04/15/2022)   ? methocarbamol (ROBAXIN) 500 MG tablet TAKE 1 TABLET FOUR TIMES A DAY (Patient not taking: Reported on 04/15/2022)   ? mupirocin ointment (BACTROBAN) 2 % Apply 1 application. topically 2 (two) times daily. (Patient not taking: Reported on 04/15/2022)   ? Semaglutide-Weight Management (WEGOVY) 0.5 MG/0.5ML SOAJ Inject 0.5 mg into the skin once a week. (Patient not taking: Reported on 04/15/2022)   ? [DISCONTINUED] amoxicillin (AMOXIL) 500 MG capsule Take by mouth. (Patient not taking: Reported on 04/15/2022)   ? [DISCONTINUED] hydrOXYzine (VISTARIL) 25 MG capsule Take 25 mg by mouth 3 (three) times daily.   ? ?No facility-administered encounter medications on  file as of 04/15/2022.  ? ? ?Subjective ?Pt with 2-3 years of discomfort with intercourse that has recently made intercourse impossible ?Issue is with insertion and tearing sensation in the same are as her episiotomy ?Uses [place] warming and has for some time ? ?Past Medical History:  ?Diagnosis Date  ? AC (acromioclavicular) joint bone spurs, right   ? Allergy   ? seasonal  ? Anxiety   ? Asthma   ? asthmatic bronchitis  ? Bipolar 1 disorder, depressed (HCC)   ? Candida infection   ? Cataract 2007  ? Depression   ? Genital herpes   ? GERD (gastroesophageal reflux disease)   ? Headache   ? Herpes simplex type 2 infection    ? High cholesterol   ? Hyperlipidemia   ? Hypertension   ? Plantar fasciitis of right foot   ? Prediabetes   ? Rotator cuff tear   ? right shoulder  ? ? ?Past Surgical History:  ?Procedure Laterality Date  ? CATARACT EXTRACTION, BILATERAL    ? CHOLECYSTECTOMY  1987  ? KNEE SURGERY Right   ? ? ?OB History   ? ? Gravida  ?3  ? Para  ?3  ? Term  ?3  ? Preterm  ?   ? AB  ?   ? Living  ?3  ?  ? ? SAB  ?   ? IAB  ?   ? Ectopic  ?   ? Multiple  ?   ? Live Births  ?3  ?   ?  ?  ? ? ?Allergies  ?Allergen Reactions  ? Milk-Related Compounds Diarrhea  ?  Cannot drink plain milk, but can eat ice cream, and milk in cereal  ? Oxycodone-Acetaminophen Itching  ?  Nightmares  ? ? ?Social History  ? ?Socioeconomic History  ? Marital status: Married  ?  Spouse name: Homero FellersFrank  ? Number of children: 3  ? Years of education: Not on file  ? Highest education level: Not on file  ?Occupational History  ? Not on file  ?Tobacco Use  ? Smoking status: Former  ?  Years: 0.50  ?  Types: Cigarettes  ? Smokeless tobacco: Never  ? Tobacco comments:  ?  Quit many years ago  ?Vaping Use  ? Vaping Use: Never used  ?Substance and Sexual Activity  ? Alcohol use: No  ? Drug use: No  ? Sexual activity: Yes  ?  Birth control/protection: None  ?  Comment: spouse vasectomy  ?Other Topics Concern  ? Not on file  ?Social History Narrative  ? Lives with husband.   ? 14 grandchildren and 1 expected in 2023.  ? Caffeine- tea 1 glass  ? ?Social Determinants of Health  ? ?Financial Resource Strain: Low Risk   ? Difficulty of Paying Living Expenses: Not hard at all  ?Food Insecurity: No Food Insecurity  ? Worried About Programme researcher, broadcasting/film/videounning Out of Food in the Last Year: Never true  ? Ran Out of Food in the Last Year: Never true  ?Transportation Needs: No Transportation Needs  ? Lack of Transportation (Medical): No  ? Lack of Transportation (Non-Medical): No  ?Physical Activity: Inactive  ? Days of Exercise per Week: 0 days  ? Minutes of Exercise per Session: 0 min  ?Stress: No  Stress Concern Present  ? Feeling of Stress : Only a little  ?Social Connections: Socially Integrated  ? Frequency of Communication with Friends and Family: More than three times a week  ? Frequency  of Social Gatherings with Friends and Family: More than three times a week  ? Attends Religious Services: More than 4 times per year  ? Active Member of Clubs or Organizations: Yes  ? Attends Banker Meetings: More than 4 times per year  ? Marital Status: Married  ? ? ?Family History  ?Problem Relation Age of Onset  ? Arthritis Mother   ? Asthma Mother   ? Hearing loss Mother   ? Hyperlipidemia Mother   ? Hypertension Mother   ? Miscarriages / [place] Mother   ? Stroke Mother   ? Hearing loss Father   ? Hypertension Father   ? Stroke Father   ? Diabetes Father   ? Bell's palsy Father   ? Cancer Brother   ?     leukemia  ? Early death Brother   ? Arthritis Maternal Grandmother   ? Depression Maternal Grandmother   ? Diabetes Maternal Grandmother   ? Heart disease Maternal Grandmother   ?     CHF  ? Miscarriages / Stillbirths Maternal Grandmother   ? Vision loss Maternal Grandmother   ? Alcohol abuse Maternal Grandfather   ? Heart attack Maternal Grandfather   ? Arthritis Paternal Grandmother   ? Heart disease Paternal Grandmother   ?     massive heart attack  ? Miscarriages / Stillbirths Paternal Grandmother   ? Alcohol abuse Paternal Grandfather   ? Stroke Paternal Grandfather   ? Heart disease Paternal Grandfather   ?     arteriosclerosis  ? ? ?Medications:       ?Current Outpatient Medications:  ?  acyclovir cream (ZOVIRAX) 5 %, APPLY 1 APPLICATION TOPICALLY TWICE A DAY AS NEEDED FOR OUTBREAKS, Disp: 15 g, Rfl: 3 ?  albuterol (VENTOLIN HFA) 108 (90 Base) MCG/ACT inhaler, Inhale 2 puffs into the lungs every 4 (four) hours as needed for wheezing or shortness of breath., Disp: 18 g, Rfl: 1 ?  atorvastatin (LIPITOR) 40 MG tablet, TAKE 1 TABLET DAILY, Disp: 90 tablet, Rfl: 3 ?  Butalbital-APAP-Caffeine  50-300-40 MG CAPS, TAKE 1 CAPSULE FOUR TIMES A DAY AS NEEDED, Disp: 30 capsule, Rfl: 3 ?  clobetasol (TEMOVATE) 0.05 % external solution, APPLY 1 APPLICATION TOPICALLY TWICE A DAY AS NEEDED, APPLY TO SCALP A

## 2022-04-17 NOTE — Progress Notes (Signed)
? ?  CC:  headaches ? ?Follow-up Visit ? ?Last visit: 01/10/22 ? ?Brief HPI: ?66 year old female with a history of bipolar disorder who follows in clinic for migraines and vertigo. ? ?At her last visit she was continued on Ajovy for migraine prevention. Maxalt was started for rescue. ? ?Interval History: ?Headache severity has improved with Ajovy. She continues to have 10 migraines per month with lower level headaches at least ~5 days out of the month. She will take Tylenol and Phenergan together for her headaches. If this is not effective then Maxalt usually helps resolve her headache with the first dose. She has had to take a second one a couple of times.  ? ?She finished physical therapy. She did find this helpful. Has not had vertigo since February. ? ?Headache days per month: 15 ?Headache free days per month: 15 ? ?Current Headache Regimen: ?Preventative: Ajovy 225 mg monthly ?Abortive: Maxalt 10 mg PRN, Tylenol, Phenergan ? ? ?Prior Therapies                                  ?Amitriptyline 150 mg QHS ?Fioricet ?Lamictal 100/50 ?Metoprolol 100 mg daily ?Topamax - lack of efficacy ?Ajovy 225 mg monthly ?Zofran ?robaxin ?Meclizine ?Imitrex ?Maxalt 10 mg PRN ?Migranal ? ?Physical Exam:  ? ?Vital Signs: ?BP 120/73   Pulse (!) 57   Ht 5\' 6"  (1.676 m)   BMI 29.86 kg/m?  ?GENERAL:  well appearing, in no acute distress, alert  ?SKIN:  Color, texture, turgor normal. No rashes or lesions ?HEAD:  Normocephalic/atraumatic. ?RESP: normal respiratory effort ?MSK:  No gross joint deformities.  ? ?NEUROLOGICAL: ?Mental Status: Alert, oriented to person, place and time, Follows commands, and Speech fluent and appropriate. ?Cranial Nerves: PERRL, face symmetric, no dysarthria, hearing grossly intact ?Motor: moves all extremities equally ?Gait: normal-based. ? ?IMPRESSION: ?66 year old female with a history of bipolar disorder who presents for follow up of chronic migraines. Her migraine severity has improved with Ajovy, however  she continues to have ~15 headache days per month. Will switch to Botox for migraine prevention. Maxalt helps, but she runs out of pills each month. Sample of 79 provided today, will send in a prescription if this is effective. ? ?PLAN: ?-Prevention: Stop Ajovy. Start Botox for migraine prevention ?-Rescue: Continue Maxalt 10 mg PRN. Sample of Bernita Raisin given today, will prescribe if this is effective ?-Next steps: consider Bernita Raisin, Emgality ? ?Follow-up: for Botox ? ?I spent a total of 26 minutes on the date of the service. Headache education was done. Discussed treatment options including preventive and acute medications. Discussed medication side effects, adverse reactions and drug interactions. Written educational materials and patient instructions outlining all of the above were given. ? ?Bennie Pierini, MD ?04/18/22 ?1:57 PM ? ?

## 2022-04-18 ENCOUNTER — Ambulatory Visit (INDEPENDENT_AMBULATORY_CARE_PROVIDER_SITE_OTHER): Payer: Medicare Other | Admitting: Psychiatry

## 2022-04-18 ENCOUNTER — Encounter: Payer: Self-pay | Admitting: Psychiatry

## 2022-04-18 VITALS — BP 120/73 | HR 57 | Ht 66.0 in

## 2022-04-18 DIAGNOSIS — G43019 Migraine without aura, intractable, without status migrainosus: Secondary | ICD-10-CM

## 2022-04-18 MED ORDER — RIZATRIPTAN BENZOATE 10 MG PO TABS: 10.0000 mg | ORAL_TABLET | ORAL | 0 refills | Status: DC | PRN

## 2022-04-18 NOTE — Patient Instructions (Signed)
Start Botox for migraine prevention ?Try Bernita Raisin as needed for migraine. Take one pill at onset of migraine, may repeat a dose in 2 hours if headache persists. ?

## 2022-04-24 ENCOUNTER — Encounter: Payer: Self-pay | Admitting: Family Medicine

## 2022-04-25 ENCOUNTER — Other Ambulatory Visit: Payer: Self-pay | Admitting: Family Medicine

## 2022-04-25 MED ORDER — CLOTRIMAZOLE-BETAMETHASONE 1-0.05 % EX CREA
1.0000 | TOPICAL_CREAM | Freq: Two times a day (BID) | CUTANEOUS | 0 refills | Status: DC
Start: 2022-04-25 — End: 2023-06-24

## 2022-05-02 ENCOUNTER — Telehealth: Payer: Self-pay | Admitting: Psychiatry

## 2022-05-02 ENCOUNTER — Other Ambulatory Visit: Payer: Self-pay | Admitting: Family Medicine

## 2022-05-02 NOTE — Telephone Encounter (Signed)
Completed PA form for Botox, faxed PA with OV notes to [employer].

## 2022-05-02 NOTE — Telephone Encounter (Signed)
Last filled 11/07/21 Last ov 01/25/22

## 2022-05-07 ENCOUNTER — Other Ambulatory Visit: Payer: Self-pay | Admitting: *Deleted

## 2022-05-09 MED ORDER — ESTRADIOL 0.1 MG/GM VA CREA
1.0000 g | TOPICAL_CREAM | Freq: Every day | VAGINAL | 3 refills | Status: DC
Start: 2022-05-09 — End: 2022-08-29

## 2022-05-13 ENCOUNTER — Other Ambulatory Visit: Payer: Self-pay | Admitting: Psychiatry

## 2022-05-13 ENCOUNTER — Encounter: Payer: Self-pay | Admitting: Psychiatry

## 2022-05-13 MED ORDER — UBRELVY 100 MG PO TABS: 100.0000 mg | ORAL_TABLET | ORAL | 6 refills | Status: DC | PRN

## 2022-05-14 ENCOUNTER — Encounter: Payer: Self-pay | Admitting: *Deleted

## 2022-05-14 ENCOUNTER — Telehealth: Payer: Self-pay | Admitting: *Deleted

## 2022-05-14 NOTE — Telephone Encounter (Signed)
Bernita Raisin PA,  Key: [id number]. Immediately approved. Case [id number] Coverage Start Date:04/14/2022, Coverage End Date:11/10/2022. Sent patient my chart to advise.

## 2022-05-17 ENCOUNTER — Encounter: Payer: Self-pay | Admitting: Obstetrics & Gynecology

## 2022-05-27 DIAGNOSIS — M1711 Unilateral primary osteoarthritis, right knee: Secondary | ICD-10-CM | POA: Diagnosis not present

## 2022-05-27 DIAGNOSIS — M7542 Impingement syndrome of left shoulder: Secondary | ICD-10-CM | POA: Diagnosis not present

## 2022-05-27 DIAGNOSIS — M7541 Impingement syndrome of right shoulder: Secondary | ICD-10-CM | POA: Diagnosis not present

## 2022-06-03 ENCOUNTER — Encounter: Payer: Self-pay | Admitting: Family Medicine

## 2022-06-11 ENCOUNTER — Encounter: Payer: Self-pay | Admitting: Psychiatry

## 2022-06-12 NOTE — Telephone Encounter (Signed)
I've still been waiting to see if she's gotten Botox approval or not. It looks like a PA was submitted at the end of May. Have there been any updates on this?

## 2022-06-19 NOTE — Telephone Encounter (Signed)
Peggy Fitzgerald, Dr. Delena Bali asked me about the botox for this patient. Any update?

## 2022-06-25 NOTE — Telephone Encounter (Signed)
I called [employer] states when Medicare or other insurance is the primary payer, you will not need a PA. Per automated system.

## 2022-06-25 NOTE — Telephone Encounter (Signed)
Called pt got her scheduled for Botox appt on 07/04/2022.

## 2022-07-04 ENCOUNTER — Ambulatory Visit (INDEPENDENT_AMBULATORY_CARE_PROVIDER_SITE_OTHER): Payer: Medicare Other | Admitting: Psychiatry

## 2022-07-04 VITALS — BP 154/89 | HR 78

## 2022-07-04 DIAGNOSIS — G43719 Chronic migraine without aura, intractable, without status migrainosus: Secondary | ICD-10-CM

## 2022-07-04 DIAGNOSIS — F319 Bipolar disorder, unspecified: Secondary | ICD-10-CM | POA: Diagnosis not present

## 2022-07-04 DIAGNOSIS — F411 Generalized anxiety disorder: Secondary | ICD-10-CM | POA: Diagnosis not present

## 2022-07-04 MED ORDER — ONABOTULINUMTOXINA 200 UNITS IJ SOLR
155.0000 [IU] | Freq: Once | INTRAMUSCULAR | Status: AC
  Administered 2022-07-04: 155 [IU] via INTRAMUSCULAR

## 2022-07-04 NOTE — Progress Notes (Signed)
Botox- 100 units x 2 vials Lot: [id number] Expiration: 01/2025 NDC:0023-3921-02  Bacteriostatic 0.9% Sodium Chloride- 69mL total [id number] Expiration: 08/2023 NDC:0409-1966-02  Dx: G43.019 BB

## 2022-07-04 NOTE — Progress Notes (Signed)
[facility]  BOTULINUM TOXIN INJECTION PROCEDURE NOTE  Patient: Peggy Fitzgerald MRN: [id number]  Indication: Chronic migraines   History: 65 year old female with a history of bipolar disorder who follows in clinic for migraines and vertigo.  Last injection: First injection today  Technique: Informed consent was obtained and signed. 200 units onabotulinumtoxinA (Lot# [id number]; Expiration: 01/2025) were reconstituted using normal saline, to a concentration of 5 units per 0.42ml. 155 units were injected using sterile technique across 31 sites as follows: corrugator 10, procerus 5 units, frontalis 20 units, temporalis 40 units, occipitalis 30 units, cervical paraspinal 20 units, trapezius 30 units. 45 units were wasted. Patient tolerated procedure without complication.  Prior Therapies                                  Amitriptyline 150 mg QHS Fioricet Lamictal 100/50 Metoprolol 100 mg daily Topamax - lack of efficacy Ajovy 225 mg monthly Zofran robaxin Meclizine Imitrex Maxalt 10 mg PRN Migranal Ubrelvy 100 mg PRN  Plan: -Continue Ubrelvy for rescue -Return for Botox in 3 months  Ocie Doyne 07/04/22 3:50 PM

## 2022-07-07 ENCOUNTER — Ambulatory Visit
Admission: EM | Admit: 2022-07-07 | Discharge: 2022-07-07 | Disposition: A | Discharge status: Home / Self Care | Payer: Medicare Other | Attending: Urgent Care | Admitting: Urgent Care

## 2022-07-07 ENCOUNTER — Other Ambulatory Visit: Payer: Self-pay

## 2022-07-07 ENCOUNTER — Encounter: Payer: Self-pay | Admitting: Emergency Medicine

## 2022-07-07 DIAGNOSIS — L304 Erythema intertrigo: Secondary | ICD-10-CM | POA: Diagnosis not present

## 2022-07-07 MED ORDER — CEPHALEXIN 500 MG PO CAPS: 500.0000 mg | ORAL_CAPSULE | Freq: Three times a day (TID) | ORAL | 0 refills | Status: DC

## 2022-07-07 MED ORDER — FLUCONAZOLE 150 MG PO TABS: 150.0000 mg | ORAL_TABLET | Freq: Once | ORAL | 0 refills | Status: AC

## 2022-07-07 MED ORDER — KETOCONAZOLE 2 % EX CREA
1.0000 | TOPICAL_CREAM | Freq: Two times a day (BID) | CUTANEOUS | 1 refills | Status: DC | PRN
Start: 2022-07-07 — End: 2024-10-06

## 2022-07-07 NOTE — ED Provider Notes (Signed)
[facility]   MRN: [id number] DOB: January 09, 1956  Subjective:   Peggy Fitzgerald is a 66 y.o. female presenting for 1 week history of recurrent flareup of her intertrigo.  Patient has been using the steroid creams previously prescribed.  Feels like there are areas that have crusted over and come off of her skin.  No frank drainage of pus or bleeding.  The area is very painful, stinging.  Has not seen a skin specialist.  No current facility-administered medications for this encounter.  Current Outpatient Medications:    acyclovir cream (ZOVIRAX) 5 %, APPLY 1 APPLICATION TOPICALLY TWICE A DAY AS NEEDED FOR OUTBREAKS, Disp: 15 g, Rfl: 3   albuterol (VENTOLIN HFA) 108 (90 Base) MCG/ACT inhaler, Inhale 2 puffs into the lungs every 4 (four) hours as needed for wheezing or shortness of breath., Disp: 18 g, Rfl: 1   atorvastatin (LIPITOR) 40 MG tablet, TAKE 1 TABLET DAILY, Disp: 90 tablet, Rfl: 3   clobetasol (TEMOVATE) 0.05 % external solution, APPLY 1 APPLICATION TOPICALLY TWICE A DAY AS NEEDED, APPLY TO SCALP AS NEEDED FOR IRRITATION, Disp: 150 mL, Rfl: 3   clotrimazole-betamethasone (LOTRISONE) cream, Apply 1 application. topically 2 (two) times daily. (Patient taking differently: Apply 1 application  topically as needed.), Disp: 45 g, Rfl: 0   estradiol (ESTRACE VAGINAL) 0.1 MG/GM vaginal cream, Place 1 g vaginally at bedtime., Disp: 42.5 g, Rfl: 3   fluticasone (FLONASE) 50 MCG/ACT nasal spray, Place 2 sprays into both nostrils daily. (Patient taking differently: Place 2 sprays into both nostrils as needed.), Disp: 16 g, Rfl: 6   hydrOXYzine (ATARAX/VISTARIL) 25 MG tablet, Take 1 tablet (25 mg total) by mouth 3 (three) times daily as needed., Disp: , Rfl:    lamoTRIgine (LAMICTAL) 25 MG tablet, Take 1 tablet (25 mg total) by mouth daily., Disp: 14 tablet, Rfl: 0   LATUDA 80 MG TABS tablet, Take 80 mg by mouth daily. , Disp: , Rfl:    LORazepam (ATIVAN) 0.5 MG tablet, Take 1  tablet (0.5 mg total) by mouth every 8 (eight) hours as needed for anxiety (itching)., Disp: 30 tablet, Rfl: 0   lurasidone (LATUDA) 20 MG TABS tablet, Take 20 mg by mouth every morning., Disp: , Rfl:    meclizine (ANTIVERT) 25 MG tablet, TAKE 1 TABLET AS NEEDED FOR DIZZINESS, Disp: 90 tablet, Rfl: 3   methocarbamol (ROBAXIN) 500 MG tablet, TAKE 1 TABLET FOUR TIMES A DAY, Disp: 90 tablet, Rfl: 14   metoprolol succinate (TOPROL-XL) 100 MG 24 hr tablet, TAKE 1 TABLET AT BEDTIME WITH OR IMMEDIATELY FOLLOWING A MEAL  (CALL CLINIC TO SCHEDULE APPOINTMENT FOR FUTURE REFILLS. [phone]), Disp: 90 tablet, Rfl: 3   montelukast (SINGULAIR) 10 MG tablet, Take 1 tablet (10 mg total) by mouth at bedtime., Disp: 30 tablet, Rfl: 3   mupirocin ointment (BACTROBAN) 2 %, Apply 1 application. topically 2 (two) times daily., Disp: 30 g, Rfl: 0   promethazine (PHENERGAN) 25 MG tablet, TAKE 1 TABLET EVERY 6 HOURS AS NEEDED FOR NAUSEA OR VOMITING, Disp: 90 tablet, Rfl: 14   rizatriptan (MAXALT) 10 MG tablet, Take 1 tablet (10 mg total) by mouth as needed for migraine. May repeat in 2 hours if needed, Disp: 30 tablet, Rfl: 0   triamcinolone ointment (KENALOG) 0.5 %, Apply 1 application. topically 2 (two) times daily., Disp: 45 g, Rfl: 0   Ubrogepant (UBRELVY) 100 MG TABS, Take 100 mg by mouth as needed (migraine). May repeat a dose in 2 hours if  headache persists. Max dose 2 pills in 24 hours, Disp: 16 tablet, Rfl: 6   valACYclovir (VALTREX) 500 MG tablet, TAKE 1 TABLET DAILY, Disp: 90 tablet, Rfl: 3   zolpidem (AMBIEN) 5 MG tablet, Take 5 mg by mouth at bedtime as needed., Disp: , Rfl:    Allergies  Allergen Reactions   Hydrocodone-Acetaminophen Other (See Comments)    Headaches on long term use   Milk-Related Compounds Diarrhea    Cannot drink plain milk, but can eat ice cream, and milk in cereal   Oxycodone-Acetaminophen Itching    Nightmares    Past Medical History:  Diagnosis Date   AC (acromioclavicular)  joint bone spurs, right    Allergy    seasonal   Anxiety    Asthma    asthmatic bronchitis   Bipolar 1 disorder, depressed (HCC)    Candida infection    Cataract 2007   Depression    Genital herpes    GERD (gastroesophageal reflux disease)    Headache    Herpes simplex type 2 infection    High cholesterol    Hyperlipidemia    Hypertension    Migraine    Plantar fasciitis of right foot    Prediabetes    Rotator cuff tear    right shoulder     Past Surgical History:  Procedure Laterality Date   CATARACT EXTRACTION, BILATERAL     CHOLECYSTECTOMY  1987   KNEE SURGERY Right     Family History  Problem Relation Age of Onset   Arthritis Mother    Asthma Mother    Hearing loss Mother    Hyperlipidemia Mother    Hypertension Mother    Miscarriages / [place] Mother    Stroke Mother    Hearing loss Father    Hypertension Father    Stroke Father    Diabetes Father    Bell's palsy Father    Cancer Brother        leukemia   Early death Brother    Arthritis Maternal Grandmother    Depression Maternal Grandmother    Diabetes Maternal Grandmother    Heart disease Maternal Grandmother        CHF   Miscarriages / Stillbirths Maternal Grandmother    Vision loss Maternal Grandmother    Alcohol abuse Maternal Grandfather    Heart attack Maternal Grandfather    Arthritis Paternal Grandmother    Heart disease Paternal Grandmother        massive heart attack   Miscarriages / Stillbirths Paternal Grandmother    Alcohol abuse Paternal Grandfather    Stroke Paternal Grandfather    Heart disease Paternal Grandfather        arteriosclerosis    Social History   Tobacco Use   Smoking status: Former    Years: 0.50    Types: Cigarettes   Smokeless tobacco: Never   Tobacco comments:    Quit many years ago  Advertising account planner   Vaping Use: Never used  Substance Use Topics   Alcohol use: No   Drug use: No    ROS   Objective:   Vitals: BP (!) 174/83 (BP Location: Right  Arm)   Pulse 73   Temp 98.1 F (36.7 C) (Oral)   Resp 18   SpO2 96%   Physical Exam Constitutional:      General: [name] not in acute distress.    Appearance: Normal appearance. [name] well-developed. [name] not ill-appearing, toxic-appearing or diaphoretic.  HENT:  Head: Normocephalic and atraumatic.     Nose: Nose normal.     Mouth/Throat:     Mouth: Mucous membranes are moist.  Eyes:     General: No scleral icterus.       Right eye: No discharge.        Left eye: No discharge.     Extraocular Movements: Extraocular movements intact.  Cardiovascular:     Rate and Rhythm: Normal rate.  Pulmonary:     Effort: Pulmonary effort is normal.  Skin:    General: Skin is warm and dry.       Neurological:     General: No focal deficit present.     Mental Status: [name] alert and oriented to person, place, and time.  Psychiatric:        Mood and Affect: Mood normal.        Behavior: Behavior normal.        Assessment and Plan :   PDMP not reviewed this encounter.  1. Intertrigo    We will hold off on further steroid use and instead use an antifungal cream as this is quite common with intertrigo.  Recommended loading dose of fluconazole followed by topical ketoconazole for 2 weeks.  I did provide her with a refill just in case.  Also advised that she may have a secondary skin infection and she is to fill a prescription for Keflex should the symptoms persist despite the use of antifungals.  Follow-up with PCP otherwise.  Counseled patient on potential for adverse effects with medications prescribed/recommended today, ER and return-to-clinic precautions discussed, patient verbalized understanding.    Wallis Bamberg, PA-C 07/07/22 1246

## 2022-07-07 NOTE — ED Triage Notes (Signed)
Pt reports history of similar and reports most recent flare-up is under belly and reports "has been draining."

## 2022-07-16 ENCOUNTER — Ambulatory Visit: Payer: TRICARE For Life (TFL) | Admitting: Obstetrics & Gynecology

## 2022-07-18 ENCOUNTER — Encounter: Payer: Self-pay | Admitting: Psychiatry

## 2022-07-22 ENCOUNTER — Other Ambulatory Visit: Payer: Self-pay | Admitting: *Deleted

## 2022-07-22 DIAGNOSIS — G43019 Migraine without aura, intractable, without status migrainosus: Secondary | ICD-10-CM

## 2022-07-22 MED ORDER — RIZATRIPTAN BENZOATE 10 MG PO TABS: 10.0000 mg | ORAL_TABLET | ORAL | 0 refills | Status: DC | PRN

## 2022-07-23 ENCOUNTER — Ambulatory Visit: Payer: TRICARE For Life (TFL) | Admitting: Obstetrics & Gynecology

## 2022-07-30 ENCOUNTER — Other Ambulatory Visit: Payer: Self-pay | Admitting: Obstetrics & Gynecology

## 2022-08-27 ENCOUNTER — Ambulatory Visit: Payer: TRICARE For Life (TFL) | Admitting: Obstetrics & Gynecology

## 2022-09-02 ENCOUNTER — Encounter: Payer: Self-pay | Admitting: Family Medicine

## 2022-09-09 ENCOUNTER — Encounter: Payer: Self-pay | Admitting: Obstetrics & Gynecology

## 2022-09-09 ENCOUNTER — Ambulatory Visit (INDEPENDENT_AMBULATORY_CARE_PROVIDER_SITE_OTHER): Payer: Medicare Other | Admitting: Obstetrics & Gynecology

## 2022-09-09 VITALS — BP 128/90 | HR 69

## 2022-09-09 DIAGNOSIS — N952 Postmenopausal atrophic vaginitis: Secondary | ICD-10-CM | POA: Diagnosis not present

## 2022-09-09 DIAGNOSIS — B3731 Acute candidiasis of vulva and vagina: Secondary | ICD-10-CM | POA: Diagnosis not present

## 2022-09-09 DIAGNOSIS — N941 Unspecified dyspareunia: Secondary | ICD-10-CM | POA: Diagnosis not present

## 2022-09-09 MED ORDER — FLUCONAZOLE 150 MG PO TABS
ORAL_TABLET | ORAL | 1 refills | Status: DC
Start: 2022-09-09 — End: 2022-12-23

## 2022-09-09 NOTE — Progress Notes (Signed)
Follow up appointment for response: Vaginal estrogen therapy  Chief Complaint  Patient presents with   Follow-up    On estrace    Blood pressure (!) 128/90, pulse 69.    Pt feels quite a bit better on the vaginal estrgoen nightly She has not really put it to the ultimate test.  On exam her tissue is pink moist with redundancy I did the speculum exam without causing any discomfort which is a big change  She does have some complicated issues as it relates to sex, more than just tissue response We talked about that for some time Hopefully we have given her a framework for moving forward  Exam +yeast( was on abx x2 for infected tooth that was extracted)  MEDS ordered this encounter: Meds ordered this encounter  Medications   fluconazole (DIFLUCAN) 150 MG tablet    Sig: Take 1 tablet today and repeat in 3 days    Dispense:  2 tablet    Refill:  1    Orders for this encounter: No orders of the defined types were placed in this encounter.   Impression + Management Plan   ICD-10-CM   1. Vaginal atrophy: improved on nightly estrogen cream  N95.2     2. Dyspareunia, female  N94.10    Has not taken for a test yet    3. Yeast vaginitis  B37.31    Diflcuan treatment      Follow Up: Return in about 6 months (around 03/11/2023) for Follow up, with Dr Elonda Husky.     All questions were answered.  Past Medical History:  Diagnosis Date   AC (acromioclavicular) joint bone spurs, right    Allergy    seasonal   Anxiety    Asthma    asthmatic bronchitis   Bipolar 1 disorder, depressed ([place])    Candida infection    Cataract 2007   Depression    Genital herpes    GERD (gastroesophageal reflux disease)    Headache    Herpes simplex type 2 infection    High cholesterol    Hyperlipidemia    Hypertension    Migraine    Plantar fasciitis of right foot    Prediabetes    Rotator cuff tear    right shoulder    Past Surgical History:  Procedure Laterality Date   CATARACT  EXTRACTION, BILATERAL     CHOLECYSTECTOMY  1987   KNEE SURGERY Right     OB History     Gravida  3   Para  3   Term  3   Preterm      AB      Living  3      SAB      IAB      Ectopic      Multiple      Live Births  3           Allergies  Allergen Reactions   Hydrocodone-Acetaminophen Other (See Comments)    Headaches on long term use   Milk-Related Compounds Diarrhea    Cannot drink plain milk, but can eat ice cream, and milk in cereal   Oxycodone-Acetaminophen Itching    Nightmares    Social History   Socioeconomic History   Marital status: Married    Spouse name: Pilar Plate   Number of children: 3   Years of education: Not on file   Highest education level: Not on file  Occupational History   Not on file  Tobacco Use  Smoking status: Former    Years: 0.50    Types: Cigarettes   Smokeless tobacco: Never   Tobacco comments:    Quit many years ago  Vaping Use   Vaping Use: Never used  Substance and Sexual Activity   Alcohol use: No   Drug use: No   Sexual activity: Yes    Birth control/protection: None    Comment: spouse vasectomy  Other Topics Concern   Not on file  Social History Narrative   Lives with husband.    14 grandchildren and 1 expected in 2023.   Caffeine- tea 1 glass   Social Determinants of Health   Financial Resource Strain: Low Risk  (10/26/2021)   Overall Financial Resource Strain (CARDIA)    Difficulty of Paying Living Expenses: Not hard at all  Food Insecurity: No Food Insecurity (10/26/2021)   Hunger Vital Sign    Worried About Running Out of Food in the Last Year: Never true    Ran Out of Food in the Last Year: Never true  Transportation Needs: No Transportation Needs (10/26/2021)   PRAPARE - Administrator, Civil Service (Medical): No    Lack of Transportation (Non-Medical): No  Physical Activity: Inactive (10/26/2021)   Exercise Vital Sign    Days of Exercise per Week: 0 days    Minutes of  Exercise per Session: 0 min  Stress: No Stress Concern Present (10/26/2021)   [employer] of Occupational Health - Occupational Stress Questionnaire    Feeling of Stress : Only a little  Social Connections: Socially Integrated (10/26/2021)   Social Connection and Isolation Panel [NHANES]    Frequency of Communication with Friends and Family: More than three times a week    Frequency of Social Gatherings with Friends and Family: More than three times a week    Attends Religious Services: More than 4 times per year    Active Member of [employer] or Organizations: Yes    Attends Engineer, structural: More than 4 times per year    Marital Status: Married    Family History  Problem Relation Age of Onset   Arthritis Mother    Asthma Mother    Hearing loss Mother    Hyperlipidemia Mother    Hypertension Mother    Miscarriages / [place] Mother    Stroke Mother    Hearing loss Father    Hypertension Father    Stroke Father    Diabetes Father    Bell's palsy Father    Cancer Brother        leukemia   Early death Brother    Arthritis Maternal Grandmother    Depression Maternal Grandmother    Diabetes Maternal Grandmother    Heart disease Maternal Grandmother        CHF   Miscarriages / Stillbirths Maternal Grandmother    Vision loss Maternal Grandmother    Alcohol abuse Maternal Grandfather    Heart attack Maternal Grandfather    Arthritis Paternal Grandmother    Heart disease Paternal Grandmother        massive heart attack   Miscarriages / Stillbirths Paternal Grandmother    Alcohol abuse Paternal Grandfather    Stroke Paternal Grandfather    Heart disease Paternal Grandfather        arteriosclerosis

## 2022-09-25 ENCOUNTER — Telehealth: Payer: Self-pay | Admitting: Family Medicine

## 2022-09-25 NOTE — Telephone Encounter (Signed)
Left message on voicemail to request call back; need to reschedule Medicare AWV appt with Amber.   

## 2022-09-25 NOTE — Progress Notes (Unsigned)
[facility]  BOTULINUM TOXIN INJECTION PROCEDURE NOTE  Patient: Peggy Fitzgerald MRN: [id number]  Indication: Chronic migraines   History: 66 year old female with a history of bipolar disorder who follows in clinic for migraines. Headaches***  Last injection: 07/04/22 (second injection today)  Technique: Informed consent was obtained and signed. *** onabotulinumtoxinA (Lot# ***; Expiration: ***) were reconstituted using normal saline, to a concentration of 5 units per 0.30ml. 155 units were injected using sterile technique across 31 sites as follows: corrugator 10, procerus 5 units, frontalis 20 units, temporalis 40 units, occipitalis 30 units, cervical paraspinal 20 units, trapezius 30 units. 45 units were wasted. Patient tolerated procedure without complication.  Prior Therapies                                  Preventive: Amitriptyline 150 mg QHS Lamictal 100/50 Metoprolol 100 mg daily Topamax - lack of efficacy Ajovy 225 mg monthly Botox  Rescue: Fioricet Zofran robaxin Meclizine Imitrex Maxalt 10 mg PRN Migranal Ubrelvy 100 mg PRN  Plan: -Rescue: Continue Ubrelvy 100 mg PRN*** -Return for Botox in 3 months

## 2022-09-26 ENCOUNTER — Ambulatory Visit (INDEPENDENT_AMBULATORY_CARE_PROVIDER_SITE_OTHER): Payer: Medicare Other | Admitting: Psychiatry

## 2022-09-26 VITALS — BP 150/90 | HR 58

## 2022-09-26 DIAGNOSIS — G43719 Chronic migraine without aura, intractable, without status migrainosus: Secondary | ICD-10-CM

## 2022-09-26 DIAGNOSIS — G43019 Migraine without aura, intractable, without status migrainosus: Secondary | ICD-10-CM

## 2022-09-26 MED ORDER — DICLOFENAC POTASSIUM 50 MG PO TABS: ORAL_TABLET | ORAL | 4 refills | Status: DC

## 2022-09-26 MED ORDER — RIZATRIPTAN BENZOATE 10 MG PO TABS: 10.0000 mg | ORAL_TABLET | ORAL | 4 refills | Status: DC | PRN

## 2022-09-26 MED ORDER — DICLOFENAC POTASSIUM 50 MG PO TABS: ORAL_TABLET | ORAL | 0 refills | Status: DC

## 2022-09-26 MED ORDER — ONABOTULINUMTOXINA 200 UNITS IJ SOLR
155.0000 [IU] | Freq: Once | INTRAMUSCULAR | Status: AC
  Administered 2022-09-26: 155 [IU] via INTRAMUSCULAR

## 2022-09-26 MED ORDER — RIZATRIPTAN BENZOATE 10 MG PO TABS: 10.0000 mg | ORAL_TABLET | ORAL | 0 refills | Status: DC | PRN

## 2022-09-26 MED ORDER — DICLOFENAC POTASSIUM 50 MG PO TABS: 50.0000 mg | ORAL_TABLET | Freq: Three times a day (TID) | ORAL | 0 refills | Status: DC

## 2022-09-26 NOTE — Progress Notes (Signed)
Botox- 200 units x  1vial Lot: [id number] Expiration: 01/2025 NDC: 4496-7591-63   Bacteriostatic 0.9% Sodium Chloride- 23mL total [id number] Expiration: 08/2023 NDC:0409-1966-02   Dx: T01.779 BB

## 2022-10-03 DIAGNOSIS — F411 Generalized anxiety disorder: Secondary | ICD-10-CM | POA: Diagnosis not present

## 2022-10-03 DIAGNOSIS — F319 Bipolar disorder, unspecified: Secondary | ICD-10-CM | POA: Diagnosis not present

## 2022-10-10 DIAGNOSIS — M25512 Pain in left shoulder: Secondary | ICD-10-CM | POA: Diagnosis not present

## 2022-10-10 DIAGNOSIS — M25561 Pain in right knee: Secondary | ICD-10-CM | POA: Diagnosis not present

## 2022-10-10 DIAGNOSIS — M25511 Pain in right shoulder: Secondary | ICD-10-CM | POA: Diagnosis not present

## 2022-10-28 ENCOUNTER — Encounter: Payer: Self-pay | Admitting: Psychiatry

## 2022-10-28 MED ORDER — KETOROLAC TROMETHAMINE 10 MG PO TABS: 10.0000 mg | ORAL_TABLET | Freq: Three times a day (TID) | ORAL | 0 refills | Status: AC

## 2022-10-28 NOTE — Telephone Encounter (Signed)
Looks like she had steroids earlier this month for some joint pain. Instead I'll send in a 5 day course of Toradol to try to break her headache. She should avoid taking other NSAIDs like ibuprofen or diclofenac while taking the Toradol

## 2022-10-29 ENCOUNTER — Telehealth: Payer: Self-pay | Admitting: Neurology

## 2022-10-29 NOTE — Telephone Encounter (Signed)
PA completed on [facility] for the pt. KEY: [id number] Will await response

## 2022-10-30 NOTE — Telephone Encounter (Signed)
Approved for the pt  CaseId:[id number];Status:Approved;Review Type:Prior Auth;Coverage Start Date:09/29/2022;Coverage End Date:12/08/2098;

## 2022-11-01 ENCOUNTER — Other Ambulatory Visit: Payer: Self-pay | Admitting: Family Medicine

## 2022-11-04 ENCOUNTER — Other Ambulatory Visit: Payer: Self-pay | Admitting: Family Medicine

## 2022-11-04 NOTE — Telephone Encounter (Signed)
PT NEEDs annual APPT W/PCP FOR FUTURE REFILLS

## 2022-11-06 ENCOUNTER — Encounter: Payer: Self-pay | Admitting: Psychiatry

## 2022-11-07 NOTE — Telephone Encounter (Signed)
Left message to return call 

## 2022-11-11 ENCOUNTER — Other Ambulatory Visit: Payer: Self-pay | Admitting: Psychiatry

## 2022-11-13 ENCOUNTER — Encounter: Payer: Self-pay | Admitting: Emergency Medicine

## 2022-11-13 ENCOUNTER — Ambulatory Visit
Admission: EM | Admit: 2022-11-13 | Discharge: 2022-11-13 | Disposition: A | Discharge status: Home / Self Care | Payer: Medicare Other | Attending: Family Medicine | Admitting: Family Medicine

## 2022-11-13 DIAGNOSIS — G43809 Other migraine, not intractable, without status migrainosus: Secondary | ICD-10-CM | POA: Diagnosis not present

## 2022-11-13 DIAGNOSIS — N760 Acute vaginitis: Secondary | ICD-10-CM

## 2022-11-13 DIAGNOSIS — R110 Nausea: Secondary | ICD-10-CM

## 2022-11-13 MED ORDER — FLUCONAZOLE 150 MG PO TABS: 150.0000 mg | ORAL_TABLET | ORAL | 0 refills | Status: DC

## 2022-11-13 MED ORDER — ONDANSETRON 4 MG PO TBDP: 4.0000 mg | ORAL_TABLET | Freq: Three times a day (TID) | ORAL | 0 refills | Status: AC | PRN

## 2022-11-13 MED ORDER — KETOROLAC TROMETHAMINE 30 MG/ML IJ SOLN
30.0000 mg | Freq: Once | INTRAMUSCULAR | Status: AC
  Administered 2022-11-13: 30 mg via INTRAMUSCULAR

## 2022-11-13 NOTE — ED Provider Notes (Signed)
[facility]    CSN: [id number] Arrival date & time: 11/13/22  1146      History   Chief Complaint No chief complaint on file.   HPI Peggy Fitzgerald is a 66 y.o. female.   Patient presenting today with 1 week history of white discharge, vaginal itching and vaginal irritation.  Has tried over-the-counter vaginal pain with minimal relief.  Denies any concern for STDs, and no urinary symptoms, fever, chills, pelvic or abdominal pain.  Also having 2 weeks of ongoing waxing waning migraine associated with nausea.  She states she typically can take her migraine medication and Phenergan and get migraines to resolve but this 1 has been ongoing, particularly nausea.  Denies visual change, recent head injury, mental status changes, weakness numbness or tingling.    Past Medical History:  Diagnosis Date   AC (acromioclavicular) joint bone spurs, right    Allergy    seasonal   Anxiety    Asthma    asthmatic bronchitis   Bipolar 1 disorder, depressed ([place])    Candida infection    Cataract 2007   Depression    Genital herpes    GERD (gastroesophageal reflux disease)    Headache    Herpes simplex type 2 infection    High cholesterol    Hyperlipidemia    Hypertension    Migraine    Plantar fasciitis of right foot    Prediabetes    Rotator cuff tear    right shoulder    Patient Active Problem List   Diagnosis Date Noted   Right knee pain 10/18/2021   Obesity (BMI 30-39.9) 08/23/2021   Migraine 05/21/2018   Low back pain without sciatica 07/01/2017   Urinary tract infection without hematuria 07/01/2017   Vaginal dryness 07/01/2017   Urinary frequency 07/01/2017   Bipolar 1 disorder, mixed, moderate ([place]) 08/28/2016   Insomnia 12/18/2015   Headache 12/18/2015   Vertigo 12/18/2015   Allergy    Anxiety    Asthma    Depression    Prediabetes    GERD (gastroesophageal reflux disease)    Hyperlipidemia    Hypertension    Herpes simplex type 2 infection     SHOULDER PAIN 04/24/2009   IMPINGEMENT SYNDROME 04/24/2009    Past Surgical History:  Procedure Laterality Date   CATARACT EXTRACTION, BILATERAL     CHOLECYSTECTOMY  1987   KNEE SURGERY Right     OB History     Gravida  3   Para  3   Term  3   Preterm      AB      Living  3      SAB      IAB      Ectopic      Multiple      Live Births  3            Home Medications    Prior to Admission medications   Medication Sig Start Date End Date Taking? Authorizing Provider  fluconazole (DIFLUCAN) 150 MG tablet Take 1 tablet (150 mg total) by mouth every other day. 11/13/22  Yes Volney American, PA-C  ondansetron (ZOFRAN-ODT) 4 MG disintegrating tablet Take 1 tablet (4 mg total) by mouth every 8 (eight) hours as needed for nausea or vomiting. 11/13/22  Yes Volney American, PA-C  acyclovir cream (ZOVIRAX) 5 % APPLY 1 APPLICATION TOPICALLY TWICE A DAY AS NEEDED FOR OUTBREAKS 10/23/21   Susy Frizzle, MD  albuterol (VENTOLIN HFA)  108 (90 Base) MCG/ACT inhaler Inhale 2 puffs into the lungs every 4 (four) hours as needed for wheezing or shortness of breath. 12/15/20   Alycia Rossetti, MD  atorvastatin (LIPITOR) 40 MG tablet TAKE 1 TABLET DAILY 01/04/21   Susy Frizzle, MD  cephALEXin (KEFLEX) 500 MG capsule Take 1 capsule (500 mg total) by mouth 3 (three) times daily. 07/07/22   Jaynee Eagles, PA-C  clobetasol (TEMOVATE) 0.05 % external solution APPLY 1 APPLICATION TOPICALLY TWICE A DAY AS NEEDED, APPLY TO SCALP AS NEEDED FOR IRRITATION 02/14/22   Susy Frizzle, MD  clotrimazole-betamethasone (LOTRISONE) cream Apply 1 application. topically 2 (two) times daily. 04/25/22   Susy Frizzle, MD  diclofenac (CATAFLAM) 50 MG tablet Take 50-100 mg at onset of migraine. Max dose 2 pills in 24 hours 09/26/22   Genia Harold, MD  estradiol (ESTRACE) 0.1 MG/GM vaginal cream PLACE 1 GRAM VAGINALLY AT BEDTIME 08/29/22   Florian Buff, MD  fluconazole (DIFLUCAN) 150  MG tablet Take 1 tablet today and repeat in 3 days 09/09/22   Florian Buff, MD  fluticasone [facility]) 50 MCG/ACT nasal spray Place 2 sprays into both nostrils daily. Patient taking differently: Place 2 sprays into both nostrils as needed. 12/22/20   Alycia Rossetti, MD  hydrOXYzine (ATARAX/VISTARIL) 25 MG tablet Take 1 tablet (25 mg total) by mouth 3 (three) times daily as needed. 09/06/20   Emmet, Modena Nunnery, MD  ketoconazole (NIZORAL) 2 % cream Apply 1 Application topically 2 (two) times daily as needed for irritation. 07/07/22   Jaynee Eagles, PA-C  lamoTRIgine (LAMICTAL) 25 MG tablet Take 1 tablet (25 mg total) by mouth daily. 08/30/16   Withrow, John C, FNP  LATUDA 80 MG TABS tablet Take 80 mg by mouth daily.  02/11/17   [provider]  LORazepam (ATIVAN) 0.5 MG tablet Take 1 tablet (0.5 mg total) by mouth every 8 (eight) hours as needed for anxiety (itching). 09/21/20   Susy Frizzle, MD  lurasidone (LATUDA) 20 MG TABS tablet Take 20 mg by mouth every morning. 04/03/22   [provider]  meclizine (ANTIVERT) 25 MG tablet TAKE 1 TABLET AS NEEDED FOR DIZZINESS 12/07/21   Susy Frizzle, MD  methocarbamol (ROBAXIN) 500 MG tablet TAKE 1 TABLET FOUR TIMES A DAY 05/03/22   Susy Frizzle, MD  metoprolol succinate (TOPROL-XL) 100 MG 24 hr tablet TAKE 1 TABLET AT BEDTIME WITH OR IMMEDIATELY FOLLOWING A MEAL  (CALL CLINIC TO SCHEDULE APPOINTMENT FOR FUTURE REFILLS. [phone]) 08/06/21   Susy Frizzle, MD  montelukast (SINGULAIR) 10 MG tablet TAKE 1 TABLET(10 MG) BY MOUTH AT BEDTIME 11/04/22   Susy Frizzle, MD  mupirocin ointment (BACTROBAN) 2 % Apply 1 application. topically 2 (two) times daily. 04/11/22   Leath-Warren, Alda Lea, NP  promethazine (PHENERGAN) 25 MG tablet TAKE 1 TABLET EVERY 6 HOURS AS NEEDED FOR NAUSEA OR VOMITING 04/08/22   Susy Frizzle, MD  rizatriptan (MAXALT) 10 MG tablet Take 1 tablet (10 mg total) by mouth as needed for migraine. May repeat in 2  hours if needed 09/26/22   Genia Harold, MD  triamcinolone ointment (KENALOG) 0.5 % Apply 1 application. topically 2 (two) times daily. 04/11/22   Leath-Warren, Alda Lea, NP  valACYclovir (VALTREX) 500 MG tablet TAKE 1 TABLET DAILY 11/14/21   Susy Frizzle, MD  zolpidem (AMBIEN) 5 MG tablet Take 6.5 mg by mouth at bedtime as needed. 6.5 11/10/21   [provider]  Family History Family History  Problem Relation Age of Onset   Arthritis Mother    Asthma Mother    Hearing loss Mother    Hyperlipidemia Mother    Hypertension Mother    Miscarriages / [place] Mother    Stroke Mother    Hearing loss Father    Hypertension Father    Stroke Father    Diabetes Father    Bell's palsy Father    Cancer Brother        leukemia   Early death Brother    Arthritis Maternal Grandmother    Depression Maternal Grandmother    Diabetes Maternal Grandmother    Heart disease Maternal Grandmother        CHF   Miscarriages / Stillbirths Maternal Grandmother    Vision loss Maternal Grandmother    Alcohol abuse Maternal Grandfather    Heart attack Maternal Grandfather    Arthritis Paternal Grandmother    Heart disease Paternal Grandmother        massive heart attack   Miscarriages / Stillbirths Paternal Grandmother    Alcohol abuse Paternal Grandfather    Stroke Paternal Grandfather    Heart disease Paternal Grandfather        arteriosclerosis    Social History Social History   Tobacco Use   Smoking status: Former    Years: 0.50    Types: Cigarettes   Smokeless tobacco: Never   Tobacco comments:    Quit many years ago  Vaping Use   Vaping Use: Never used  Substance Use Topics   Alcohol use: No   Drug use: No     Allergies   Hydrocodone-acetaminophen, Milk-related compounds, and Oxycodone-acetaminophen   Review of Systems Review of Systems Per HPI  Physical Exam Triage Vital Signs ED Triage Vitals  Enc Vitals Group     BP 11/13/22 1343 (!) 155/96      Pulse Rate 11/13/22 1343 77     Resp 11/13/22 1343 18     Temp 11/13/22 1343 97.9 F (36.6 C)     Temp Source 11/13/22 1343 Oral     SpO2 11/13/22 1343 98 %     Weight --      Height --      Head Circumference --      Peak Flow --      Pain Score 11/13/22 1344 5     Pain Loc --      Pain Edu? --      Excl. in GC? --    No data found.  Updated Vital Signs BP (!) 155/96 (BP Location: Right Arm)   Pulse 77   Temp 97.9 F (36.6 C) (Oral)   Resp 18   SpO2 98%   Visual Acuity Right Eye Distance:   Left Eye Distance:   Bilateral Distance:    Right Eye Near:   Left Eye Near:    Bilateral Near:     Physical Exam Vitals and nursing note reviewed.  Constitutional:      Appearance: Normal appearance. [name] not ill-appearing.  HENT:     Head: Atraumatic.     Mouth/Throat:     Mouth: Mucous membranes are moist.  Eyes:     Extraocular Movements: Extraocular movements intact.     Conjunctiva/sclera: Conjunctivae normal.  Cardiovascular:     Rate and Rhythm: Normal rate and regular rhythm.     Heart sounds: Normal heart sounds.  Pulmonary:     Effort: Pulmonary effort is normal.  Breath sounds: Normal breath sounds.  Abdominal:     General: Bowel sounds are normal. There is no distension.     Palpations: Abdomen is soft.     Tenderness: There is no abdominal tenderness. There is no guarding.  Genitourinary:    Comments: GU exam deferred, self swab performed Musculoskeletal:        General: Normal range of motion.     Cervical back: Normal range of motion and neck supple.  Skin:    General: Skin is warm and dry.  Neurological:     General: No focal deficit present.     Mental Status: [name] alert and oriented to person, place, and time.     Cranial Nerves: No cranial nerve deficit.     Motor: No weakness.     Gait: Gait normal.  Psychiatric:        Mood and Affect: Mood normal.        Thought Content: Thought content normal.        Judgment: Judgment normal.       UC Treatments / Results  Labs (all labs ordered are listed, but only abnormal results are displayed) Labs Reviewed  CERVICOVAGINAL ANCILLARY ONLY    EKG   Radiology No results found.  Procedures Procedures (including critical care time)  Medications Ordered in UC Medications  ketorolac (TORADOL) 30 MG/ML injection 30 mg (30 mg Intramuscular Given 11/13/22 1407)    Initial Impression / Assessment and Plan / UC Course  I have reviewed the triage vital signs and the nursing notes.  Pertinent labs & imaging results that were available during my care of the patient were reviewed by me and considered in my medical decision making (see chart for details).     Vaginal swab pending, will treat for yeast vaginitis with Diflucan while awaiting results and adjust if needed.  Discussed for migraine resolution IM Toradol, Zofran in addition to her migraine prescription and Phenergan.  Return for worsening symptoms.  No red flag findings today.  Final Clinical Impressions(s) / UC Diagnoses   Final diagnoses:  Acute vaginitis  Other migraine without status migrainosus, not intractable  Nausea   Discharge Instructions   None    ED Prescriptions     Medication Sig Dispense Auth. Provider   ondansetron (ZOFRAN-ODT) 4 MG disintegrating tablet Take 1 tablet (4 mg total) by mouth every 8 (eight) hours as needed for nausea or vomiting. 20 tablet Volney American, [place]   fluconazole (DIFLUCAN) 150 MG tablet Take 1 tablet (150 mg total) by mouth every other day. 3 tablet Volney American, [place]      PDMP not reviewed this encounter.   Volney American, [place] 11/13/22 1414

## 2022-11-13 NOTE — ED Triage Notes (Signed)
Vaginal itching x 7 days.  Small amount of white discharge without odor.  Has tried over the counter medication without relief.  Unsure of the name.

## 2022-11-14 LAB — CERVICOVAGINAL ANCILLARY ONLY
Bacterial Vaginitis (gardnerella): NEGATIVE
Candida Glabrata: NEGATIVE
Candida Vaginitis: POSITIVE — AB
Comment: NEGATIVE
Comment: NEGATIVE
Comment: NEGATIVE

## 2022-12-06 ENCOUNTER — Ambulatory Visit (INDEPENDENT_AMBULATORY_CARE_PROVIDER_SITE_OTHER): Payer: Medicare Other

## 2022-12-06 DIAGNOSIS — Z0000 Encounter for general adult medical examination without abnormal findings: Secondary | ICD-10-CM

## 2022-12-06 NOTE — Progress Notes (Signed)
Subjective:   Peggy Fitzgerald is a 66 y.o. female who presents for Medicare Annual (Subsequent) preventive examination.  Review of Systems     Cardiac Risk Factors include: advanced age (>[id number], 85>65 women)     Objective:    Today's Vitals   12/06/22 1456  PainSc: 5    There is no height or weight on file to calculate BMI.     12/06/2022    3:14 PM 12/25/2021    4:05 PM 10/26/2021    3:02 PM 07/01/2017    1:47 PM 04/03/2017   11:41 AM 08/28/2016    1:57 AM 08/27/2016    8:33 PM  Advanced Directives  Does Patient Have a Medical Advance Directive? No Yes No No No  No  Does patient want to make changes to medical advance directive?  No - Patient declined       Would patient like information on creating a medical advance directive?   No - Patient declined    No - patient declined information     Information is confidential and restricted. Go to Review Flowsheets to unlock data.    Current Medications (verified) Outpatient Encounter Medications as of 12/06/2022  Medication Sig   acyclovir cream (ZOVIRAX) 5 % APPLY 1 APPLICATION TOPICALLY TWICE A DAY AS NEEDED FOR OUTBREAKS   albuterol (VENTOLIN HFA) 108 (90 Base) MCG/ACT inhaler Inhale 2 puffs into the lungs every 4 (four) hours as needed for wheezing or shortness of breath.   atorvastatin (LIPITOR) 40 MG tablet TAKE 1 TABLET DAILY   cephALEXin (KEFLEX) 500 MG capsule Take 1 capsule (500 mg total) by mouth 3 (three) times daily.   clobetasol (TEMOVATE) 0.05 % external solution APPLY 1 APPLICATION TOPICALLY TWICE A DAY AS NEEDED, APPLY TO SCALP AS NEEDED FOR IRRITATION   clotrimazole-betamethasone (LOTRISONE) cream Apply 1 application. topically 2 (two) times daily.   diclofenac (CATAFLAM) 50 MG tablet Take 50-100 mg at onset of migraine. Max dose 2 pills in 24 hours   estradiol (ESTRACE) 0.1 MG/GM vaginal cream PLACE 1 GRAM VAGINALLY AT BEDTIME   fluconazole (DIFLUCAN) 150 MG tablet Take 1 tablet today and repeat in 3 days    fluconazole (DIFLUCAN) 150 MG tablet Take 1 tablet (150 mg total) by mouth every other day.   fluticasone (FLONASE) 50 MCG/ACT nasal spray Place 2 sprays into both nostrils daily. (Patient taking differently: Place 2 sprays into both nostrils as needed.)   hydrOXYzine (ATARAX/VISTARIL) 25 MG tablet Take 1 tablet (25 mg total) by mouth 3 (three) times daily as needed.   ketoconazole (NIZORAL) 2 % cream Apply 1 Application topically 2 (two) times daily as needed for irritation.   lamoTRIgine (LAMICTAL) 25 MG tablet Take 1 tablet (25 mg total) by mouth daily.   LATUDA 80 MG TABS tablet Take 80 mg by mouth daily.    LORazepam (ATIVAN) 0.5 MG tablet Take 1 tablet (0.5 mg total) by mouth every 8 (eight) hours as needed for anxiety (itching).   lurasidone (LATUDA) 20 MG TABS tablet Take 20 mg by mouth every morning.   meclizine (ANTIVERT) 25 MG tablet TAKE 1 TABLET AS NEEDED FOR DIZZINESS   methocarbamol (ROBAXIN) 500 MG tablet TAKE 1 TABLET FOUR TIMES A DAY   metoprolol succinate (TOPROL-XL) 100 MG 24 hr tablet TAKE 1 TABLET AT BEDTIME WITH OR IMMEDIATELY FOLLOWING A MEAL  (CALL CLINIC TO SCHEDULE APPOINTMENT FOR FUTURE REFILLS. [phone])   montelukast (SINGULAIR) 10 MG tablet TAKE 1 TABLET(10 MG) BY MOUTH AT BEDTIME  mupirocin ointment (BACTROBAN) 2 % Apply 1 application. topically 2 (two) times daily.   ondansetron (ZOFRAN-ODT) 4 MG disintegrating tablet Take 1 tablet (4 mg total) by mouth every 8 (eight) hours as needed for nausea or vomiting.   promethazine (PHENERGAN) 25 MG tablet TAKE 1 TABLET EVERY 6 HOURS AS NEEDED FOR NAUSEA OR VOMITING   rizatriptan (MAXALT) 10 MG tablet Take 1 tablet (10 mg total) by mouth as needed for migraine. May repeat in 2 hours if needed   triamcinolone ointment (KENALOG) 0.5 % Apply 1 application. topically 2 (two) times daily.   valACYclovir (VALTREX) 500 MG tablet TAKE 1 TABLET DAILY   zolpidem (AMBIEN) 5 MG tablet Take 6.5 mg by mouth at bedtime as needed. 6.5    No facility-administered encounter medications on file as of 12/06/2022.    Allergies (verified) Hydrocodone-acetaminophen, Milk-related compounds, and Oxycodone-acetaminophen   History: Past Medical History:  Diagnosis Date   AC (acromioclavicular) joint bone spurs, right    Allergy    seasonal   Anxiety    Asthma    asthmatic bronchitis   Bipolar 1 disorder, depressed (HCC)    Candida infection    Cataract 2007   Depression    Genital herpes    GERD (gastroesophageal reflux disease)    Headache    Herpes simplex type 2 infection    High cholesterol    Hyperlipidemia    Hypertension    Migraine    Plantar fasciitis of right foot    Prediabetes    Rotator cuff tear    right shoulder   Past Surgical History:  Procedure Laterality Date   CATARACT EXTRACTION, BILATERAL     CHOLECYSTECTOMY  1987   KNEE SURGERY Right    Family History  Problem Relation Age of Onset   Arthritis Mother    Asthma Mother    Hearing loss Mother    Hyperlipidemia Mother    Hypertension Mother    Miscarriages / [place] Mother    Stroke Mother    Hearing loss Father    Hypertension Father    Stroke Father    Diabetes Father    Bell's palsy Father    Cancer Brother        leukemia   Early death Brother    Arthritis Maternal Grandmother    Depression Maternal Grandmother    Diabetes Maternal Grandmother    Heart disease Maternal Grandmother        CHF   Miscarriages / Stillbirths Maternal Grandmother    Vision loss Maternal Grandmother    Alcohol abuse Maternal Grandfather    Heart attack Maternal Grandfather    Arthritis Paternal Grandmother    Heart disease Paternal Grandmother        massive heart attack   Miscarriages / Stillbirths Paternal Grandmother    Alcohol abuse Paternal Grandfather    Stroke Paternal Grandfather    Heart disease Paternal Grandfather        arteriosclerosis   Social History   Socioeconomic History   Marital status: Married    Spouse  name: Homero Fellers   Number of children: 3   Years of education: Not on file   Highest education level: Not on file  Occupational History   Not on file  Tobacco Use   Smoking status: Former    Years: 0.50    Types: Cigarettes   Smokeless tobacco: Never   Tobacco comments:    Quit many years ago  Vaping Use   Vaping Use: Never  used  Substance and Sexual Activity   Alcohol use: No   Drug use: No   Sexual activity: Yes    Birth control/protection: None    Comment: spouse vasectomy  Other Topics Concern   Not on file  Social History Narrative   Lives with husband.    14 grandchildren and 1 expected in 2023.   Caffeine- tea 1 glass   Social Determinants of Health   Financial Resource Strain: High Risk (12/06/2022)   Overall Financial Resource Strain (CARDIA)    Difficulty of Paying Living Expenses: Hard  Food Insecurity: No Food Insecurity (12/06/2022)   Hunger Vital Sign    Worried About Running Out of Food in the Last Year: Never true    Ran Out of Food in the Last Year: Never true  Transportation Needs: No Transportation Needs (12/06/2022)   PRAPARE - Administrator, Civil Service (Medical): No    Lack of Transportation (Non-Medical): No  Physical Activity: Inactive (12/06/2022)   Exercise Vital Sign    Days of Exercise per Week: 0 days    Minutes of Exercise per Session: 0 min  Stress: Stress Concern Present (12/06/2022)   [employer] of Occupational Health - Occupational Stress Questionnaire    Feeling of Stress : Rather much  Social Connections: Moderately Integrated (12/06/2022)   Social Connection and Isolation Panel [NHANES]    Frequency of Communication with Friends and Family: Three times a week    Frequency of Social Gatherings with Friends and Family: Three times a week    Attends Religious Services: 1 to 4 times per year    Active Member of Clubs or Organizations: No    Attends Banker Meetings: Never    Marital Status: Married     Tobacco Counseling Counseling given: Not Answered Tobacco comments: Quit many years ago   Clinical Intake:  Pre-visit preparation completed: Yes  Pain : 0-10 (scrubbing the bathroom/and bedroom today) Pain Score: 5  Pain Type: Chronic pain Pain Location:  (r-shoulder, lower back, knees, migrane) Pain Orientation: Right Pain Onset: Other (comment) (due to scrubbing bathroom/bedroom today) Pain Frequency: Intermittent Pain Relieving Factors: pain meds  Pain Relieving Factors: pain meds  BMI - recorded: 29.87 Nutritional Status: BMI 25 -29 Overweight Diabetes: No  How often do you need to have someone help you when you read instructions, pamphlets, or other written materials from your doctor or pharmacy?: 1 - Never What is the last grade level you completed in school?: BA  Diabetic?n/a       Activities of Daily Living    12/06/2022    3:11 PM  In your present state of health, do you have any difficulty performing the following activities:  Hearing? 0  Vision? 0  Difficulty concentrating or making decisions? 0  Walking or climbing stairs? 0  Dressing or bathing? 0  Doing errands, shopping? 0  Preparing Food and eating ? N  Using the Toilet? N  In the past six months, have you accidently leaked urine? N  Do you have problems with loss of bowel control? N  Managing your Medications? N  Managing your Finances? N  Housekeeping or managing your Housekeeping? N    Patient Care Team: Donita Brooks, MD as PCP - General (Family Medicine)  Indicate any recent Medical Services you may have received from other than [facility] providers in the past year (date may be approximate).     Assessment:   This is a routine wellness examination for  Peggy Fitzgerald.  Hearing/Vision screen No results found.  Dietary issues and exercise activities discussed:     Goals Addressed             This Visit's Progress    DIET - REDUCE CALORIE INTAKE   On track    "Take better care  of myself and lose more weight."       Depression Screen    12/06/2022    2:55 PM 10/26/2021    2:55 PM 09/07/2021   12:11 PM 04/18/2020    2:52 PM 08/06/2017    3:42 PM 04/03/2017   11:56 AM  PHQ 2/9 Scores  PHQ - 2 Score 0 0 0 0 0 6  PHQ- 9 Score      21    Fall Risk    12/06/2022    2:55 PM 10/26/2021    3:03 PM 09/07/2021   12:11 PM  Fall Risk   Falls in the past year? 0 0 0  Number falls in past yr:  0 0  Injury with Fall?  0 0  Risk for fall due to :  No Fall Risks No Fall Risks  Follow up  Falls prevention discussed Falls evaluation completed    FALL RISK PREVENTION PERTAINING TO THE HOME:  Any stairs in or around the home? No  If so, are there any without handrails? No  Home free of loose throw rugs in walkways, pet beds, electrical cords, etc? Yes  Adequate lighting in your home to reduce risk of falls? Yes   ASSISTIVE DEVICES UTILIZED TO PREVENT FALLS:  Life alert? No  Use of a cane, walker or w/c? No  Grab bars in the bathroom? No  Shower chair or bench in shower? No  Elevated toilet seat or a handicapped toilet? Yes   TIMED UP AND GO:  Was the test performed? No .  Length of time to ambulate 10 feet: n/a sec.     Cognitive Function:        12/06/2022    3:09 PM 10/26/2021    3:06 PM  6CIT Screen  What Year? 0 points 0 points  What month? 0 points 0 points  What time? 0 points 0 points  Count back from 20 0 points 0 points  Months in reverse 0 points 0 points  Repeat phrase 0 points 2 points  Total Score 0 points 2 points    Immunizations Immunization History  Administered Date(s) Administered   Influenza,inj,Quad PF,6+ Mos 08/27/2018   Influenza,inj,quad, With Preservative 10/18/2021   Pneumococcal-Unspecified 10/24/2019   Zoster Recombinat (Shingrix) 08/27/2018, 02/10/2019    TDAP status: Due, Education has been provided regarding the importance of this vaccine. Advised may receive this vaccine at local pharmacy or Health Dept.  Aware to provide a copy of the vaccination record if obtained from local pharmacy or Health Dept. Verbalized acceptance and understanding.  Flu Vaccine status: Due, Education has been provided regarding the importance of this vaccine. Advised may receive this vaccine at local pharmacy or Health Dept. Aware to provide a copy of the vaccination record if obtained from local pharmacy or Health Dept. Verbalized acceptance and understanding.  Pneumococcal vaccine status: Due, Education has been provided regarding the importance of this vaccine. Advised may receive this vaccine at local pharmacy or Health Dept. Aware to provide a copy of the vaccination record if obtained from local pharmacy or Health Dept. Verbalized acceptance and understanding.  Covid-19 vaccine status: Declined, Education has been provided regarding the importance of  this vaccine but patient still declined. Advised may receive this vaccine at local pharmacy or Health Dept.or vaccine clinic. Aware to provide a copy of the vaccination record if obtained from local pharmacy or Health Dept. Verbalized acceptance and understanding.  Qualifies for Shingles Vaccine? Yes   Zostavax completed Yes   Shingrix Completed?: Yes  Screening Tests Health Maintenance  Topic Date Due   COVID-19 Vaccine (1) Never done   DTaP/Tdap/Td (1 - Tdap) Never done   COLONOSCOPY (Pts 45-61yrs Insurance coverage will need to be confirmed)  Never done   Pneumonia Vaccine 29+ Years old (1 - PCV) 12/30/2019   DEXA SCAN  Never done   INFLUENZA VACCINE  07/09/2022   MAMMOGRAM  03/10/2023   Medicare Annual Wellness (AWV)  12/07/2023   Hepatitis C Screening  Completed   Zoster Vaccines- Shingrix  Completed   HPV VACCINES  Aged Out    Health Maintenance  Health Maintenance Due  Topic Date Due   COVID-19 Vaccine (1) Never done   DTaP/Tdap/Td (1 - Tdap) Never done   COLONOSCOPY (Pts 45-68yrs Insurance coverage will need to be confirmed)  Never done    Pneumonia Vaccine 50+ Years old (1 - PCV) 12/30/2019   DEXA SCAN  Never done   INFLUENZA VACCINE  07/09/2022    Colorectal cancer screening: Type of screening: Colonoscopy. Completed 10. Repeat every 10 years  Mammogram: 4/22 recheck in 2 yrs. 4/24  Dexa Scan: nee to talk to pcp/and schedule   Lung Cancer Screening: (Low Dose CT Chest recommended if Age 48-80 years, 30 pack-year currently smoking OR have quit w/in 15years.) does not qualify.   Lung Cancer Screening Referral: n/a  Additional Screening:  Hepatitis C Screening: does not qualify; Completed n/a  Vision Screening: Recommended annual ophthalmology exams for early detection of glaucoma and other disorders of the eye. Is the patient up to date with their annual eye exam?  Yes  Who is the provider or what is the name of the office in which the patient attends annual eye exams? [facility] If pt is not established with a provider, would they like to be referred to a provider to establish care? No .   Dental Screening: Recommended annual dental exams for proper oral hygiene  Community Resource Referral / Chronic Care Management: CRR required this visit?  No   CCM required this visit?  No      Plan:     I have personally reviewed and noted the following in the patient's chart:   Medical and social history Use of alcohol, tobacco or illicit drugs  Current medications and supplements including opioid prescriptions. Patient is currently taking opioid prescriptions. Information provided to patient regarding non-opioid alternatives. Patient advised to discuss non-opioid treatment plan with their provider. Functional ability and status Nutritional status Physical activity Advanced directives List of other physicians Hospitalizations, surgeries, and ER visits in previous 12 months Vitals Screenings to include cognitive, depression, and falls Referrals and appointments  In addition, I have reviewed and  discussed with patient certain preventive protocols, quality metrics, and best practice recommendations. A written personalized care plan for preventive services as well as general preventive health recommendations were provided to patient.     Arta Silence, CMA   12/06/2022   Nurse Notes: n/a

## 2022-12-14 ENCOUNTER — Other Ambulatory Visit: Payer: Self-pay | Admitting: Family Medicine

## 2022-12-17 ENCOUNTER — Encounter: Payer: TRICARE For Life (TFL) | Admitting: Family Medicine

## 2022-12-18 ENCOUNTER — Other Ambulatory Visit: Payer: Self-pay | Admitting: Family Medicine

## 2022-12-18 NOTE — Telephone Encounter (Signed)
90 day courtesy refill Requested Prescriptions  Pending Prescriptions Disp Refills   valACYclovir (VALTREX) 500 MG tablet [Pharmacy Med Name: VALACYCLOVIR HCL TABS 500MG ] 90 tablet 0    Sig: TAKE 1 TABLET DAILY     Antimicrobials:  Antiviral Agents - Anti-Herpetic Passed - 12/18/2022  2:32 AM      Passed - Valid encounter within last 12 months    Recent Outpatient Visits           10 months ago Pain of right lower extremity   [place] Pickard, Cammie Mcgee, MD   1 year ago [place] Dennard Schaumann, Cammie Mcgee, MD   1 year ago Vestibular migraine   [place] Dennard Schaumann Cammie Mcgee, MD   2 years ago Atrophic vaginitis   [place], [place] Nunnery, MD   2 years ago [place] Pickard, Cammie Mcgee, MD       Future Appointments             In 1 month Pickard, Cammie Mcgee, MD [place]

## 2022-12-19 ENCOUNTER — Ambulatory Visit: Payer: Medicare Other | Admitting: Psychiatry

## 2022-12-20 ENCOUNTER — Other Ambulatory Visit: Payer: Self-pay | Admitting: Family Medicine

## 2022-12-20 ENCOUNTER — Encounter: Payer: Self-pay | Admitting: Family Medicine

## 2022-12-20 DIAGNOSIS — R42 Dizziness and giddiness: Secondary | ICD-10-CM

## 2022-12-20 MED ORDER — MECLIZINE HCL 25 MG PO TABS: ORAL_TABLET | ORAL | 3 refills | Status: DC

## 2022-12-22 ENCOUNTER — Encounter: Payer: Self-pay | Admitting: Family Medicine

## 2022-12-23 ENCOUNTER — Other Ambulatory Visit: Payer: Self-pay | Admitting: Family Medicine

## 2022-12-23 ENCOUNTER — Telehealth: Payer: Self-pay | Admitting: Family Medicine

## 2022-12-23 ENCOUNTER — Other Ambulatory Visit: Payer: Self-pay

## 2022-12-23 DIAGNOSIS — N898 Other specified noninflammatory disorders of vagina: Secondary | ICD-10-CM

## 2022-12-23 MED ORDER — FLUCONAZOLE 150 MG PO TABS: ORAL_TABLET | ORAL | 1 refills | Status: DC

## 2022-12-23 NOTE — Telephone Encounter (Signed)
Patient called to report an outbreak of intertrigo; stated she's not in the position to come to the office for a visit because her mother is dying (predicted she has 24 hours to live).  Requesting refill of fluconazole (DIFLUCAN) 150 MG tablet   Requesting for script to be sent to [facility] on [address] in [place], phone number [phone].  Please advise patient at [phone] when script called in so she can send someone to pick it up.

## 2022-12-26 DIAGNOSIS — F3132 Bipolar disorder, current episode depressed, moderate: Secondary | ICD-10-CM | POA: Diagnosis not present

## 2022-12-26 DIAGNOSIS — F319 Bipolar disorder, unspecified: Secondary | ICD-10-CM | POA: Diagnosis not present

## 2022-12-26 DIAGNOSIS — F411 Generalized anxiety disorder: Secondary | ICD-10-CM | POA: Diagnosis not present

## 2023-01-17 ENCOUNTER — Encounter: Payer: TRICARE For Life (TFL) | Admitting: Family Medicine

## 2023-01-21 DIAGNOSIS — F411 Generalized anxiety disorder: Secondary | ICD-10-CM | POA: Diagnosis not present

## 2023-01-21 DIAGNOSIS — F319 Bipolar disorder, unspecified: Secondary | ICD-10-CM | POA: Diagnosis not present

## 2023-01-21 DIAGNOSIS — F3132 Bipolar disorder, current episode depressed, moderate: Secondary | ICD-10-CM | POA: Diagnosis not present

## 2023-01-23 ENCOUNTER — Ambulatory Visit: Payer: Medicare Other | Admitting: Psychiatry

## 2023-01-27 ENCOUNTER — Ambulatory Visit (INDEPENDENT_AMBULATORY_CARE_PROVIDER_SITE_OTHER): Payer: Medicare Other | Admitting: Psychiatry

## 2023-01-27 DIAGNOSIS — G43019 Migraine without aura, intractable, without status migrainosus: Secondary | ICD-10-CM

## 2023-01-27 MED ORDER — ONABOTULINUMTOXINA 200 UNITS IJ SOLR
155.0000 [IU] | Freq: Once | INTRAMUSCULAR | Status: AC
  Administered 2023-01-27: 155 [IU] via INTRAMUSCULAR

## 2023-01-27 NOTE — Progress Notes (Signed)
[facility]  BOTULINUM TOXIN INJECTION PROCEDURE NOTE  Patient: Peggy Fitzgerald MRN: [id number]  Indication: Chronic migraines   History: 67 year old female with a history of bipolar disorder who follows in clinic for migraines. [name] unsure if Botox has been helping with her headaches. Continues to take Maxalt and diclofenac for rescue which do help take the edge off.  Last injection: 09/26/22 (third injection today)  Technique: Informed consent was obtained and signed. 200 units onabotulinumtoxinA (Lot# [id number]; Expiration: 05/2025) were reconstituted using normal saline, to a concentration of 5 units per 0.32m. 155 units were injected using sterile technique across 31 sites as follows: corrugator 10, procerus 5 units, frontalis 20 units, temporalis 40 units, occipitalis 30 units, cervical paraspinal 20 units, trapezius 30 units. 45 units were wasted. Patient tolerated procedure without complication.  Prior Therapies                                  Preventive: Amitriptyline 150 mg QHS Lamictal 100/50 Metoprolol 100 mg daily Topamax - lack of efficacy Ajovy 225 mg monthly Botox   Rescue: Fioricet Zofran robaxin Meclizine Imitrex Maxalt 10 mg PRN Migranal Ubrelvy 100 mg PRN  Plan: -Rescue: Continue Maxalt 10 mg PRN. Continue diclofenac 50-100 PRN, can take with Maxalt if needed -Prevention: Continue Botox for now. If no improvement with this session would consider [facility] for migraine prevention  JGenia Harold2/19/24 JGenia Harold

## 2023-01-27 NOTE — Progress Notes (Signed)
Botox- 200 units x 1 vial Lot: [id number] Expiration: 05/2025 NDC: [id number]  Bacteriostatic 0.9% Sodium Chloride- 49m total Lot: [id number]: 11/25 NDC: [id number] Dx: [id number]

## 2023-02-03 ENCOUNTER — Encounter: Payer: Medicare Other | Admitting: Family Medicine

## 2023-02-24 ENCOUNTER — Ambulatory Visit
Admission: RE | Admit: 2023-02-24 | Discharge: 2023-02-24 | Disposition: A | Discharge status: Home / Self Care | Payer: Medicare Other | Source: Ambulatory Visit | Attending: Nurse Practitioner | Admitting: Nurse Practitioner

## 2023-02-24 VITALS — BP 146/87 | HR 66 | Temp 98.0°F | Resp 18

## 2023-02-24 DIAGNOSIS — J0140 Acute pansinusitis, unspecified: Secondary | ICD-10-CM

## 2023-02-24 DIAGNOSIS — N898 Other specified noninflammatory disorders of vagina: Secondary | ICD-10-CM

## 2023-02-24 MED ORDER — FLUCONAZOLE 150 MG PO TABS: ORAL_TABLET | ORAL | 0 refills | Status: DC

## 2023-02-24 MED ORDER — AMOXICILLIN-POT CLAVULANATE 875-125 MG PO TABS: 1.0000 | ORAL_TABLET | Freq: Two times a day (BID) | ORAL | 0 refills | Status: AC

## 2023-02-24 NOTE — Discharge Instructions (Signed)
You have a sinus infection.  Take the Augmentin to treat it.   Some things that can make you feel better are: - Increased rest - Increasing fluid with water/sugar free electrolytes - Acetaminophen and ibuprofen as needed for fever/pain - Salt water gargling, chloraseptic spray and throat lozenges for sore throat - OTC guaifenesin (Mucinex) 600 mg twice daily for congestion - Saline sinus flushes or a neti pot - Humidifying the air - Coricidin products  Seek care if your symptoms do not improve with this treatment.

## 2023-02-24 NOTE — ED Provider Notes (Signed)
[facility]    CSN: [id number] Arrival date & time: 02/24/23  1357      History   Chief Complaint Chief Complaint  Patient presents with   Facial Pain    Possible sinus infection - Entered by patient    HPI Peggy Fitzgerald is a 67 y.o. female.   Patient presents today for 10-day history of nasal congestion, sinus pressure in her cheeks, headache that is typical for her migraine.  She denies fever, bodies, chills, cough, shortness of breath or chest pain, sore throat, ear pain, abdominal pain, nausea/vomiting, diarrhea, decreased appetite, and fatigue.  Has been taking over-the-counter sinus medication and DayQuil without relief of symptoms.  Patient reports she does not typically check her blood pressure at home.  She has missed a few doses of metoprolol.  No chest pain, shortness of breath, double vision, blurred vision, or or lower extremity swelling.  Reports she has felt little bit of dizziness/lightheadedness that started today.  Reports has an upcoming appointment with her primary care provider in about 3 weeks.    Past Medical History:  Diagnosis Date   AC (acromioclavicular) joint bone spurs, right    Allergy    seasonal   Anxiety    Asthma    asthmatic bronchitis   Bipolar 1 disorder, depressed ([place])    Candida infection    Cataract 2007   Depression    Genital herpes    GERD (gastroesophageal reflux disease)    Headache    Herpes simplex type 2 infection    High cholesterol    Hyperlipidemia    Hypertension    Migraine    Plantar fasciitis of right foot    Prediabetes    Rotator cuff tear    right shoulder    Patient Active Problem List   Diagnosis Date Noted   Right knee pain 10/18/2021   Obesity (BMI 30-39.9) 08/23/2021   Migraine 05/21/2018   Low back pain without sciatica 07/01/2017   Urinary tract infection without hematuria 07/01/2017   Vaginal dryness 07/01/2017   Urinary frequency 07/01/2017   Bipolar 1 disorder, mixed,  moderate ([place]) 08/28/2016   Insomnia 12/18/2015   Headache 12/18/2015   Vertigo 12/18/2015   Allergy    Anxiety    Asthma    Depression    Prediabetes    GERD (gastroesophageal reflux disease)    Hyperlipidemia    Hypertension    Herpes simplex type 2 infection    SHOULDER PAIN 04/24/2009   IMPINGEMENT SYNDROME 04/24/2009    Past Surgical History:  Procedure Laterality Date   CATARACT EXTRACTION, BILATERAL     CHOLECYSTECTOMY  1987   KNEE SURGERY Right     OB History     Gravida  3   Para  3   Term  3   Preterm      AB      Living  3      SAB      IAB      Ectopic      Multiple      Live Births  3            Home Medications    Prior to Admission medications   Medication Sig Start Date End Date Taking? Authorizing Provider  amoxicillin-clavulanate (AUGMENTIN) 875-125 MG tablet Take 1 tablet by mouth 2 (two) times daily for 7 days. 02/24/23 03/03/23 Yes Eulogio Bear, NP  atorvastatin (LIPITOR) 40 MG tablet TAKE 1 TABLET DAILY 01/04/21  Yes Susy Frizzle, MD  estradiol (ESTRACE) 0.1 MG/GM vaginal cream PLACE 1 GRAM VAGINALLY AT BEDTIME 08/29/22  Yes Eure, Mertie Clause, MD  LATUDA 80 MG TABS tablet Take 80 mg by mouth daily.  02/11/17  Yes [provider]  lurasidone (LATUDA) 20 MG TABS tablet Take 20 mg by mouth every morning. 04/03/22  Yes [provider]  methocarbamol (ROBAXIN) 500 MG tablet TAKE 1 TABLET FOUR TIMES A DAY 05/03/22  Yes Susy Frizzle, MD  metoprolol succinate (TOPROL-XL) 100 MG 24 hr tablet TAKE 1 TABLET AT BEDTIME WITH OR IMMEDIATELY FOLLOWING A MEAL  (CALL CLINIC TO SCHEDULE APPOINTMENT FOR FUTURE REFILLS. [phone]) 08/06/21  Yes Susy Frizzle, MD  promethazine (PHENERGAN) 25 MG tablet TAKE 1 TABLET EVERY 6 HOURS AS NEEDED FOR NAUSEA OR VOMITING 04/08/22  Yes Susy Frizzle, MD  rizatriptan (MAXALT) 10 MG tablet Take 1 tablet (10 mg total) by mouth as needed for migraine. May repeat in 2 hours if  needed 09/26/22  Yes Chima, Anderson Malta, MD  triamcinolone ointment (KENALOG) 0.5 % Apply 1 application. topically 2 (two) times daily. 04/11/22  Yes Leath-Warren, Alda Lea, NP  zolpidem (AMBIEN) 5 MG tablet Take 6.5 mg by mouth at bedtime as needed. 6.5 11/10/21  Yes [provider]  acyclovir cream (ZOVIRAX) 5 % APPLY 1 APPLICATION TOPICALLY TWICE A DAY AS NEEDED FOR OUTBREAKS 10/23/21   Susy Frizzle, MD  albuterol (VENTOLIN HFA) 108 (90 Base) MCG/ACT inhaler Inhale 2 puffs into the lungs every 4 (four) hours as needed for wheezing or shortness of breath. 12/15/20   Opdyke, Modena Nunnery, MD  clobetasol (TEMOVATE) 0.05 % external solution APPLY 1 APPLICATION TOPICALLY TWICE A DAY AS NEEDED, APPLY TO SCALP AS NEEDED FOR IRRITATION 02/14/22   Susy Frizzle, MD  clotrimazole-betamethasone (LOTRISONE) cream Apply 1 application. topically 2 (two) times daily. 04/25/22   Susy Frizzle, MD  diclofenac (CATAFLAM) 50 MG tablet Take 50-100 mg at onset of migraine. Max dose 2 pills in 24 hours 09/26/22   Genia Harold, MD  fluconazole (DIFLUCAN) 150 MG tablet Take 1 tablet at completion of antibiotics and 3 days later if still symptomatic 02/24/23   Eulogio Bear, NP  fluticasone (FLONASE) 50 MCG/ACT nasal spray Place 2 sprays into both nostrils daily. Patient taking differently: Place 2 sprays into both nostrils as needed. 12/22/20   Alycia Rossetti, MD  hydrOXYzine (ATARAX/VISTARIL) 25 MG tablet Take 1 tablet (25 mg total) by mouth 3 (three) times daily as needed. 09/06/20   [place], Modena Nunnery, MD  ketoconazole (NIZORAL) 2 % cream Apply 1 Application topically 2 (two) times daily as needed for irritation. 07/07/22   Jaynee Eagles, PA-C  lamoTRIgine (LAMICTAL) 25 MG tablet Take 1 tablet (25 mg total) by mouth daily. 08/30/16   [place], Elyse Jarvis, FNP  LORazepam (ATIVAN) 0.5 MG tablet Take 1 tablet (0.5 mg total) by mouth every 8 (eight) hours as needed for anxiety (itching). 09/21/20   Susy Frizzle, MD  meclizine (ANTIVERT) 25 MG tablet TAKE 1 TABLET AS NEEDED FOR DIZZINESS 12/20/22   Susy Frizzle, MD  montelukast (SINGULAIR) 10 MG tablet TAKE 1 TABLET(10 MG) BY MOUTH AT BEDTIME 12/16/22   Susy Frizzle, MD  mupirocin ointment (BACTROBAN) 2 % Apply 1 application. topically 2 (two) times daily. 04/11/22   Leath-Warren, Alda Lea, NP  ondansetron (ZOFRAN-ODT) 4 MG disintegrating tablet Take 1 tablet (4 mg total) by mouth every 8 (eight) hours as needed  for nausea or vomiting. 11/13/22   Volney American, PA-C  PREVIDENT 5000 BOOSTER PLUS 1.1 % PSTE SMARTSIG:To Teeth 3 Times Daily 10/07/22   [provider]  valACYclovir (VALTREX) 500 MG tablet TAKE 1 TABLET DAILY 12/18/22   Susy Frizzle, MD    Family History Family History  Problem Relation Age of Onset   Arthritis Mother    Asthma Mother    Hearing loss Mother    Hyperlipidemia Mother    Hypertension Mother    Miscarriages / [place] Mother    Stroke Mother    Hearing loss Father    Hypertension Father    Stroke Father    Diabetes Father    Bell's palsy Father    Cancer Brother        leukemia   Early death Brother    Arthritis Maternal Grandmother    Depression Maternal Grandmother    Diabetes Maternal Grandmother    Heart disease Maternal Grandmother        CHF   Miscarriages / Stillbirths Maternal Grandmother    Vision loss Maternal Grandmother    Alcohol abuse Maternal Grandfather    Heart attack Maternal Grandfather    Arthritis Paternal Grandmother    Heart disease Paternal Grandmother        massive heart attack   Miscarriages / Stillbirths Paternal Grandmother    Alcohol abuse Paternal Grandfather    Stroke Paternal Grandfather    Heart disease Paternal Grandfather        arteriosclerosis    Social History Social History   Tobacco Use   Smoking status: Former    Years: .5    Types: Cigarettes   Smokeless tobacco: Never   Tobacco comments:    Quit many years ago  Vaping  Use   Vaping Use: Never used  Substance Use Topics   Alcohol use: No   Drug use: No     Allergies   Hydrocodone-acetaminophen, Milk-related compounds, and Oxycodone-acetaminophen   Review of Systems Review of Systems Per HPI  Physical Exam Triage Vital Signs ED Triage Vitals  Enc Vitals Group     BP 02/24/23 1425 (!) 174/113     Pulse Rate 02/24/23 1424 66     Resp 02/24/23 1424 18     Temp 02/24/23 1424 98 F (36.7 C)     Temp Source 02/24/23 1424 Oral     SpO2 02/24/23 1424 98 %     Weight --      Height --      Head Circumference --      Peak Flow --      Pain Score --      Pain Loc --      Pain Edu? --      Excl. in [place]? --    No data found.  Updated Vital Signs BP (!) 146/87   Pulse 66   Temp 98 F (36.7 C) (Oral)   Resp 18   SpO2 98%   Visual Acuity Right Eye Distance:   Left Eye Distance:   Bilateral Distance:    Right Eye Near:   Left Eye Near:    Bilateral Near:     Physical Exam Vitals and nursing note reviewed.  Constitutional:      General: [name] not in acute distress.    Appearance: Normal appearance. [name] not ill-appearing or toxic-appearing.  HENT:     Head: Normocephalic and atraumatic.     Right Ear: Tympanic membrane, ear  canal and external ear normal.     Left Ear: Tympanic membrane, ear canal and external ear normal.     Nose: No congestion or rhinorrhea.     Right Sinus: Maxillary sinus tenderness and frontal sinus tenderness present.     Left Sinus: Maxillary sinus tenderness and frontal sinus tenderness present.     Mouth/Throat:     Mouth: Mucous membranes are moist.     Pharynx: Oropharynx is clear. No oropharyngeal exudate or posterior oropharyngeal erythema.  Eyes:     General: No scleral icterus.    Extraocular Movements: Extraocular movements intact.  Cardiovascular:     Rate and Rhythm: Normal rate and regular rhythm.  Pulmonary:     Effort: Pulmonary effort is normal. No respiratory distress.     Breath  sounds: Normal breath sounds. No wheezing, rhonchi or rales.  Abdominal:     General: Abdomen is flat. Bowel sounds are normal. There is no distension.     Palpations: Abdomen is soft.  Musculoskeletal:     Cervical back: Normal range of motion and neck supple.  Skin:    General: Skin is warm and dry.     Capillary Refill: Capillary refill takes less than 2 seconds.     Coloration: Skin is not jaundiced or pale.     Findings: No erythema or rash.  Neurological:     Mental Status: [name] alert and oriented to person, place, and time.  Psychiatric:        Behavior: Behavior is cooperative.      UC Treatments / Results  Labs (all labs ordered are listed, but only abnormal results are displayed) Labs Reviewed - No data to display  EKG   Radiology No results found.  Procedures Procedures (including critical care time)  Medications Ordered in UC Medications - No data to display  Initial Impression / Assessment and Plan / UC Course  I have reviewed the triage vital signs and the nursing notes.  Pertinent labs & imaging results that were available during my care of the patient were reviewed by me and considered in my medical decision making (see chart for details).   Patient is well-appearing, afebrile, not tachycardic, not tachypneic, oxygenating well on room air.  Initially in triage, patient is hypertensive up to 170/100.  On recheck, BP is 146/87.  1. Acute non-recurrent pansinusitis Treat with Augmentin twice daily for 7 days Supportive care discussed Recommended Coricidin products moving forward 2 doses of Diflucan given if she develops yeast infection after antibiotic therapy to take at completion and 3 days later if still symptomatic  The patient was given the opportunity to ask questions.  All questions answered to their satisfaction.  The patient is in agreement to this plan.    Final Clinical Impressions(s) / UC Diagnoses   Final diagnoses:  Acute non-recurrent  pansinusitis     Discharge Instructions      You have a sinus infection.  Take the Augmentin to treat it.   Some things that can make you feel better are: - Increased rest - Increasing fluid with water/sugar free electrolytes - Acetaminophen and ibuprofen as needed for fever/pain - Salt water gargling, chloraseptic spray and throat lozenges for sore throat - OTC guaifenesin (Mucinex) 600 mg twice daily for congestion - Saline sinus flushes or a neti pot - Humidifying the air - Coricidin products  Seek care if your symptoms do not improve with this treatment.     ED Prescriptions  Medication Sig Dispense Auth. Provider   amoxicillin-clavulanate (AUGMENTIN) 875-125 MG tablet Take 1 tablet by mouth 2 (two) times daily for 7 days. 14 tablet Noemi Chapel A, NP   fluconazole (DIFLUCAN) 150 MG tablet Take 1 tablet at completion of antibiotics and 3 days later if still symptomatic 2 tablet Eulogio Bear, NP      PDMP not reviewed this encounter.   Eulogio Bear, NP 02/24/23 1455

## 2023-02-24 NOTE — ED Triage Notes (Signed)
Pt states she is having pain and pressure in face/ sinus pain. Pt states she had a tooth pulled 02/12/23 and they said her roots were into her sinuses and since then she has been having problems with pain around nose, under eyes and drainage down throat. Taking OTC sinus medication, dyquil but no relief of symptoms. Pt also states she started having dizziness today that gets worse when standing. Pt BP is elevated 170/102 but states she did not take her BP medication today.

## 2023-02-25 ENCOUNTER — Other Ambulatory Visit: Payer: Self-pay | Admitting: Family Medicine

## 2023-02-25 ENCOUNTER — Encounter: Payer: Self-pay | Admitting: Obstetrics & Gynecology

## 2023-02-25 ENCOUNTER — Ambulatory Visit (INDEPENDENT_AMBULATORY_CARE_PROVIDER_SITE_OTHER): Payer: Medicare Other | Admitting: Obstetrics & Gynecology

## 2023-02-25 VITALS — BP 135/78 | HR 71

## 2023-02-25 DIAGNOSIS — N941 Unspecified dyspareunia: Secondary | ICD-10-CM | POA: Diagnosis not present

## 2023-02-25 DIAGNOSIS — N952 Postmenopausal atrophic vaginitis: Secondary | ICD-10-CM | POA: Diagnosis not present

## 2023-02-26 DIAGNOSIS — M25511 Pain in right shoulder: Secondary | ICD-10-CM | POA: Diagnosis not present

## 2023-02-26 DIAGNOSIS — M25561 Pain in right knee: Secondary | ICD-10-CM | POA: Diagnosis not present

## 2023-02-26 DIAGNOSIS — M25551 Pain in right hip: Secondary | ICD-10-CM | POA: Diagnosis not present

## 2023-03-20 ENCOUNTER — Ambulatory Visit (INDEPENDENT_AMBULATORY_CARE_PROVIDER_SITE_OTHER): Payer: Medicare Other | Admitting: Family Medicine

## 2023-03-20 ENCOUNTER — Encounter: Payer: Self-pay | Admitting: Family Medicine

## 2023-03-20 VITALS — BP 122/82 | HR 69 | Temp 98.1°F | Ht 66.0 in | Wt 187.0 lb

## 2023-03-20 DIAGNOSIS — Z1211 Encounter for screening for malignant neoplasm of colon: Secondary | ICD-10-CM | POA: Diagnosis not present

## 2023-03-20 DIAGNOSIS — Z1231 Encounter for screening mammogram for malignant neoplasm of breast: Secondary | ICD-10-CM

## 2023-03-20 DIAGNOSIS — I10 Essential (primary) hypertension: Secondary | ICD-10-CM

## 2023-03-20 DIAGNOSIS — Z780 Asymptomatic menopausal state: Secondary | ICD-10-CM | POA: Diagnosis not present

## 2023-03-20 DIAGNOSIS — Z23 Encounter for immunization: Secondary | ICD-10-CM

## 2023-03-20 MED ORDER — FLUTICASONE PROPIONATE 50 MCG/ACT NA SUSP: 2.0000 | Freq: Every day | NASAL | 6 refills | Status: AC

## 2023-03-20 NOTE — Progress Notes (Signed)
Subjective:    Patient ID: Peggy Fitzgerald, female    DOB: 16-Nov-1956, 67 y.o.   MRN: [id number]  HPI  Patient is a 67 year old Caucasian female who is here today for a physical exam.  [name] due for mammogram.  Her last Cologuard test was in 2021.  Therefore she is due to repeat a Cologuard.  She declines colonoscopy.  Due to age she does not require Pap smear. Immunization History  Administered Date(s) Administered   Influenza,inj,Quad PF,6+ Mos 08/27/2018   Influenza,inj,quad, With Preservative 10/18/2021   Pneumococcal Polysaccharide-23 03/20/2023   Pneumococcal-Unspecified 10/24/2019   Zoster Recombinat (Shingrix) 08/27/2018, 02/10/2019   Past Medical History:  Diagnosis Date   AC (acromioclavicular) joint bone spurs, right    Allergy    seasonal   Anxiety    Asthma    asthmatic bronchitis   Bipolar 1 disorder, depressed    Candida infection    Cataract 2007   Depression    Genital herpes    GERD (gastroesophageal reflux disease)    Headache    Herpes simplex type 2 infection    High cholesterol    Hyperlipidemia    Hypertension    Migraine    Plantar fasciitis of right foot    Prediabetes    Rotator cuff tear    right shoulder   Past Surgical History:  Procedure Laterality Date   CATARACT EXTRACTION, BILATERAL     CHOLECYSTECTOMY  1987   KNEE SURGERY Right    Current Outpatient Medications on File Prior to Visit  Medication Sig Dispense Refill   acyclovir cream (ZOVIRAX) 5 % APPLY 1 APPLICATION TOPICALLY TWICE A DAY AS NEEDED FOR OUTBREAKS 15 g 3   atorvastatin (LIPITOR) 40 MG tablet TAKE 1 TABLET DAILY 90 tablet 3   clobetasol (TEMOVATE) 0.05 % external solution APPLY 1 APPLICATION TOPICALLY TWICE A DAY AS NEEDED, APPLY TO SCALP AS NEEDED FOR IRRITATION 150 mL 3   clotrimazole-betamethasone (LOTRISONE) cream Apply 1 application. topically 2 (two) times daily. 45 g 0   diclofenac (CATAFLAM) 50 MG tablet Take 50-100 mg at onset of migraine. Max dose 2  pills in 24 hours 45 tablet 4   estradiol (ESTRACE) 0.1 MG/GM vaginal cream PLACE 1 GRAM VAGINALLY AT BEDTIME 42.5 g 7   hydrOXYzine (ATARAX/VISTARIL) 25 MG tablet Take 1 tablet (25 mg total) by mouth 3 (three) times daily as needed.     ketoconazole (NIZORAL) 2 % cream Apply 1 Application topically 2 (two) times daily as needed for irritation. 60 g 1   lamoTRIgine (LAMICTAL) 25 MG tablet Take 1 tablet (25 mg total) by mouth daily. 14 tablet 0   LATUDA 80 MG TABS tablet Take 80 mg by mouth daily.      LORazepam (ATIVAN) 0.5 MG tablet Take 1 tablet (0.5 mg total) by mouth every 8 (eight) hours as needed for anxiety (itching). 30 tablet 0   lurasidone (LATUDA) 20 MG TABS tablet Take 20 mg by mouth every morning.     meclizine (ANTIVERT) 25 MG tablet TAKE 1 TABLET AS NEEDED FOR DIZZINESS 90 tablet 3   methocarbamol (ROBAXIN) 500 MG tablet TAKE 1 TABLET FOUR TIMES A DAY 90 tablet 14   metoprolol succinate (TOPROL-XL) 100 MG 24 hr tablet TAKE 1 TABLET AT BEDTIME WITH OR IMMEDIATELY FOLLOWING A MEAL  (CALL CLINIC TO SCHEDULE APPOINTMENT FOR FUTURE REFILLS. [phone]) 90 tablet 3   montelukast (SINGULAIR) 10 MG tablet TAKE 1 TABLET(10 MG) BY MOUTH AT BEDTIME 90 tablet  0   mupirocin ointment (BACTROBAN) 2 % Apply 1 application. topically 2 (two) times daily. 30 g 0   ondansetron (ZOFRAN-ODT) 4 MG disintegrating tablet Take 1 tablet (4 mg total) by mouth every 8 (eight) hours as needed for nausea or vomiting. 20 tablet 0   PREVIDENT 5000 BOOSTER PLUS 1.1 % PSTE SMARTSIG:To Teeth 3 Times Daily     promethazine (PHENERGAN) 25 MG tablet TAKE 1 TABLET EVERY 6 HOURS AS NEEDED FOR NAUSEA OR VOMITING 90 tablet 14   rizatriptan (MAXALT) 10 MG tablet Take 1 tablet (10 mg total) by mouth as needed for migraine. May repeat in 2 hours if needed 30 tablet 4   triamcinolone ointment (KENALOG) 0.5 % Apply 1 application. topically 2 (two) times daily. 45 g 0   valACYclovir (VALTREX) 500 MG tablet TAKE 1 TABLET DAILY 90  tablet 0   zolpidem (AMBIEN) 5 MG tablet Take 6.5 mg by mouth at bedtime as needed. 6.5     No current facility-administered medications on file prior to visit.   Allergies  Allergen Reactions   Hydrocodone-Acetaminophen Other (See Comments)    Headaches on long term use   Milk-Related Compounds Diarrhea    Cannot drink plain milk, but can eat ice cream, and milk in cereal   Oxycodone-Acetaminophen Itching    Nightmares   Social History   Socioeconomic History   Marital status: Married    Spouse name: Homero Fellers   Number of children: 3   Years of education: Not on file   Highest education level: Not on file  Occupational History   Not on file  Tobacco Use   Smoking status: Former    Years: .5    Types: Cigarettes   Smokeless tobacco: Never   Tobacco comments:    Quit many years ago  Vaping Use   Vaping Use: Never used  Substance and Sexual Activity   Alcohol use: No   Drug use: No   Sexual activity: Yes    Birth control/protection: None    Comment: spouse vasectomy  Other Topics Concern   Not on file  Social History Narrative   Lives with husband.    14 grandchildren and 1 expected in 2023.   Caffeine- tea 1 glass   Social Determinants of Health   Financial Resource Strain: High Risk (12/06/2022)   Overall Financial Resource Strain (CARDIA)    Difficulty of Paying Living Expenses: Hard  Food Insecurity: No Food Insecurity (12/06/2022)   Hunger Vital Sign    Worried About Running Out of Food in the Last Year: Never true    Ran Out of Food in the Last Year: Never true  Transportation Needs: No Transportation Needs (12/06/2022)   PRAPARE - Administrator, Civil Service (Medical): No    Lack of Transportation (Non-Medical): No  Physical Activity: Inactive (12/06/2022)   Exercise Vital Sign    Days of Exercise per Week: 0 days    Minutes of Exercise per Session: 0 min  Stress: Stress Concern Present (12/06/2022)   [employer] of Occupational  Health - Occupational Stress Questionnaire    Feeling of Stress : Rather much  Social Connections: Moderately Integrated (12/06/2022)   Social Connection and Isolation Panel [NHANES]    Frequency of Communication with Friends and Family: Three times a week    Frequency of Social Gatherings with Friends and Family: Three times a week    Attends Religious Services: 1 to 4 times per year    Active Member  of Clubs or Organizations: No    Attends Banker Meetings: Never    Marital Status: Married  Catering manager Violence: Not At Risk (12/06/2022)   Humiliation, Afraid, Rape, and Kick questionnaire    Fear of Current or Ex-Partner: No    Emotionally Abused: No    Physically Abused: No    Sexually Abused: No   Family History  Problem Relation Age of Onset   Arthritis Mother    Asthma Mother    Hearing loss Mother    Hyperlipidemia Mother    Hypertension Mother    Miscarriages / [place] Mother    Stroke Mother    Hearing loss Father    Hypertension Father    Stroke Father    Diabetes Father    Bell's palsy Father    Cancer Brother        leukemia   Early death Brother    Arthritis Maternal Grandmother    Depression Maternal Grandmother    Diabetes Maternal Grandmother    Heart disease Maternal Grandmother        CHF   Miscarriages / Stillbirths Maternal Grandmother    Vision loss Maternal Grandmother    Alcohol abuse Maternal Grandfather    Heart attack Maternal Grandfather    Arthritis Paternal Grandmother    Heart disease Paternal Grandmother        massive heart attack   Miscarriages / Stillbirths Paternal Grandmother    Alcohol abuse Paternal Grandfather    Stroke Paternal Grandfather    Heart disease Paternal Grandfather        arteriosclerosis       Review of Systems  All other systems reviewed and are negative.      Objective:   Physical Exam Vitals reviewed.  Constitutional:      General: [name] not in acute distress.    Appearance:  Normal appearance. [name] obese. [name] not ill-appearing, toxic-appearing or diaphoretic.  HENT:     Head: Normocephalic and atraumatic.     Right Ear: Tympanic membrane, ear canal and external ear normal. There is no impacted cerumen.     Left Ear: Tympanic membrane, ear canal and external ear normal. There is no impacted cerumen.     Nose: Nose normal. No congestion or rhinorrhea.     Mouth/Throat:     Mouth: Mucous membranes are moist.     Pharynx: Oropharynx is clear. No oropharyngeal exudate or posterior oropharyngeal erythema.  Eyes:     General: No scleral icterus.       Right eye: No discharge.        Left eye: No discharge.     Extraocular Movements: Extraocular movements intact.     Conjunctiva/sclera: Conjunctivae normal.     Pupils: Pupils are equal, round, and reactive to light.  Neck:     Vascular: No carotid bruit.  Cardiovascular:     Rate and Rhythm: Normal rate and regular rhythm.     Pulses: Normal pulses.     Heart sounds: Normal heart sounds. No murmur heard.    No friction rub. No gallop.  Pulmonary:     Effort: Pulmonary effort is normal. No respiratory distress.     Breath sounds: Normal breath sounds. No stridor. No wheezing, rhonchi or rales.  Chest:     Chest wall: No tenderness.  Abdominal:     General: Abdomen is flat. Bowel sounds are normal. There is no distension.     Palpations: Abdomen is soft. There  is no mass.     Tenderness: There is no abdominal tenderness. There is no right CVA tenderness, left CVA tenderness, guarding or rebound.     Hernia: No hernia is present.  Musculoskeletal:     Cervical back: Normal range of motion and neck supple. No rigidity or tenderness.     Right lower leg: No edema.     Left lower leg: No edema.  Lymphadenopathy:     Cervical: No cervical adenopathy.  Skin:    Findings: No rash.  Neurological:     General: No focal deficit present.     Mental Status: [name] alert and oriented to person, place, and time.  Mental status is at baseline.     Cranial Nerves: No cranial nerve deficit.     Sensory: No sensory deficit.     Motor: No weakness.     Coordination: Coordination normal.     Gait: Gait normal.     Deep Tendon Reflexes: Reflexes normal.  Psychiatric:        Mood and Affect: Mood normal.        Behavior: Behavior normal.        Thought Content: Thought content normal.        Judgment: Judgment normal.           Assessment & Plan:  Special screening for malignant neoplasms, colon - Plan: Cologuard, Cologuard  Visit for screening mammogram - Plan: MM 3D SCREENING MAMMOGRAM BILATERAL BREAST, MM Digital Screening  Benign essential HTN - Plan: CBC with Differential/Platelet, COMPLETE METABOLIC PANEL WITH GFR, Lipid panel  Need for 23-polyvalent pneumococcal polysaccharide vaccine - Plan: Pneumococcal polysaccharide vaccine 23-valent greater than or equal to 2yo subcutaneous/IM  Postmenopausal estrogen deficiency - Plan: DG Bone Density I will schedule the patient for Cologuard to screen for colon cancer.  I will schedule her for mammogram screening for breast cancer.  I will schedule her for a bone density test to screen for osteoporosis.  Patient received Pneumovax 23 today.  Her shingles vaccine is up-to-date.  She will be due for Prevnar 20 next year.  Her blood pressure today is excellent.  I will check a CBC and a CMP and a lipid panel.  Goal LDL cholesterol is less than 100.  Patient does want assistance with weight loss.  I explained to her that her insurance will likely not cover this.  However I did write her a prescription for Wegovy 0.5 mg subcu weekly.  Patient wants to investigate the cash price.  I do believe the medication will be safe for her to take but I believe that will be cost prohibitive.

## 2023-03-21 ENCOUNTER — Other Ambulatory Visit: Payer: TRICARE For Life (TFL)

## 2023-03-21 LAB — COMPLETE METABOLIC PANEL WITH GFR
AG Ratio: 1.6 (calc) (ref 1.0–2.5)
ALT: 15 U/L (ref 6–29)
AST: 18 U/L (ref 10–35)
Albumin: 4.1 g/dL (ref 3.6–5.1)
Alkaline phosphatase (APISO): 55 U/L (ref 37–153)
BUN: 15 mg/dL (ref 7–25)
CO2: 25 mmol/L (ref 20–32)
Calcium: 9.2 mg/dL (ref 8.6–10.4)
Chloride: 103 mmol/L (ref 98–110)
Creat: 1.04 mg/dL (ref 0.50–1.05)
Globulin: 2.5 g/dL (calc) (ref 1.9–3.7)
Glucose, Bld: 95 mg/dL (ref 65–99)
Potassium: 4.7 mmol/L (ref 3.5–5.3)
Sodium: 138 mmol/L (ref 135–146)
Total Bilirubin: 0.5 mg/dL (ref 0.2–1.2)
Total Protein: 6.6 g/dL (ref 6.1–8.1)
eGFR: 59 mL/min/{1.73_m2} — ABNORMAL LOW (ref >=60)

## 2023-03-21 LAB — CBC WITH DIFFERENTIAL/PLATELET
Absolute Monocytes: 573 cells/uL (ref 200–950)
Basophils Absolute: 17 cells/uL (ref 0–200)
Basophils Relative: 0.2 %
Eosinophils Absolute: 241 cells/uL (ref 15–500)
Eosinophils Relative: 2.9 %
HCT: 40.5 % (ref 35.0–45.0)
Hemoglobin: 13.6 g/dL (ref 11.7–15.5)
Lymphs Abs: 1793 cells/uL (ref 850–3900)
MCH: 32.9 pg (ref 27.0–33.0)
MCHC: 33.6 g/dL (ref 32.0–36.0)
MCV: 98.1 fL (ref 80.0–100.0)
MPV: 9.7 fL (ref 7.5–12.5)
Monocytes Relative: 6.9 %
Neutro Abs: 5677 cells/uL (ref 1500–7800)
Neutrophils Relative %: 68.4 %
Platelets: 244 10*3/uL (ref 140–400)
RBC: 4.13 10*6/uL (ref 3.80–5.10)
RDW: 12.9 % (ref 11.0–15.0)
Total Lymphocyte: 21.6 %
WBC: 8.3 10*3/uL (ref 3.8–10.8)

## 2023-03-21 LAB — LIPID PANEL
Cholesterol: 174 mg/dL (ref <200)
HDL: 61 mg/dL (ref >=50)
LDL Cholesterol (Calc): 85 mg/dL (calc)
Non-HDL Cholesterol (Calc): 113 mg/dL (calc) (ref <130)
Total CHOL/HDL Ratio: 2.9 (calc) (ref <5.0)
Triglycerides: 184 mg/dL — ABNORMAL HIGH (ref <150)

## 2023-03-25 DIAGNOSIS — F411 Generalized anxiety disorder: Secondary | ICD-10-CM | POA: Diagnosis not present

## 2023-03-25 DIAGNOSIS — F3132 Bipolar disorder, current episode depressed, moderate: Secondary | ICD-10-CM | POA: Diagnosis not present

## 2023-03-25 DIAGNOSIS — F319 Bipolar disorder, unspecified: Secondary | ICD-10-CM | POA: Diagnosis not present

## 2023-03-26 DIAGNOSIS — Z961 Presence of intraocular lens: Secondary | ICD-10-CM | POA: Diagnosis not present

## 2023-03-28 ENCOUNTER — Ambulatory Visit (HOSPITAL_COMMUNITY)
Admission: RE | Admit: 2023-03-28 | Discharge: 2023-03-28 | Disposition: A | Discharge status: Home / Self Care | Payer: Medicare Other | Source: Ambulatory Visit | Attending: Family Medicine | Admitting: Family Medicine

## 2023-03-28 DIAGNOSIS — Z780 Asymptomatic menopausal state: Secondary | ICD-10-CM | POA: Diagnosis not present

## 2023-03-28 DIAGNOSIS — Z1231 Encounter for screening mammogram for malignant neoplasm of breast: Secondary | ICD-10-CM | POA: Insufficient documentation

## 2023-03-28 DIAGNOSIS — M8589 Other specified disorders of bone density and structure, multiple sites: Secondary | ICD-10-CM | POA: Diagnosis not present

## 2023-03-28 NOTE — Addendum Note (Signed)
Addended by: Lynnea Ferrier T on: 03/28/2023 07:00 AM   Modules accepted: Level of Service

## 2023-03-30 NOTE — Progress Notes (Signed)
Follow up appointment for results: Follow up  Chief Complaint  Patient presents with   Follow-up    On estradiol    Blood pressure 135/78, pulse 71.  No complaints Doing well Unsure about future sexual encounters No issues or problems at present  MEDS ordered this encounter: No orders of the defined types were placed in this encounter.   Orders for this encounter: No orders of the defined types were placed in this encounter.   Impression + Management Plan   ICD-10-CM   1. Vaginal atrophy: significantly  improved on nightly estrogen cream-->continue  N95.2    OK for intercourse if patient desires, with lubrication    2. Dyspareunia, female  N94.10    as above      Follow Up: Return if symptoms worsen or fail to improve.     All questions were answered.  Past Medical History:  Diagnosis Date   AC (acromioclavicular) joint bone spurs, right    Allergy    seasonal   Anxiety    Asthma    asthmatic bronchitis   Bipolar 1 disorder, depressed    Candida infection    Cataract 2007   Depression    Genital herpes    GERD (gastroesophageal reflux disease)    Headache    Herpes simplex type 2 infection    High cholesterol    Hyperlipidemia    Hypertension    Migraine    Plantar fasciitis of right foot    Prediabetes    Rotator cuff tear    right shoulder    Past Surgical History:  Procedure Laterality Date   CATARACT EXTRACTION, BILATERAL     CHOLECYSTECTOMY  1987   KNEE SURGERY Right     OB History     Gravida  3   Para  3   Term  3   Preterm      AB      Living  3      SAB      IAB      Ectopic      Multiple      Live Births  3           Allergies  Allergen Reactions   Hydrocodone-Acetaminophen Other (See Comments)    Headaches on long term use   Milk-Related Compounds Diarrhea    Cannot drink plain milk, but can eat ice cream, and milk in cereal   Oxycodone-Acetaminophen Itching    Nightmares    Social History    Socioeconomic History   Marital status: Married    Spouse name: Homero Fellers   Number of children: 3   Years of education: Not on file   Highest education level: Not on file  Occupational History   Not on file  Tobacco Use   Smoking status: Former    Years: .5    Types: Cigarettes   Smokeless tobacco: Never   Tobacco comments:    Quit many years ago  Vaping Use   Vaping Use: Never used  Substance and Sexual Activity   Alcohol use: No   Drug use: No   Sexual activity: Yes    Birth control/protection: None    Comment: spouse vasectomy  Other Topics Concern   Not on file  Social History Narrative   Lives with husband.    14 grandchildren and 1 expected in 2023.   Caffeine- tea 1 glass   Social Determinants of Health   Financial Resource Strain: High Risk (12/06/2022)  Overall Financial Resource Strain (CARDIA)    Difficulty of Paying Living Expenses: Hard  Food Insecurity: No Food Insecurity (12/06/2022)   Hunger Vital Sign    Worried About Running Out of Food in the Last Year: Never true    Ran Out of Food in the Last Year: Never true  Transportation Needs: No Transportation Needs (12/06/2022)   PRAPARE - Administrator, Civil Service (Medical): No    Lack of Transportation (Non-Medical): No  Physical Activity: Inactive (12/06/2022)   Exercise Vital Sign    Days of Exercise per Week: 0 days    Minutes of Exercise per Session: 0 min  Stress: Stress Concern Present (12/06/2022)   [employer] of Occupational Health - Occupational Stress Questionnaire    Feeling of Stress : Rather much  Social Connections: Moderately Integrated (12/06/2022)   Social Connection and Isolation Panel [NHANES]    Frequency of Communication with Friends and Family: Three times a week    Frequency of Social Gatherings with Friends and Family: Three times a week    Attends Religious Services: 1 to 4 times per year    Active Member of Clubs or Organizations: No    Attends Probation officer: Never    Marital Status: Married    Family History  Problem Relation Age of Onset   Arthritis Mother    Asthma Mother    Hearing loss Mother    Hyperlipidemia Mother    Hypertension Mother    Miscarriages / [place] Mother    Stroke Mother    Hearing loss Father    Hypertension Father    Stroke Father    Diabetes Father    Bell's palsy Father    Cancer Brother        leukemia   Early death Brother    Arthritis Maternal Grandmother    Depression Maternal Grandmother    Diabetes Maternal Grandmother    Heart disease Maternal Grandmother        CHF   Miscarriages / Stillbirths Maternal Grandmother    Vision loss Maternal Grandmother    Alcohol abuse Maternal Grandfather    Heart attack Maternal Grandfather    Arthritis Paternal Grandmother    Heart disease Paternal Grandmother        massive heart attack   Miscarriages / Stillbirths Paternal Grandmother    Alcohol abuse Paternal Grandfather    Stroke Paternal Grandfather    Heart disease Paternal Grandfather        arteriosclerosis

## 2023-04-07 ENCOUNTER — Ambulatory Visit (HOSPITAL_COMMUNITY)
Admission: RE | Admit: 2023-04-07 | Discharge: 2023-04-07 | Disposition: A | Discharge status: Home / Self Care | Payer: Medicare Other | Source: Ambulatory Visit | Attending: Family Medicine | Admitting: Family Medicine

## 2023-04-07 DIAGNOSIS — Z1231 Encounter for screening mammogram for malignant neoplasm of breast: Secondary | ICD-10-CM | POA: Insufficient documentation

## 2023-04-09 ENCOUNTER — Other Ambulatory Visit: Payer: Self-pay

## 2023-04-09 ENCOUNTER — Ambulatory Visit
Admission: RE | Admit: 2023-04-09 | Discharge: 2023-04-09 | Disposition: A | Discharge status: Home / Self Care | Payer: Medicare Other | Source: Ambulatory Visit | Attending: Nurse Practitioner | Admitting: Nurse Practitioner

## 2023-04-09 VITALS — BP 144/88 | HR 59 | Temp 97.6°F | Resp 20

## 2023-04-09 DIAGNOSIS — N760 Acute vaginitis: Secondary | ICD-10-CM

## 2023-04-09 HISTORY — DX: Other specified disorders of bone density and structure, unspecified site: M85.80

## 2023-04-09 LAB — POCT URINALYSIS DIP (MANUAL ENTRY)
Bilirubin, UA: NEGATIVE
Blood, UA: NEGATIVE
Glucose, UA: NEGATIVE mg/dL
Ketones, POC UA: NEGATIVE mg/dL
Leukocytes, UA: NEGATIVE
Nitrite, UA: NEGATIVE
Protein Ur, POC: NEGATIVE mg/dL
Spec Grav, UA: 1.015 (ref 1.010–1.025)
Urobilinogen, UA: 0.2 E.U./dL
pH, UA: 5.5 (ref 5.0–8.0)

## 2023-04-09 MED ORDER — FLUCONAZOLE 150 MG PO TABS: 150.0000 mg | ORAL_TABLET | ORAL | 0 refills | Status: DC | PRN

## 2023-04-09 NOTE — ED Triage Notes (Signed)
Pt reports vaginal discomfort and possible "yeast infection." Pt reports has tried otc medication but reports symptoms have remained for approximately 1 week.

## 2023-04-09 NOTE — Discharge Instructions (Addendum)
The results of the cytology swab will be available within the next 48 to 72 hours.  You will also be able to see your results on MyChart. Take medication as prescribed.  Continue current medications. Recommend using unscented soap or warm water to cleanse the vaginal area to prevent irritation. Recommend eating yogurt daily. You can try using boric acid vaginal suppositories to see if this helps with the recurrence of symptoms..  You can get these over-the-counter.  You will need to insert 1 vaginal suppository each night for the first week, and then 1 weekly thereafter. I would recommend avoiding sexual intercourse until symptoms improve. As discussed, please follow-up with gynecology to try to determine the cause of recurrent vaginal symptoms and to determine further treatment. Follow-up as needed.

## 2023-04-09 NOTE — ED Provider Notes (Signed)
[facility]    CSN: [id number] Arrival date & time: 04/09/23  1145      History   Chief Complaint Chief Complaint  Patient presents with   Vaginal Itching    Pain/discomfort as well - Entered by patient    HPI Peggy Fitzgerald is a 67 y.o. female.   The history is provided by the patient.   The patient presents for complaints of vaginal itching and vaginal irritation that been present for the past week.  She also reports that she has had intermittent pain with urination.  Patient denies fever, chills, vaginal discharge, vaginal odor, urinary frequency, urgency, hesitancy, hematuria, or abdominal pain.  Patient reports a history of recurrent yeast infections.  She denies the use of recent antibiotics.  Patient states that she is sexually active.  Patient has been seen by gynecology for vaginal dryness that we get 1 it okay I will order it I thought it was ordered to [facility] is not pushing.  She states that she does use vaginal creams to help with vaginal dryness.  Patient has been using over-the-counter creams with minimal relief of her symptoms.  Past Medical History:  Diagnosis Date   AC (acromioclavicular) joint bone spurs, right    Allergy    seasonal   Anxiety    Asthma    asthmatic bronchitis   Bipolar 1 disorder, depressed (HCC)    Candida infection    Cataract 2007   Depression    Genital herpes    GERD (gastroesophageal reflux disease)    Headache    Herpes simplex type 2 infection    High cholesterol    Hyperlipidemia    Hypertension    Migraine    Osteopenia    Plantar fasciitis of right foot    Prediabetes    Rotator cuff tear    right shoulder    Patient Active Problem List   Diagnosis Date Noted   Right knee pain 10/18/2021   Obesity (BMI 30-39.9) 08/23/2021   Migraine 05/21/2018   Low back pain without sciatica 07/01/2017   Urinary tract infection without hematuria 07/01/2017   Vaginal dryness 07/01/2017   Bipolar 1 disorder,  mixed, moderate (HCC) 08/28/2016   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   Insomnia 12/18/2015   Headache 12/18/2015   Vertigo 12/18/2015   Allergy    Anxiety    Asthma    Depression    GERD (gastroesophageal reflux disease)    Hyperlipidemia    Hypertension    Herpes simplex type 2 infection    SHOULDER PAIN 04/24/2009   IMPINGEMENT SYNDROME 04/24/2009    Past Surgical History:  Procedure Laterality Date   CATARACT EXTRACTION, BILATERAL     CHOLECYSTECTOMY  1987   KNEE SURGERY Right     OB History     Gravida  3   Para  3   Term  3   Preterm      AB      Living  3      SAB      IAB      Ectopic      Multiple      Live Births  3            Home Medications    Prior to Admission medications   Medication Sig Start Date End Date Taking? Authorizing Provider  calcium-vitamin D (OSCAL WITH D) 500-5 MG-MCG tablet Take 1 tablet by mouth.   Yes [provider]  fluconazole (  DIFLUCAN) 150 MG tablet Take 1 tablet (150 mg total) by mouth every three (3) days as needed. 04/09/23  Yes Brendolyn Stockley-Warren, Sadie Haber, NP  acyclovir cream (ZOVIRAX) 5 % APPLY 1 APPLICATION TOPICALLY TWICE A DAY AS NEEDED FOR OUTBREAKS 10/23/21   Donita Brooks, MD  atorvastatin (LIPITOR) 40 MG tablet TAKE 1 TABLET DAILY 01/04/21   Donita Brooks, MD  clobetasol (TEMOVATE) 0.05 % external solution APPLY 1 APPLICATION TOPICALLY TWICE A DAY AS NEEDED, APPLY TO SCALP AS NEEDED FOR IRRITATION 02/14/22   Donita Brooks, MD  clotrimazole-betamethasone (LOTRISONE) cream Apply 1 application. topically 2 (two) times daily. 04/25/22   Donita Brooks, MD  diclofenac (CATAFLAM) 50 MG tablet Take 50-100 mg at onset of migraine. Max dose 2 pills in 24 hours 09/26/22   Ocie Doyne, MD  estradiol (ESTRACE) 0.1 MG/GM vaginal cream PLACE 1 GRAM VAGINALLY AT BEDTIME 08/29/22   Lazaro Arms, MD  fluticasone (FLONASE) 50 MCG/ACT nasal spray Place 2 sprays into both nostrils daily. 03/20/23    Donita Brooks, MD  hydrOXYzine (ATARAX/VISTARIL) 25 MG tablet Take 1 tablet (25 mg total) by mouth 3 (three) times daily as needed. 09/06/20   [facility], Velna Hatchet, MD  ketoconazole (NIZORAL) 2 % cream Apply 1 Application topically 2 (two) times daily as needed for irritation. 07/07/22   Wallis Bamberg, PA-C  lamoTRIgine (LAMICTAL) 25 MG tablet Take 1 tablet (25 mg total) by mouth daily. 08/30/16   Withrow, John C, FNP  LATUDA 80 MG TABS tablet Take 80 mg by mouth daily.  02/11/17   [provider]  LORazepam (ATIVAN) 0.5 MG tablet Take 1 tablet (0.5 mg total) by mouth every 8 (eight) hours as needed for anxiety (itching). 09/21/20   Donita Brooks, MD  lurasidone (LATUDA) 20 MG TABS tablet Take 20 mg by mouth every morning. 04/03/22   [provider]  meclizine (ANTIVERT) 25 MG tablet TAKE 1 TABLET AS NEEDED FOR DIZZINESS 12/20/22   Donita Brooks, MD  methocarbamol (ROBAXIN) 500 MG tablet TAKE 1 TABLET FOUR TIMES A DAY 05/03/22   Donita Brooks, MD  metoprolol succinate (TOPROL-XL) 100 MG 24 hr tablet TAKE 1 TABLET AT BEDTIME WITH OR IMMEDIATELY FOLLOWING A MEAL  (CALL CLINIC TO SCHEDULE APPOINTMENT FOR FUTURE REFILLS. [phone]) 08/06/21   Donita Brooks, MD  montelukast (SINGULAIR) 10 MG tablet TAKE 1 TABLET(10 MG) BY MOUTH AT BEDTIME 02/26/23   Donita Brooks, MD  mupirocin ointment (BACTROBAN) 2 % Apply 1 application. topically 2 (two) times daily. 04/11/22   Felishia Wartman-Warren, Sadie Haber, NP  ondansetron (ZOFRAN-ODT) 4 MG disintegrating tablet Take 1 tablet (4 mg total) by mouth every 8 (eight) hours as needed for nausea or vomiting. 11/13/22   Particia Nearing, PA-C  PREVIDENT 5000 BOOSTER PLUS 1.1 % PSTE SMARTSIG:To Teeth 3 Times Daily 10/07/22   [provider]  promethazine (PHENERGAN) 25 MG tablet TAKE 1 TABLET EVERY 6 HOURS AS NEEDED FOR NAUSEA OR VOMITING 04/08/22   Donita Brooks, MD  rizatriptan (MAXALT) 10 MG tablet Take 1 tablet (10 mg total) by mouth  as needed for migraine. May repeat in 2 hours if needed 09/26/22   Ocie Doyne, MD  triamcinolone ointment (KENALOG) 0.5 % Apply 1 application. topically 2 (two) times daily. 04/11/22   Gorman Safi-Warren, Sadie Haber, NP  valACYclovir (VALTREX) 500 MG tablet TAKE 1 TABLET DAILY 12/18/22   Donita Brooks, MD  zolpidem (AMBIEN) 5 MG tablet Take 6.5 mg by  mouth at bedtime as needed. 6.5 11/10/21   [provider]    Family History Family History  Problem Relation Age of Onset   Arthritis Mother    Asthma Mother    Hearing loss Mother    Hyperlipidemia Mother    Hypertension Mother    Miscarriages / [place] Mother    Stroke Mother    Hearing loss Father    Hypertension Father    Stroke Father    Diabetes Father    Bell's palsy Father    Cancer Brother        leukemia   Early death Brother    Arthritis Maternal Grandmother    Depression Maternal Grandmother    Diabetes Maternal Grandmother    Heart disease Maternal Grandmother        CHF   Miscarriages / Stillbirths Maternal Grandmother    Vision loss Maternal Grandmother    Alcohol abuse Maternal Grandfather    Heart attack Maternal Grandfather    Arthritis Paternal Grandmother    Heart disease Paternal Grandmother        massive heart attack   Miscarriages / Stillbirths Paternal Grandmother    Alcohol abuse Paternal Grandfather    Stroke Paternal Grandfather    Heart disease Paternal Grandfather        arteriosclerosis    Social History Social History   Tobacco Use   Smoking status: Former    Years: .5    Types: Cigarettes   Smokeless tobacco: Never   Tobacco comments:    Quit many years ago  Vaping Use   Vaping Use: Never used  Substance Use Topics   Alcohol use: No   Drug use: No     Allergies   Hydrocodone-acetaminophen, Milk-related compounds, and Oxycodone-acetaminophen   Review of Systems Review of Systems Per HPI  Physical Exam Triage Vital Signs ED Triage Vitals  Enc Vitals Group      BP 04/09/23 1155 (!) 144/88     Pulse Rate 04/09/23 1155 (!) 59     Resp 04/09/23 1155 20     Temp 04/09/23 1155 97.6 F (36.4 C)     Temp Source 04/09/23 1155 Oral     SpO2 04/09/23 1155 94 %     Weight --      Height --      Head Circumference --      Peak Flow --      Pain Score 04/09/23 1156 2     Pain Loc --      Pain Edu? --      Excl. in GC? --    No data found.  Updated Vital Signs BP (!) 144/88 (BP Location: Right Arm)   Pulse (!) 59   Temp 97.6 F (36.4 C) (Oral)   Resp 20   SpO2 94%   Visual Acuity Right Eye Distance:   Left Eye Distance:   Bilateral Distance:    Right Eye Near:   Left Eye Near:    Bilateral Near:     Physical Exam Vitals and nursing note reviewed.  Constitutional:      General: [name] not in acute distress.    Appearance: Normal appearance.  HENT:     Head: Normocephalic.  Eyes:     Extraocular Movements: Extraocular movements intact.     Conjunctiva/sclera: Conjunctivae normal.     Pupils: Pupils are equal, round, and reactive to light.  Cardiovascular:     Rate and Rhythm: Normal rate and regular rhythm.  Pulses: Normal pulses.     Heart sounds: Normal heart sounds.  Pulmonary:     Effort: Pulmonary effort is normal.     Breath sounds: Normal breath sounds.  Abdominal:     General: Bowel sounds are normal.     Palpations: Abdomen is soft.     Tenderness: There is no abdominal tenderness.  Genitourinary:    Comments: GU exam deferred, self swab performed  Musculoskeletal:     Cervical back: Normal range of motion.  Skin:    General: Skin is warm and dry.  Neurological:     General: No focal deficit present.     Mental Status: [name] alert and oriented to person, place, and time.  Psychiatric:        Mood and Affect: Mood normal.        Behavior: Behavior normal.      UC Treatments / Results  Labs (all labs ordered are listed, but only abnormal results are displayed) Labs Reviewed  POCT URINALYSIS DIP  (MANUAL ENTRY)  CERVICOVAGINAL ANCILLARY ONLY    EKG   Radiology MM 3D SCREENING MAMMOGRAM BILATERAL BREAST  Result Date: 04/09/2023 CLINICAL DATA:  Screening. EXAM: DIGITAL SCREENING BILATERAL MAMMOGRAM WITH TOMOSYNTHESIS AND CAD TECHNIQUE: Bilateral screening digital craniocaudal and mediolateral oblique mammograms were obtained. Bilateral screening digital breast tomosynthesis was performed. The images were evaluated with computer-aided detection. COMPARISON:  Previous exam(s). ACR Breast Density Category b: There are scattered areas of fibroglandular density. FINDINGS: There are no findings suspicious for malignancy. IMPRESSION: No mammographic evidence of malignancy. A result letter of this screening mammogram will be mailed directly to the patient. RECOMMENDATION: Screening mammogram in one year. (Code:SM-B-01Y) BI-RADS CATEGORY  1: Negative. Electronically Signed   By: Jacob Moores M.D.   On: 04/09/2023 12:27    Procedures Procedures (including critical care time)  Medications Ordered in UC Medications - No data to display  Initial Impression / Assessment and Plan / UC Course  I have reviewed the triage vital signs and the nursing notes.  Pertinent labs & imaging results that were available during my care of the patient were reviewed by me and considered in my medical decision making (see chart for details).  The patient is well-appearing, she is in no acute distress, vital signs are stable.  Cytology swab is pending.  Urinalysis was negative for urinary tract infection.  Will treat patient empirically with Diflucan 150 mg tablets.  Supportive care recommendations were provided and discussed with the patient to include avoiding use of soaps or body washes to cleanse the vaginal area, avoiding sitting in a bathtub, eating yogurt daily, and to use probiotics when she takes an antibiotic.  Patient was advised to follow-up with gynecology as this appears to be a recurrent problem for  her.  Patient is in agreement with this plan of care and verbalizes understanding.  All questions were answered.  Patient stable for discharge.  Final Clinical Impressions(s) / UC Diagnoses   Final diagnoses:  Vaginitis and vulvovaginitis     Discharge Instructions      The results of the cytology swab will be available within the next 48 to 72 hours.  You will also be able to see your results on MyChart. Take medication as prescribed.  Continue current medications. Recommend using unscented soap or warm water to cleanse the vaginal area to prevent irritation. Recommend eating yogurt daily. You can try using boric acid vaginal suppositories to see if this helps with the recurrence of symptoms.Marland Kitchen  You can get these over-the-counter.  You will need to insert 1 vaginal suppository each night for the first week, and then 1 weekly thereafter. I would recommend avoiding sexual intercourse until symptoms improve. As discussed, please follow-up with gynecology to try to determine the cause of recurrent vaginal symptoms and to determine further treatment. Follow-up as needed.     ED Prescriptions     Medication Sig Dispense Auth. Provider   fluconazole (DIFLUCAN) 150 MG tablet Take 1 tablet (150 mg total) by mouth every three (3) days as needed. 3 tablet Keelan Tripodi-Warren, Sadie Haber, NP      PDMP not reviewed this encounter.   Abran Cantor, NP 04/09/23 1256

## 2023-04-10 ENCOUNTER — Encounter: Payer: Self-pay | Admitting: Family Medicine

## 2023-04-10 LAB — CERVICOVAGINAL ANCILLARY ONLY
Bacterial Vaginitis (gardnerella): NEGATIVE
Candida Glabrata: NEGATIVE
Candida Vaginitis: POSITIVE — AB
Chlamydia: NEGATIVE
Comment: NEGATIVE
Comment: NEGATIVE
Comment: NEGATIVE
Comment: NEGATIVE
Comment: NEGATIVE
Comment: NORMAL
Neisseria Gonorrhea: NEGATIVE
Trichomonas: NEGATIVE

## 2023-04-21 NOTE — Progress Notes (Unsigned)
04/22/23 ALL: She returns for Botox. Last procedure 01/2023. She reports daily headaches continue. She may have 3-4 bad migraines per month. She continues Excedrin most every day. Usually 3-4 tablets of rizatriptan. [name] drinking C[employer] Ener[employer] Drinks regularly. Maybe 16 ounces of water daily. Advised of rebound headaches and encouraged her to increase water and decrease caffeine.      Consent Form Botulism Toxin Injection For Chronic Migraine    Reviewed orally with patient, additionally signature is on file:  Botulism toxin has been approved by the Federal drug administration for treatment of chronic migraine. Botulism toxin does not cure chronic migraine and it may not be effective in some patients.  The administration of botulism toxin is accomplished by injecting a small amount of toxin into the muscles of the neck and head. Dosage must be titrated for each individual. Any benefits resulting from botulism toxin tend to wear off after 3 months with a repeat injection required if benefit is to be maintained. Injections are usually done every 3-4 months with maximum effect peak achieved by about 2 or 3 weeks. Botulism toxin is expensive and you should be sure of what costs you will incur resulting from the injection.  The side effects of botulism toxin use for chronic migraine may include:   -Transient, and usually mild, facial weakness with facial injections  -Transient, and usually mild, head or neck weakness with head/neck injections  -Reduction or loss of forehead facial animation due to forehead muscle weakness  -Eyelid drooping  -Dry eye  -Pain at the site of injection or bruising at the site of injection  -Double vision  -Potential unknown long term risks   Contraindications: You should not have Botox if you are pregnant, nursing, allergic to albumin, have an infection, skin condition, or muscle weakness at the site of the injection, or have myasthenia gravis, Lambert-Eaton  syndrome, or ALS.  It is also possible that as with any injection, there may be an allergic reaction or no effect from the medication. Reduced effectiveness after repeated injections is sometimes seen and rarely infection at the injection site may occur. All care will be taken to prevent these side effects. If therapy is given over a long time, atrophy and wasting in the muscle injected may occur. Occasionally the patient's become refractory to treatment because they develop antibodies to the toxin. In this event, therapy needs to be modified.  I have read the above information and consent to the administration of botulism toxin.    BOTOX PROCEDURE NOTE FOR MIGRAINE HEADACHE  Contraindications and precautions discussed with patient(above). Aseptic procedure was observed and patient tolerated procedure. Procedure performed by Shawnie Dapper, FNP-C.   The condition has existed for more than 6 months, and pt does not have a diagnosis of ALS, Myasthenia Gravis or Lambert-Eaton Syndrome.  Risks and benefits of injections discussed and pt agrees to proceed with the procedure.  Written consent obtained  These injections are medically necessary. Pt  receives good benefits from these injections. These injections do not cause sedations or hallucinations which the oral therapies may cause.   Description of procedure:  The patient was placed in a sitting position. The standard protocol was used for Botox as follows, with 5 units of Botox injected at each site:  -Procerus muscle, midline injection  -Corrugator muscle, bilateral injection  -Frontalis muscle, bilateral injection, with 2 sites each side, medial injection was performed in the upper one third of the frontalis muscle, in the region vertical  from the medial inferior edge of the superior orbital rim. The lateral injection was again in the upper one third of the forehead vertically above the lateral limbus of the cornea, 1.5 cm lateral to the medial  injection site.  -Temporalis muscle injection, 4 sites, bilaterally. The first injection was 3 cm above the tragus of the ear, second injection site was 1.5 cm to 3 cm up from the first injection site in line with the tragus of the ear. The third injection site was 1.5-3 cm forward between the first 2 injection sites. The fourth injection site was 1.5 cm posterior to the second injection site. 5th site laterally in the temporalis  muscleat the level of the outer canthus.  -Occipitalis muscle injection, 3 sites, bilaterally. The first injection was done one half way between the occipital protuberance and the tip of the mastoid process behind the ear. The second injection site was done lateral and superior to the first, 1 fingerbreadth from the first injection. The third injection site was 1 fingerbreadth superiorly and medially from the first injection site.  -Cervical paraspinal muscle injection, 2 sites, bilaterally. The first injection site was 1 cm from the midline of the cervical spine, 3 cm inferior to the lower border of the occipital protuberance. The second injection site was 1.5 cm superiorly and laterally to the first injection site.  -Trapezius muscle injection was performed at 3 sites, bilaterally. The first injection site was in the upper trapezius muscle halfway between the inflection point of the neck, and the acromion. The second injection site was one half way between the acromion and the first injection site. The third injection was done between the first injection site and the inflection point of the neck.   Will return for repeat injection in 3 months.   A total of 200 units of Botox was prepared, 155 units of Botox was injected as documented above, any Botox not injected was wasted. The patient tolerated the procedure well, there were no complications of the above procedure.

## 2023-04-22 ENCOUNTER — Ambulatory Visit (INDEPENDENT_AMBULATORY_CARE_PROVIDER_SITE_OTHER): Payer: Medicare Other | Admitting: Family Medicine

## 2023-04-22 ENCOUNTER — Encounter: Payer: Self-pay | Admitting: Family Medicine

## 2023-04-22 ENCOUNTER — Ambulatory Visit: Payer: Medicare Other | Admitting: Adult Health

## 2023-04-22 VITALS — BP 161/86 | HR 63 | Ht 66.0 in | Wt 194.0 lb

## 2023-04-22 DIAGNOSIS — G43701 Chronic migraine without aura, not intractable, with status migrainosus: Secondary | ICD-10-CM

## 2023-04-22 MED ORDER — ONABOTULINUMTOXINA 100 UNITS IJ SOLR
155.0000 [IU] | Freq: Once | INTRAMUSCULAR | Status: AC
Start: 2023-04-22 — End: 2023-04-22
  Administered 2023-04-22: 155 [IU] via INTRAMUSCULAR

## 2023-04-22 NOTE — Progress Notes (Signed)
Botox-100U x 2vials Lot: [id number] Expiration: 02/2025 NDC: 6213-0865-78  Bacteriostatic 0.9% Sodium Chloride- 4mL total ION:[id number] Expiration:10/2024 NDC: 13244-010-27  Witnessed by: Leandra Kern, RMA  Buy/bill

## 2023-04-23 ENCOUNTER — Encounter: Payer: Self-pay | Admitting: Obstetrics & Gynecology

## 2023-04-24 ENCOUNTER — Encounter: Payer: Self-pay | Admitting: Family Medicine

## 2023-04-28 ENCOUNTER — Ambulatory Visit: Payer: Medicare Other | Admitting: Obstetrics & Gynecology

## 2023-04-28 ENCOUNTER — Encounter: Payer: Self-pay | Admitting: Obstetrics & Gynecology

## 2023-04-28 VITALS — BP 119/85 | HR 70 | Ht 66.0 in | Wt 190.0 lb

## 2023-04-28 DIAGNOSIS — B3731 Acute candidiasis of vulva and vagina: Secondary | ICD-10-CM | POA: Diagnosis not present

## 2023-04-28 MED ORDER — TERCONAZOLE 0.4 % VA CREA: 1.0000 | TOPICAL_CREAM | Freq: Every day | VAGINAL | 6 refills | Status: AC

## 2023-04-28 MED ORDER — CVS BORIC ACID 600 MG VA SUPP: 1.0000 | Freq: Every evening | VAGINAL | 11 refills | Status: DC

## 2023-04-28 NOTE — Progress Notes (Signed)
       Chief Complaint  Patient presents with   Follow-up    Recurrent yeast infections    Blood pressure 119/85, pulse 70, height 5\' 6"  (1.676 m), weight 190 lb (86.2 kg).  67 y.o. [id number] No LMP recorded. Patient is postmenopausal. The current method of family planning is post menopausal status.  Subjective Vaginal discharge for 2weeks Itching yes Irritation no Odor no Similar to previous yes  Previous treatment Diflucan from urgent care  Objective Vulva:  yeast evident Vagina:  normal mucosa, curd-like discharge Cervix:   Uterus:   Adnexa: ovaries:,    Vagina painted with GV     Pertinent ROS No burning with urination, frequency or urgency No nausea, vomiting or diarrhea Nor fever chills or other constitutional symptoms   Labs or studies none     Impression Diagnoses this Encounter::   ICD-10-CM   1. Yeast vaginitis, recurrent  B37.31    Was treated with diflucan urgent care but exam today perisitent-->likely resistent subtype.  Rec OTC boric acid 600 mg supp qhs x 14      Established relevant diagnosis(es): Vaginal atrophy improved on vaginal estrogen  Plan/Recommendations: Meds ordered this encounter  Medications   terconazole (TERAZOL 7) 0.4 % vaginal cream    Sig: Place 1 applicator vaginally at bedtime.    Dispense:  45 g    Refill:  6   Boric Acid Vaginal ([facility] BORIC ACID) 600 MG SUPP    Sig: Place 1 suppository vaginally at bedtime for 14 days.    Dispense:  14 suppository    Refill:  11    Labs or Scans Ordered: No orders of the defined types were placed in this encounter.   Management:: Boric acid suppositories x 14 days   Follow up Return if symptoms worsen or fail to improve.    All questions were answered.

## 2023-05-01 DIAGNOSIS — F411 Generalized anxiety disorder: Secondary | ICD-10-CM | POA: Diagnosis not present

## 2023-05-01 DIAGNOSIS — F3132 Bipolar disorder, current episode depressed, moderate: Secondary | ICD-10-CM | POA: Diagnosis not present

## 2023-05-01 DIAGNOSIS — F319 Bipolar disorder, unspecified: Secondary | ICD-10-CM | POA: Diagnosis not present

## 2023-05-06 DIAGNOSIS — F3131 Bipolar disorder, current episode depressed, mild: Secondary | ICD-10-CM | POA: Diagnosis not present

## 2023-05-21 DIAGNOSIS — H10022 Other mucopurulent conjunctivitis, left eye: Secondary | ICD-10-CM | POA: Diagnosis not present

## 2023-05-22 ENCOUNTER — Encounter: Payer: Self-pay | Admitting: Obstetrics & Gynecology

## 2023-05-22 DIAGNOSIS — Z1211 Encounter for screening for malignant neoplasm of colon: Secondary | ICD-10-CM | POA: Diagnosis not present

## 2023-05-23 DIAGNOSIS — F3131 Bipolar disorder, current episode depressed, mild: Secondary | ICD-10-CM | POA: Diagnosis not present

## 2023-05-23 MED ORDER — ESTRADIOL 0.1 MG/GM VA CREA: TOPICAL_CREAM | VAGINAL | 7 refills | Status: DC

## 2023-05-23 NOTE — Addendum Note (Signed)
Addended by: Lazaro Arms on: 05/23/2023 08:32 PM   Modules accepted: Orders

## 2023-05-28 DIAGNOSIS — F3131 Bipolar disorder, current episode depressed, mild: Secondary | ICD-10-CM | POA: Diagnosis not present

## 2023-05-28 LAB — COLOGUARD: COLOGUARD: POSITIVE — AB

## 2023-05-29 ENCOUNTER — Other Ambulatory Visit: Payer: Self-pay | Admitting: Family Medicine

## 2023-05-29 DIAGNOSIS — R195 Other fecal abnormalities: Secondary | ICD-10-CM

## 2023-06-01 ENCOUNTER — Encounter: Payer: Self-pay | Admitting: Family Medicine

## 2023-06-02 ENCOUNTER — Encounter: Payer: Self-pay | Admitting: Family Medicine

## 2023-06-03 ENCOUNTER — Other Ambulatory Visit: Payer: Self-pay

## 2023-06-03 DIAGNOSIS — I10 Essential (primary) hypertension: Secondary | ICD-10-CM

## 2023-06-03 DIAGNOSIS — E785 Hyperlipidemia, unspecified: Secondary | ICD-10-CM

## 2023-06-03 DIAGNOSIS — E669 Obesity, unspecified: Secondary | ICD-10-CM

## 2023-06-03 MED ORDER — SEMAGLUTIDE-WEIGHT MANAGEMENT 0.5 MG/0.5ML ~~LOC~~ SOAJ
0.5000 mg | SUBCUTANEOUS | 1 refills | Status: DC
Start: 2023-06-03 — End: 2023-06-24

## 2023-06-04 DIAGNOSIS — F3131 Bipolar disorder, current episode depressed, mild: Secondary | ICD-10-CM | POA: Diagnosis not present

## 2023-06-09 ENCOUNTER — Encounter: Payer: Self-pay | Admitting: Gastroenterology

## 2023-06-09 ENCOUNTER — Ambulatory Visit (INDEPENDENT_AMBULATORY_CARE_PROVIDER_SITE_OTHER): Payer: Medicare Other | Admitting: Gastroenterology

## 2023-06-09 ENCOUNTER — Encounter: Payer: Self-pay | Admitting: *Deleted

## 2023-06-09 ENCOUNTER — Other Ambulatory Visit: Payer: Self-pay | Admitting: *Deleted

## 2023-06-09 ENCOUNTER — Encounter: Payer: Self-pay | Admitting: Family Medicine

## 2023-06-09 VITALS — BP 122/78 | HR 69 | Temp 97.6°F | Ht 66.0 in | Wt 192.6 lb

## 2023-06-09 DIAGNOSIS — Z1211 Encounter for screening for malignant neoplasm of colon: Secondary | ICD-10-CM

## 2023-06-09 DIAGNOSIS — R195 Other fecal abnormalities: Secondary | ICD-10-CM

## 2023-06-09 MED ORDER — PEG 3350-KCL-NA BICARB-NACL 420 G PO SOLR: 4000.0000 mL | Freq: Once | ORAL | 0 refills | Status: AC

## 2023-06-09 NOTE — Progress Notes (Addendum)
GI Office Note    Referring Provider: Donita Brooks, MD Primary Care Physician:  Donita Brooks, MD  Primary Gastroenterologist: Dr. Tasia Catchings  Chief Complaint   Chief Complaint  Patient presents with   New Patient (Initial Visit)    Pt referred for pos cologuard   History of Present Illness   Peggy Fitzgerald is a 67 y.o. female presenting today at the request of Donita Brooks, MD for positive Cologuard.   Positive Cologuard 05/22/23. Negative Cologuard in 2021.   Dr Matthias Hughs performed her prior colonoscopies many years ago in [place]. Denied history of polyps. States she also had an EGD at some point for acid reflux.   Denies any melena or brbpr, abdominal pain, lack of appetite, unintentional weight loss, change in bowel habits, shortness of breath, chest pain, reflux, dysphagia.   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   Has had some weight gain. Likes to eat comfort food.   No family history of colon cancer or colon polyps.   States she was recently told that with her blood work that she has some mild chronic kidney disease. GFR 59, Cr 1.04.  Has not yet started wegovy.    Current Outpatient Medications  Medication Sig Dispense Refill   acyclovir cream (ZOVIRAX) 5 % APPLY 1 APPLICATION TOPICALLY TWICE A DAY AS NEEDED FOR OUTBREAKS 15 g 3   atorvastatin (LIPITOR) 40 MG tablet TAKE 1 TABLET DAILY 90 tablet 3   calcium-vitamin D (OSCAL WITH D) 500-5 MG-MCG tablet Take 1 tablet by mouth.     clobetasol (TEMOVATE) 0.05 % external solution APPLY 1 APPLICATION TOPICALLY TWICE A DAY AS NEEDED, APPLY TO SCALP AS NEEDED FOR IRRITATION 150 mL 3   clotrimazole-betamethasone (LOTRISONE) cream Apply 1 application. topically 2 (two) times daily. 45 g 0   diclofenac (CATAFLAM) 50 MG tablet Take 50-100 mg at onset of migraine. Max dose 2 pills in 24 hours 45 tablet 4   estradiol (ESTRACE) 0.1 MG/GM vaginal cream PLACE 1 GRAM VAGINALLY  AT BEDTIME 42.5 g 7   fluconazole (DIFLUCAN) 150 MG tablet Take 1 tablet (150 mg total) by mouth every three (3) days as needed. 3 tablet 0   fluticasone (FLONASE) 50 MCG/ACT nasal spray Place 2 sprays into both nostrils daily. 16 g 6   hydrOXYzine (ATARAX/VISTARIL) 25 MG tablet Take 1 tablet (25 mg total) by mouth 3 (three) times daily as needed.     ketoconazole (NIZORAL) 2 % cream Apply 1 Application topically 2 (two) times daily as needed for irritation. 60 g 1   lamoTRIgine (LAMICTAL) 25 MG tablet Take 1 tablet (25 mg total) by mouth daily. 14 tablet 0   LATUDA 80 MG TABS tablet Take 80 mg by mouth daily.      lurasidone (LATUDA) 20 MG TABS tablet Take 20 mg by mouth every morning.     meclizine (ANTIVERT) 25 MG tablet TAKE 1 TABLET AS NEEDED FOR DIZZINESS 90 tablet 3   methocarbamol (ROBAXIN) 500 MG tablet TAKE 1 TABLET FOUR TIMES A DAY 90 tablet 14   metoprolol succinate (TOPROL-XL) 100 MG 24 hr tablet TAKE 1 TABLET AT BEDTIME WITH OR IMMEDIATELY FOLLOWING A MEAL  (CALL CLINIC TO SCHEDULE APPOINTMENT FOR FUTURE REFILLS. [phone]) 90 tablet 3   montelukast (SINGULAIR) 10 MG tablet TAKE 1 TABLET(10 MG) BY MOUTH AT BEDTIME 90 tablet 0   mupirocin ointment (BACTROBAN) 2 % Apply 1 application. topically 2 (  two) times daily. 30 g 0   PREVIDENT 5000 BOOSTER PLUS 1.1 % PSTE SMARTSIG:To Teeth 3 Times Daily     promethazine (PHENERGAN) 25 MG tablet TAKE 1 TABLET EVERY 6 HOURS AS NEEDED FOR NAUSEA OR VOMITING 90 tablet 14   rizatriptan (MAXALT) 10 MG tablet Take 1 tablet (10 mg total) by mouth as needed for migraine. May repeat in 2 hours if needed 30 tablet 4   Semaglutide-Weight Management 0.5 MG/0.5ML SOAJ Inject 0.5 mg into the skin once a week. (Patient not taking: Reported on 06/09/2023) 2 mL 1   terconazole (TERAZOL 7) 0.4 % vaginal cream Place 1 applicator vaginally at bedtime. 45 g 6   triamcinolone ointment (KENALOG) 0.5 % Apply 1 application. topically 2 (two) times daily. 45 g 0    zolpidem (AMBIEN) 5 MG tablet Take 6.5 mg by mouth at bedtime as needed. 6.5     Boric Acid Vaginal ([employer] BORIC ACID) 600 MG SUPP Place 1 suppository vaginally at bedtime for 14 days. 14 suppository 11   LORazepam (ATIVAN) 0.5 MG tablet Take 1 tablet (0.5 mg total) by mouth every 8 (eight) hours as needed for anxiety (itching). (Patient not taking: Reported on 04/28/2023) 30 tablet 0   ondansetron (ZOFRAN-ODT) 4 MG disintegrating tablet Take 1 tablet (4 mg total) by mouth every 8 (eight) hours as needed for nausea or vomiting. (Patient not taking: Reported on 06/09/2023) 20 tablet 0   valACYclovir (VALTREX) 500 MG tablet TAKE 1 TABLET DAILY (Patient not taking: Reported on 06/09/2023) 90 tablet 0   No current facility-administered medications for this visit.    Past Medical History:  Diagnosis Date   AC (acromioclavicular) joint bone spurs, right    Allergy    seasonal   Anxiety    Asthma    asthmatic bronchitis   Bipolar 1 disorder, depressed (HCC)    Candida infection    Cataract 2007   Depression    Genital herpes    GERD (gastroesophageal reflux disease)    Headache    Herpes simplex type 2 infection    High cholesterol    Hyperlipidemia    Hypertension    Migraine    Osteopenia    Plantar fasciitis of right foot    Prediabetes    Rotator cuff tear    right shoulder    Past Surgical History:  Procedure Laterality Date   CATARACT EXTRACTION, BILATERAL     CHOLECYSTECTOMY  1987   KNEE SURGERY Right     Family History  Problem Relation Age of Onset   Arthritis Mother    Asthma Mother    Hearing loss Mother    Hyperlipidemia Mother    Hypertension Mother    Miscarriages / [place] Mother    Stroke Mother    Hearing loss Father    Hypertension Father    Stroke Father    Diabetes Father    Bell's palsy Father    Cancer Brother        leukemia   Early death Brother    Arthritis Maternal Grandmother    Depression Maternal Grandmother    Diabetes Maternal  Grandmother    Heart disease Maternal Grandmother        CHF   Miscarriages / Stillbirths Maternal Grandmother    Vision loss Maternal Grandmother    Alcohol abuse Maternal Grandfather    Heart attack Maternal Grandfather    Arthritis Paternal Grandmother    Heart disease Paternal Grandmother  massive heart attack   Miscarriages / Stillbirths Paternal Grandmother    Alcohol abuse Paternal Grandfather    Stroke Paternal Grandfather    Heart disease Paternal Grandfather        arteriosclerosis    Allergies as of 06/09/2023 - Review Complete 06/09/2023  Allergen Reaction Noted   Hydrocodone-acetaminophen Other (See Comments) 04/18/2022   Milk-related compounds Diarrhea 08/27/2016   Oxycodone-acetaminophen Itching     Social History   Socioeconomic History   Marital status: Married    Spouse name: Homero Fellers   Number of children: 3   Years of education: Not on file   Highest education level: Not on file  Occupational History   Not on file  Tobacco Use   Smoking status: Former    Years: .5    Types: Cigarettes   Smokeless tobacco: Never   Tobacco comments:    Quit many years ago  Vaping Use   Vaping Use: Never used  Substance and Sexual Activity   Alcohol use: No   Drug use: No   Sexual activity: Yes    Birth control/protection: None    Comment: spouse vasectomy  Other Topics Concern   Not on file  Social History Narrative   Lives with husband.    14 grandchildren and 1 expected in 2023.   Caffeine- tea 1 glass   Social Determinants of Health   Financial Resource Strain: High Risk (12/06/2022)   Overall Financial Resource Strain (CARDIA)    Difficulty of Paying Living Expenses: Hard  Food Insecurity: No Food Insecurity (12/06/2022)   Hunger Vital Sign    Worried About Running Out of Food in the Last Year: Never true    Ran Out of Food in the Last Year: Never true  Transportation Needs: No Transportation Needs (12/06/2022)   PRAPARE - Therapist, art (Medical): No    Lack of Transportation (Non-Medical): No  Physical Activity: Inactive (12/06/2022)   Exercise Vital Sign    Days of Exercise per Week: 0 days    Minutes of Exercise per Session: 0 min  Stress: Stress Concern Present (12/06/2022)   [employer] of Occupational Health - Occupational Stress Questionnaire    Feeling of Stress : Rather much  Social Connections: Moderately Integrated (12/06/2022)   Social Connection and Isolation Panel [NHANES]    Frequency of Communication with Friends and Family: Three times a week    Frequency of Social Gatherings with Friends and Family: Three times a week    Attends Religious Services: 1 to 4 times per year    Active Member of Clubs or Organizations: No    Attends Banker Meetings: Never    Marital Status: Married  Catering manager Violence: Not At Risk (12/06/2022)   Humiliation, Afraid, Rape, and Kick questionnaire    Fear of Current or Ex-Partner: No    Emotionally Abused: No    Physically Abused: No    Sexually Abused: No     Review of Systems   Gen: Denies any fever, chills, fatigue, weight loss, lack of appetite.  CV: Denies chest pain, heart palpitations, peripheral edema, syncope.  Resp: Denies shortness of breath at rest or with exertion. Denies wheezing or cough.  GI: see HPI GU : Denies urinary burning, urinary frequency, urinary hesitancy MS: Denies joint pain, muscle weakness, cramps, or limitation of movement.  Derm: Denies rash, itching, dry skin Psych: + anxiety, depression. Denies memory loss, and confusion Heme: Denies bruising, bleeding, and enlarged lymph  nodes.   Physical Exam   BP 122/78   Pulse 69   Temp 97.6 F (36.4 C)   Ht 5\' 6"  (1.676 m)   Wt 192 lb 9.6 oz (87.4 kg)   BMI 31.09 kg/m   General:   Alert and oriented. Pleasant and cooperative. Well-nourished and well-developed.  Head:  Normocephalic and atraumatic. Eyes:  Without icterus, sclera clear  and conjunctiva pink.  Ears:  Normal auditory acuity. Mouth:  No deformity or lesions, oral mucosa pink.  Lungs:  Clear to auscultation bilaterally. No wheezes, rales, or rhonchi. No distress.  Heart:  S1, S2 present without murmurs appreciated.  Abdomen:  +BS, soft, non-tender and non-distended. No HSM noted. No guarding or rebound. No masses appreciated. Rectal:  Deferred  Msk:  Symmetrical without gross deformities. Normal posture. Extremities:  Without edema. Neurologic:  Alert and  oriented x4;  grossly normal neurologically. Skin:  Intact without significant lesions or rashes. Psych:  Alert and cooperative. Normal mood and affect.   Assessment   Peggy Fitzgerald is a 67 y.o. female with a history of anxiety, asthma, bipolar 1, depression, GERD, HLD, HTN, osteopenia, and prediabetes presenting today for evaluation due to positive Cologuard.  Positive Cologuard: Positive Cologuard in June.  Prior Cologuard in 2021 negative.  She reports a history of 2 prior colonoscopies performed in [place] many years ago by Dr. Matthias Hughs who is now retired and she is unsure which practice it was performed at.  She denies any history of colon polyps.  No alarm symptoms present at this time.  Also without any change in bowel habits. She also denies any family history of colon cancer or colon polyps.  We discussed the evidence behind performing a colonoscopy with a positive Cologuard and she has agreed to proceed with screening colonoscopy.  PLAN   Proceed with colonoscopy with propofol by Dr. Tasia Catchings in near future: the risks, benefits, and alternatives have been discussed with the patient in detail. The patient states understanding and desires to proceed. ASA 2 Trylite prep Do not start wegovy until after procedure. 1.5 days of clears    Brooke Bonito, MSN, FNP-BC, AGACNP-BC [facility]   I have reviewed the note and agree with the APP's assessment as described in  this note  Vista Lawman, MD Gastroenterology and Hepatology [facility]

## 2023-06-09 NOTE — Patient Instructions (Addendum)
We are scheduling you for a colonoscopy in the near future with Dr. Tasia Catchings.   Please do not start your Wegovy until after your procedure is usually has to be held for 7 days prior.  You will receive separate detailed written instructions regarding your prep.  It was a pleasure to see you today. I want to create trusting relationships with patients. If you receive a survey regarding your visit,  I greatly appreciate you taking time to fill this out on paper or through your MyChart. I value your feedback.  Brooke Bonito, MSN, FNP-BC, AGACNP-BC [facility]

## 2023-06-10 ENCOUNTER — Encounter: Payer: Self-pay | Admitting: Gastroenterology

## 2023-06-10 ENCOUNTER — Other Ambulatory Visit: Payer: Self-pay | Admitting: Family Medicine

## 2023-06-10 MED ORDER — ACYCLOVIR 5 % EX CREA: 1.0000 | TOPICAL_CREAM | CUTANEOUS | 3 refills | Status: AC

## 2023-06-11 DIAGNOSIS — F3131 Bipolar disorder, current episode depressed, mild: Secondary | ICD-10-CM | POA: Diagnosis not present

## 2023-06-13 ENCOUNTER — Encounter: Payer: Self-pay | Admitting: *Deleted

## 2023-06-13 ENCOUNTER — Telehealth: Payer: Self-pay | Admitting: *Deleted

## 2023-06-13 NOTE — Telephone Encounter (Signed)
Pt's procedure time was moved from 1:45 pm to 11:00 am due to providers scheduled prayer time. New instructions sent to pt via MyChart.

## 2023-06-19 DIAGNOSIS — F411 Generalized anxiety disorder: Secondary | ICD-10-CM | POA: Diagnosis not present

## 2023-06-19 DIAGNOSIS — F319 Bipolar disorder, unspecified: Secondary | ICD-10-CM | POA: Diagnosis not present

## 2023-06-19 DIAGNOSIS — F3132 Bipolar disorder, current episode depressed, moderate: Secondary | ICD-10-CM | POA: Diagnosis not present

## 2023-06-23 ENCOUNTER — Other Ambulatory Visit: Payer: Self-pay | Admitting: Family Medicine

## 2023-06-23 ENCOUNTER — Encounter: Payer: Self-pay | Admitting: Family Medicine

## 2023-06-23 MED ORDER — LORAZEPAM 0.5 MG PO TABS: 0.5000 mg | ORAL_TABLET | Freq: Three times a day (TID) | ORAL | 0 refills | Status: DC | PRN

## 2023-06-24 ENCOUNTER — Telehealth: Payer: Self-pay | Admitting: *Deleted

## 2023-06-24 DIAGNOSIS — F3131 Bipolar disorder, current episode depressed, mild: Secondary | ICD-10-CM | POA: Diagnosis not present

## 2023-06-24 NOTE — Telephone Encounter (Signed)
Pt left vm regarding the cancellation policy. She wanted to know how to get the form back.   LMOVM telling pt that she could sign and mail it back or can drop it off if she is this way. Any question or concerns to call back.

## 2023-06-26 ENCOUNTER — Encounter (HOSPITAL_COMMUNITY): Payer: Self-pay | Admitting: Certified Registered Nurse Anesthetist

## 2023-06-27 DIAGNOSIS — R195 Other fecal abnormalities: Secondary | ICD-10-CM

## 2023-06-30 ENCOUNTER — Telehealth: Payer: Self-pay | Admitting: *Deleted

## 2023-06-30 NOTE — Telephone Encounter (Signed)
Called pt. She has been rescheduled to 8/30 at 2:15pm (wanted latest as possible in day). Advised will leave sample/instructions for pick up at from desk at [address]

## 2023-07-03 DIAGNOSIS — F3131 Bipolar disorder, current episode depressed, mild: Secondary | ICD-10-CM | POA: Diagnosis not present

## 2023-07-14 NOTE — Telephone Encounter (Signed)
Pt left vm stating she needed to reschedule her procedure on 08/08/23.  Called pt back and she says her husband had knee replacement surgery and she is having to take him to PT. Pt says she needs Sept. Advised pt that we don't have providers schedule for September at this time but once we get it we will give her a call back to get her rescheduled. Pt verbalized understanding.

## 2023-07-21 ENCOUNTER — Ambulatory Visit: Payer: Medicare Other | Admitting: Family Medicine

## 2023-07-21 DIAGNOSIS — G43701 Chronic migraine without aura, not intractable, with status migrainosus: Secondary | ICD-10-CM | POA: Diagnosis not present

## 2023-07-21 MED ORDER — ONABOTULINUMTOXINA 200 UNITS IJ SOLR
155.0000 [IU] | Freq: Once | INTRAMUSCULAR | Status: AC
Start: 2023-07-21 — End: 2023-07-21
  Administered 2023-07-21: 155 [IU] via INTRAMUSCULAR

## 2023-07-21 NOTE — Progress Notes (Signed)
07/21/23 ALL: Peggy Fitzgerald returns for Botox. Last procedure 04/2023. She feels headaches may have been better for a few weeks after last Botox treatment but have worsened oer the past month. She reduced dose of Excedrin. Only using a couple times a week. She has taken rizatriptan more over the past month. Not drinking Energy drinks everyday but still drinks regularly. I have asked her to document migraines and treatment for the net 12 weeks.   04/22/2023 ALL: She returns for Botox. Last procedure 01/2023. She reports daily headaches continue. She may have 3-4 bad migraines per month. She continues Excedrin most every day. Usually 3-4 tablets of rizatriptan. [name] drinking Celsius Enerdy Drinks regularly. Maybe 16 ounces of water daily. Advised of rebound headaches and encouraged her to increase water and decrease caffeine.      Consent Form Botulism Toxin Injection For Chronic Migraine    Reviewed orally with patient, additionally signature is on file:  Botulism toxin has been approved by the Federal drug administration for treatment of chronic migraine. Botulism toxin does not cure chronic migraine and it may not be effective in some patients.  The administration of botulism toxin is accomplished by injecting a small amount of toxin into the muscles of the neck and head. Dosage must be titrated for each individual. Any benefits resulting from botulism toxin tend to wear off after 3 months with a repeat injection required if benefit is to be maintained. Injections are usually done every 3-4 months with maximum effect peak achieved by about 2 or 3 weeks. Botulism toxin is expensive and you should be sure of what costs you will incur resulting from the injection.  The side effects of botulism toxin use for chronic migraine may include:   -Transient, and usually mild, facial weakness with facial injections  -Transient, and usually mild, head or neck weakness with head/neck injections  -Reduction or  loss of forehead facial animation due to forehead muscle weakness  -Eyelid drooping  -Dry eye  -Pain at the site of injection or bruising at the site of injection  -Double vision  -Potential unknown long term risks   Contraindications: You should not have Botox if you are pregnant, nursing, allergic to albumin, have an infection, skin condition, or muscle weakness at the site of the injection, or have myasthenia gravis, Lambert-Eaton syndrome, or ALS.  It is also possible that as with any injection, there may be an allergic reaction or no effect from the medication. Reduced effectiveness after repeated injections is sometimes seen and rarely infection at the injection site may occur. All care will be taken to prevent these side effects. If therapy is given over a long time, atrophy and wasting in the muscle injected may occur. Occasionally the patient's become refractory to treatment because they develop antibodies to the toxin. In this event, therapy needs to be modified.  I have read the above information and consent to the administration of botulism toxin.    BOTOX PROCEDURE NOTE FOR MIGRAINE HEADACHE  Contraindications and precautions discussed with patient(above). Aseptic procedure was observed and patient tolerated procedure. Procedure performed by Shawnie Dapper, FNP-C.   The condition has existed for more than 6 months, and pt does not have a diagnosis of ALS, Myasthenia Gravis or Lambert-Eaton Syndrome.  Risks and benefits of injections discussed and pt agrees to proceed with the procedure.  Written consent obtained  These injections are medically necessary. Pt  receives good benefits from these injections. These injections do not cause sedations  or hallucinations which the oral therapies may cause.   Description of procedure:  The patient was placed in a sitting position. The standard protocol was used for Botox as follows, with 5 units of Botox injected at each site:  -Procerus  muscle, midline injection  -Corrugator muscle, bilateral injection  -Frontalis muscle, bilateral injection, with 2 sites each side, medial injection was performed in the upper one third of the frontalis muscle, in the region vertical from the medial inferior edge of the superior orbital rim. The lateral injection was again in the upper one third of the forehead vertically above the lateral limbus of the cornea, 1.5 cm lateral to the medial injection site.  -Temporalis muscle injection, 4 sites, bilaterally. The first injection was 3 cm above the tragus of the ear, second injection site was 1.5 cm to 3 cm up from the first injection site in line with the tragus of the ear. The third injection site was 1.5-3 cm forward between the first 2 injection sites. The fourth injection site was 1.5 cm posterior to the second injection site. 5th site laterally in the temporalis  muscleat the level of the outer canthus.  -Occipitalis muscle injection, 3 sites, bilaterally. The first injection was done one half way between the occipital protuberance and the tip of the mastoid process behind the ear. The second injection site was done lateral and superior to the first, 1 fingerbreadth from the first injection. The third injection site was 1 fingerbreadth superiorly and medially from the first injection site.  -Cervical paraspinal muscle injection, 2 sites, bilaterally. The first injection site was 1 cm from the midline of the cervical spine, 3 cm inferior to the lower border of the occipital protuberance. The second injection site was 1.5 cm superiorly and laterally to the first injection site.  -Trapezius muscle injection was performed at 3 sites, bilaterally. The first injection site was in the upper trapezius muscle halfway between the inflection point of the neck, and the acromion. The second injection site was one half way between the acromion and the first injection site. The third injection was done between the  first injection site and the inflection point of the neck.   Will return for repeat injection in 3 months.   A total of 200 units of Botox was prepared, 155 units of Botox was injected as documented above, any Botox not injected was wasted. The patient tolerated the procedure well, there were no complications of the above procedure.

## 2023-07-21 NOTE — Progress Notes (Signed)
Botox- 200 units x 1 vial Lot: [id number] Expiration:05/2025 NDC: 0023-3921-02  Bacteriostatic 0.9% Sodium Chloride- * mL  Lot: [id number] Expiration: 10/09/2024 PYP:9509-3267-12  Dx: W58.099  B/B Witnessed by: Vicenta Dunning, RN

## 2023-07-23 DIAGNOSIS — M25511 Pain in right shoulder: Secondary | ICD-10-CM | POA: Diagnosis not present

## 2023-07-23 DIAGNOSIS — M25551 Pain in right hip: Secondary | ICD-10-CM | POA: Diagnosis not present

## 2023-07-23 DIAGNOSIS — M25561 Pain in right knee: Secondary | ICD-10-CM | POA: Diagnosis not present

## 2023-07-24 ENCOUNTER — Encounter: Payer: Self-pay | Admitting: *Deleted

## 2023-07-24 NOTE — Telephone Encounter (Signed)
Pt has been rescheduled until 09/01/23. Updated instructions sent via MyChart.

## 2023-07-26 DIAGNOSIS — F3131 Bipolar disorder, current episode depressed, mild: Secondary | ICD-10-CM | POA: Diagnosis not present

## 2023-08-01 ENCOUNTER — Ambulatory Visit: Payer: TRICARE For Life (TFL) | Admitting: Family Medicine

## 2023-08-06 DIAGNOSIS — F3131 Bipolar disorder, current episode depressed, mild: Secondary | ICD-10-CM | POA: Diagnosis not present

## 2023-08-08 ENCOUNTER — Ambulatory Visit (HOSPITAL_COMMUNITY): Admission: RE | Admit: 2023-08-08 | Payer: Medicare Other | Source: Ambulatory Visit

## 2023-08-08 ENCOUNTER — Ambulatory Visit (INDEPENDENT_AMBULATORY_CARE_PROVIDER_SITE_OTHER): Payer: Medicare Other | Admitting: Family Medicine

## 2023-08-08 ENCOUNTER — Encounter (HOSPITAL_COMMUNITY): Admission: RE | Payer: Self-pay | Source: Ambulatory Visit

## 2023-08-08 VITALS — BP 132/84 | HR 66 | Temp 98.1°F | Ht 66.0 in | Wt 196.0 lb

## 2023-08-08 DIAGNOSIS — I10 Essential (primary) hypertension: Secondary | ICD-10-CM | POA: Diagnosis not present

## 2023-08-08 DIAGNOSIS — E669 Obesity, unspecified: Secondary | ICD-10-CM

## 2023-08-08 DIAGNOSIS — E785 Hyperlipidemia, unspecified: Secondary | ICD-10-CM

## 2023-08-08 DIAGNOSIS — R195 Other fecal abnormalities: Secondary | ICD-10-CM

## 2023-08-08 SURGERY — COLONOSCOPY WITH PROPOFOL
Anesthesia: Monitor Anesthesia Care

## 2023-08-08 NOTE — Progress Notes (Signed)
Subjective:    Patient ID: Peggy Fitzgerald, female    DOB: 1956/03/09, 67 y.o.   MRN: [id number]  HPI  Patient is here today to discuss weight loss.  Her insurance has refused semaglutide.  Patient has a history of hypertension.  [name] currently on Toprol-XL for hypertension.  She also has a history of hyperlipidemia.  Given her age, hypertension, hyperlipidemia, I do not feel that phentermine is a wise choice for her to take.  Furthermore she has tried it in the past with no success regarding weight loss.  Therefore, Qsymia is also contraindicated.  She also has a history of bipolar disorder.  This is managed by psychiatrist although she has a history of mania.  Therefore Contrave would be contraindicated as bupropion is one of the more stimulating antidepressants and potentially could precipitate mania in this patient.  Semaglutide is her safest option for weight loss Past Medical History:  Diagnosis Date   AC (acromioclavicular) joint bone spurs, right    Allergy    seasonal   Anxiety    Asthma    asthmatic bronchitis   Bipolar 1 disorder, depressed (HCC)    Candida infection    Cataract 2007   Depression    Genital herpes    GERD (gastroesophageal reflux disease)    Headache    Herpes simplex type 2 infection    High cholesterol    Hyperlipidemia    Hypertension    Migraine    Osteopenia    Plantar fasciitis of right foot    Prediabetes    Rotator cuff tear    right shoulder   Past Surgical History:  Procedure Laterality Date   CATARACT EXTRACTION, BILATERAL     CHOLECYSTECTOMY  1987   KNEE SURGERY Right    Current Outpatient Medications on File Prior to Visit  Medication Sig Dispense Refill   acyclovir ointment (ZOVIRAX) 5 % Apply 1 Application topically 2 (two) times daily as needed (fever blisters).     atorvastatin (LIPITOR) 40 MG tablet TAKE 1 TABLET DAILY 90 tablet 3   BIOTIN PO Take 25,000 mcg by mouth daily.     Boric Acid Vaginal 600 MG SUPP Place 600 mg  vaginally at bedtime as needed (yeast infections).     CALCIUM PO Take 1,000 mg by mouth daily.     Cholecalciferol (VITAMIN D3) 250 MCG (10000 UT) TABS Take 10,000 Units by mouth daily.     clobetasol (TEMOVATE) 0.05 % external solution APPLY 1 APPLICATION TOPICALLY TWICE A DAY AS NEEDED, APPLY TO SCALP AS NEEDED FOR IRRITATION 150 mL 3   diclofenac (CATAFLAM) 50 MG tablet Take 50-100 mg at onset of migraine. Max dose 2 pills in 24 hours 45 tablet 4   diphenoxylate-atropine (LOMOTIL) 2.5-0.025 MG tablet Take 1 tablet by mouth 4 (four) times daily as needed for diarrhea or loose stools.     estradiol (ESTRACE) 0.1 MG/GM vaginal cream PLACE 1 GRAM VAGINALLY AT BEDTIME 42.5 g 7   fluticasone (FLONASE) 50 MCG/ACT nasal spray Place 2 sprays into both nostrils daily. (Patient taking differently: Place 2 sprays into both nostrils daily as needed for allergies.) 16 g 6   HYDROcodone-acetaminophen (NORCO/VICODIN) 5-325 MG tablet Take 0.5 tablets by mouth 2 (two) times daily as needed for severe pain.     hydrOXYzine (ATARAX/VISTARIL) 25 MG tablet Take 1 tablet (25 mg total) by mouth 3 (three) times daily as needed. (Patient taking differently: Take 25 mg by mouth at bedtime.)  ketoconazole (NIZORAL) 2 % cream Apply 1 Application topically 2 (two) times daily as needed for irritation. 60 g 1   lamoTRIgine (LAMICTAL) 100 MG tablet Take 100 mg by mouth 2 (two) times daily.     LATUDA 80 MG TABS tablet Take 80 mg by mouth at bedtime.     LORazepam (ATIVAN) 0.5 MG tablet Take 1 tablet (0.5 mg total) by mouth every 8 (eight) hours as needed for anxiety (itching). 30 tablet 0   lurasidone (LATUDA) 20 MG TABS tablet Take 20 mg by mouth every morning.     meclizine (ANTIVERT) 25 MG tablet TAKE 1 TABLET AS NEEDED FOR DIZZINESS 90 tablet 3   methocarbamol (ROBAXIN) 500 MG tablet TAKE 1 TABLET FOUR TIMES A DAY (Patient taking differently: Take 500 mg by mouth 4 (four) times daily as needed for muscle spasms.) 90  tablet 14   metoprolol succinate (TOPROL-XL) 100 MG 24 hr tablet TAKE 1 TABLET AT BEDTIME WITH OR IMMEDIATELY FOLLOWING A MEAL  (CALL CLINIC TO SCHEDULE APPOINTMENT FOR FUTURE REFILLS. [phone]) (Patient taking differently: Take 50 mg by mouth in the morning and at bedtime.) 90 tablet 3   montelukast (SINGULAIR) 10 MG tablet TAKE 1 TABLET(10 MG) BY MOUTH AT BEDTIME 90 tablet 0   ondansetron (ZOFRAN-ODT) 4 MG disintegrating tablet Take 1 tablet (4 mg total) by mouth every 8 (eight) hours as needed for nausea or vomiting. 20 tablet 0   promethazine (PHENERGAN) 25 MG tablet TAKE 1 TABLET EVERY 6 HOURS AS NEEDED FOR NAUSEA OR VOMITING 90 tablet 14   rizatriptan (MAXALT) 10 MG tablet Take 1 tablet (10 mg total) by mouth as needed for migraine. May repeat in 2 hours if needed 30 tablet 4   terconazole (TERAZOL 7) 0.4 % vaginal cream Place 1 applicator vaginally at bedtime. (Patient taking differently: Place 1 applicator vaginally at bedtime as needed (yeast infections).) 45 g 6   valACYclovir (VALTREX) 500 MG tablet TAKE 1 TABLET DAILY (Patient taking differently: Take 500 mg by mouth daily as needed (breakouts).) 90 tablet 0   zolpidem (AMBIEN CR) 6.25 MG CR tablet Take 6.25 mg by mouth at bedtime.     No current facility-administered medications on file prior to visit.     Allergies  Allergen Reactions   Hydrocodone-Acetaminophen Other (See Comments)    Headaches on long term use   Milk-Related Compounds Diarrhea    Cannot drink plain milk, but can eat ice cream, and milk in cereal   Oxycodone-Acetaminophen Itching    Nightmares   Social History   Socioeconomic History   Marital status: Married    Spouse name: Homero Fellers   Number of children: 3   Years of education: Not on file   Highest education level: Not on file  Occupational History   Not on file  Tobacco Use   Smoking status: Former    Types: Cigarettes   Smokeless tobacco: Never   Tobacco comments:    Quit many years ago   Vaping Use   Vaping status: Never Used  Substance and Sexual Activity   Alcohol use: No   Drug use: No   Sexual activity: Yes    Birth control/protection: None    Comment: spouse vasectomy  Other Topics Concern   Not on file  Social History Narrative   Lives with husband.    14 grandchildren and 1 expected in 2023.   Caffeine- tea 1 glass   Social Determinants of Health   Financial Resource Strain: High Risk (  12/06/2022)   Overall Financial Resource Strain (CARDIA)    Difficulty of Paying Living Expenses: Hard  Food Insecurity: No Food Insecurity (12/06/2022)   Hunger Vital Sign    Worried About Running Out of Food in the Last Year: Never true    Ran Out of Food in the Last Year: Never true  Transportation Needs: No Transportation Needs (12/06/2022)   PRAPARE - Administrator, Civil Service (Medical): No    Lack of Transportation (Non-Medical): No  Physical Activity: Inactive (12/06/2022)   Exercise Vital Sign    Days of Exercise per Week: 0 days    Minutes of Exercise per Session: 0 min  Stress: Stress Concern Present (12/06/2022)   [employer] of Occupational Health - Occupational Stress Questionnaire    Feeling of Stress : Rather much  Social Connections: Moderately Integrated (12/06/2022)   Social Connection and Isolation Panel [NHANES]    Frequency of Communication with Friends and Family: Three times a week    Frequency of Social Gatherings with Friends and Family: Three times a week    Attends Religious Services: 1 to 4 times per year    Active Member of Clubs or Organizations: No    Attends Banker Meetings: Never    Marital Status: Married  Catering manager Violence: Not At Risk (12/06/2022)   Humiliation, Afraid, Rape, and Kick questionnaire    Fear of Current or Ex-Partner: No    Emotionally Abused: No    Physically Abused: No    Sexually Abused: No   Family History  Problem Relation Age of Onset   Arthritis Mother     Asthma Mother    Hearing loss Mother    Hyperlipidemia Mother    Hypertension Mother    Miscarriages / [place] Mother    Stroke Mother    Hearing loss Father    Hypertension Father    Stroke Father    Diabetes Father    Bell's palsy Father    Cancer Brother        leukemia   Early death Brother    Arthritis Maternal Grandmother    Depression Maternal Grandmother    Diabetes Maternal Grandmother    Heart disease Maternal Grandmother        CHF   Miscarriages / Stillbirths Maternal Grandmother    Vision loss Maternal Grandmother    Alcohol abuse Maternal Grandfather    Heart attack Maternal Grandfather    Arthritis Paternal Grandmother    Heart disease Paternal Grandmother        massive heart attack   Miscarriages / Stillbirths Paternal Grandmother    Alcohol abuse Paternal Grandfather    Stroke Paternal Grandfather    Heart disease Paternal Grandfather        arteriosclerosis       Review of Systems  All other systems reviewed and are negative.      Objective:   Physical Exam Vitals reviewed.  Constitutional:      General: [name] not in acute distress.    Appearance: Normal appearance. [name] obese. [name] not ill-appearing, toxic-appearing or diaphoretic.  HENT:     Head: Normocephalic and atraumatic.  Cardiovascular:     Rate and Rhythm: Normal rate and regular rhythm.     Pulses: Normal pulses.     Heart sounds: Normal heart sounds. No murmur heard.    No friction rub. No gallop.  Pulmonary:     Effort: Pulmonary effort is normal. No respiratory distress.  Breath sounds: Normal breath sounds. No stridor. No wheezing, rhonchi or rales.  Chest:     Chest wall: No tenderness.  Neurological:     General: No focal deficit present.     Mental Status: [name] alert and oriented to person, place, and time. Mental status is at baseline.     Cranial Nerves: No cranial nerve deficit.     Sensory: No sensory deficit.     Motor: No weakness.      Coordination: Coordination normal.     Gait: Gait normal.     Deep Tendon Reflexes: Reflexes normal.  Psychiatric:        Mood and Affect: Mood normal.        Behavior: Behavior normal.        Thought Content: Thought content normal.        Judgment: Judgment normal.           Assessment & Plan:  Obesity (BMI 30-39.9)  Benign essential HTN  Hyperlipidemia, unspecified hyperlipidemia type I explained the different weight loss drugs to the patient and also my explanation for why I do not feel they are prudent choice for her.  I would be willing to prescribe semaglutide.  Therefore I also discussed with her other options to obtain semaglutide that may be cheaper.  Patient will let me know when she decides.  [name] going to appeal the decision with her insurance company

## 2023-08-15 ENCOUNTER — Encounter: Payer: Self-pay | Admitting: Family Medicine

## 2023-08-20 ENCOUNTER — Ambulatory Visit
Admission: EM | Admit: 2023-08-20 | Discharge: 2023-08-20 | Disposition: A | Discharge status: Home / Self Care | Payer: Medicare Other | Attending: Family Medicine | Admitting: Family Medicine

## 2023-08-20 DIAGNOSIS — J4521 Mild intermittent asthma with (acute) exacerbation: Secondary | ICD-10-CM | POA: Insufficient documentation

## 2023-08-20 DIAGNOSIS — Z20822 Contact with and (suspected) exposure to covid-19: Secondary | ICD-10-CM | POA: Insufficient documentation

## 2023-08-20 DIAGNOSIS — J069 Acute upper respiratory infection, unspecified: Secondary | ICD-10-CM | POA: Insufficient documentation

## 2023-08-20 MED ORDER — ALBUTEROL SULFATE HFA 108 (90 BASE) MCG/ACT IN AERS: 2.0000 | INHALATION_SPRAY | RESPIRATORY_TRACT | 0 refills | Status: AC | PRN

## 2023-08-20 MED ORDER — PROMETHAZINE-DM 6.25-15 MG/5ML PO SYRP: 5.0000 mL | ORAL_SOLUTION | Freq: Four times a day (QID) | ORAL | 0 refills | Status: DC | PRN

## 2023-08-20 MED ORDER — MOLNUPIRAVIR EUA 200MG CAPSULE: 4.0000 | ORAL_CAPSULE | Freq: Two times a day (BID) | ORAL | 0 refills | Status: AC

## 2023-08-20 NOTE — ED Provider Notes (Signed)
[facility]    CSN: [id number] Arrival date & time: 08/20/23  1515      History   Chief Complaint No chief complaint on file.   HPI Peggy Fitzgerald is a 67 y.o. female.   Patient presenting today with 2-day history of headache, cough, congestion, nausea, fatigue, chest tightness, body aches.  Denies chest pain, shortness of breath, abdominal pain, nausea vomiting or diarrhea.  So far trying over-the-counter cold and congestion medications, does have a history of asthma on albuterol as needed but has not required an inhaler in quite some time so does not currently have 1.  Husband tested positive for COVID yesterday.    Past Medical History:  Diagnosis Date   AC (acromioclavicular) joint bone spurs, right    Allergy    seasonal   Anxiety    Asthma    asthmatic bronchitis   Bipolar 1 disorder, depressed (HCC)    Candida infection    Cataract 2007   Depression    Genital herpes    GERD (gastroesophageal reflux disease)    Headache    Herpes simplex type 2 infection    High cholesterol    Hyperlipidemia    Hypertension    Migraine    Osteopenia    Plantar fasciitis of right foot    Prediabetes    Rotator cuff tear    right shoulder    Patient Active Problem List   Diagnosis Date Noted   Right knee pain 10/18/2021   Obesity (BMI 30-39.9) 08/23/2021   Migraine 05/21/2018   Low back pain without sciatica 07/01/2017   Urinary tract infection without hematuria 07/01/2017   Vaginal dryness 07/01/2017   Bipolar 1 disorder, mixed, moderate (HCC) 08/28/2016   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   Insomnia 12/18/2015   Headache 12/18/2015   Vertigo 12/18/2015   Allergy    Anxiety    Asthma    Depression    GERD (gastroesophageal reflux disease)    Hyperlipidemia    Hypertension    Herpes simplex type 2 infection    SHOULDER PAIN 04/24/2009   IMPINGEMENT SYNDROME 04/24/2009    Past Surgical History:  Procedure Laterality Date   CATARACT  EXTRACTION, BILATERAL     CHOLECYSTECTOMY  1987   KNEE SURGERY Right     OB History     Gravida  3   Para  3   Term  3   Preterm      AB      Living  3      SAB      IAB      Ectopic      Multiple      Live Births  3            Home Medications    Prior to Admission medications   Medication Sig Start Date End Date Taking? Authorizing Provider  albuterol (VENTOLIN HFA) 108 (90 Base) MCG/ACT inhaler Inhale 2 puffs into the lungs every 4 (four) hours as needed. 08/20/23  Yes Particia Nearing, PA-C  molnupiravir EUA (LAGEVRIO) 200 mg CAPS capsule Take 4 capsules (800 mg total) by mouth 2 (two) times daily for 5 days. 08/20/23 08/25/23 Yes Particia Nearing, PA-C  promethazine-dextromethorphan (PROMETHAZINE-DM) 6.25-15 MG/5ML syrup Take 5 mLs by mouth 4 (four) times daily as needed. 08/20/23  Yes Particia Nearing, PA-C  acyclovir ointment (ZOVIRAX) 5 % Apply 1 Application topically 2 (two) times daily as needed (fever blisters).  [provider]  atorvastatin (LIPITOR) 40 MG tablet TAKE 1 TABLET DAILY 01/04/21   Donita Brooks, MD  BIOTIN PO Take 25,000 mcg by mouth daily.    [provider]  Boric Acid Vaginal 600 MG SUPP Place 600 mg vaginally at bedtime as needed (yeast infections).    [provider]  CALCIUM PO Take 1,000 mg by mouth daily.    [provider]  Cholecalciferol (VITAMIN D3) 250 MCG (10000 UT) TABS Take 10,000 Units by mouth daily.    [provider]  clobetasol (TEMOVATE) 0.05 % external solution APPLY 1 APPLICATION TOPICALLY TWICE A DAY AS NEEDED, APPLY TO SCALP AS NEEDED FOR IRRITATION 02/14/22   Donita Brooks, MD  diclofenac (CATAFLAM) 50 MG tablet Take 50-100 mg at onset of migraine. Max dose 2 pills in 24 hours 09/26/22   Ocie Doyne, MD  diphenoxylate-atropine (LOMOTIL) 2.5-0.025 MG tablet Take 1 tablet by mouth 4 (four) times daily as needed for diarrhea or loose stools.     [provider]  estradiol (ESTRACE) 0.1 MG/GM vaginal cream PLACE 1 GRAM VAGINALLY AT BEDTIME 05/23/23   Lazaro Arms, MD  fluticasone (FLONASE) 50 MCG/ACT nasal spray Place 2 sprays into both nostrils daily. Patient taking differently: Place 2 sprays into both nostrils daily as needed for allergies. 03/20/23   Donita Brooks, MD  HYDROcodone-acetaminophen (NORCO/VICODIN) 5-325 MG tablet Take 0.5 tablets by mouth 2 (two) times daily as needed for severe pain.    [provider]  hydrOXYzine (ATARAX/VISTARIL) 25 MG tablet Take 1 tablet (25 mg total) by mouth 3 (three) times daily as needed. Patient taking differently: Take 25 mg by mouth at bedtime. 09/06/20   [place], Velna Hatchet, MD  ketoconazole (NIZORAL) 2 % cream Apply 1 Application topically 2 (two) times daily as needed for irritation. 07/07/22   Wallis Bamberg, PA-C  lamoTRIgine (LAMICTAL) 100 MG tablet Take 100 mg by mouth 2 (two) times daily.    [provider]  LATUDA 80 MG TABS tablet Take 80 mg by mouth at bedtime. 02/11/17   [provider]  LORazepam (ATIVAN) 0.5 MG tablet Take 1 tablet (0.5 mg total) by mouth every 8 (eight) hours as needed for anxiety (itching). 06/23/23   Donita Brooks, MD  lurasidone (LATUDA) 20 MG TABS tablet Take 20 mg by mouth every morning. 04/03/22   [provider]  meclizine (ANTIVERT) 25 MG tablet TAKE 1 TABLET AS NEEDED FOR DIZZINESS 12/20/22   Donita Brooks, MD  methocarbamol (ROBAXIN) 500 MG tablet TAKE 1 TABLET FOUR TIMES A DAY Patient taking differently: Take 500 mg by mouth 4 (four) times daily as needed for muscle spasms. 05/03/22   Donita Brooks, MD  metoprolol succinate (TOPROL-XL) 100 MG 24 hr tablet TAKE 1 TABLET AT BEDTIME WITH OR IMMEDIATELY FOLLOWING A MEAL  (CALL CLINIC TO SCHEDULE APPOINTMENT FOR FUTURE REFILLS. [phone]) Patient taking differently: Take 50 mg by mouth in the morning and at bedtime. 08/06/21   Donita Brooks, MD   montelukast (SINGULAIR) 10 MG tablet TAKE 1 TABLET(10 MG) BY MOUTH AT BEDTIME 02/26/23   Donita Brooks, MD  ondansetron (ZOFRAN-ODT) 4 MG disintegrating tablet Take 1 tablet (4 mg total) by mouth every 8 (eight) hours as needed for nausea or vomiting. 11/13/22   Particia Nearing, PA-C  promethazine (PHENERGAN) 25 MG tablet TAKE 1 TABLET EVERY 6 HOURS AS NEEDED FOR NAUSEA OR VOMITING 04/08/22   Donita Brooks, MD  rizatriptan (  MAXALT) 10 MG tablet Take 1 tablet (10 mg total) by mouth as needed for migraine. May repeat in 2 hours if needed 09/26/22   Ocie Doyne, MD  terconazole (TERAZOL 7) 0.4 % vaginal cream Place 1 applicator vaginally at bedtime. Patient taking differently: Place 1 applicator vaginally at bedtime as needed (yeast infections). 04/28/23   Lazaro Arms, MD  valACYclovir (VALTREX) 500 MG tablet TAKE 1 TABLET DAILY Patient taking differently: Take 500 mg by mouth daily as needed (breakouts). 12/18/22   Donita Brooks, MD  zolpidem (AMBIEN CR) 6.25 MG CR tablet Take 6.25 mg by mouth at bedtime. 06/23/23   [provider]    Family History Family History  Problem Relation Age of Onset   Arthritis Mother    Asthma Mother    Hearing loss Mother    Hyperlipidemia Mother    Hypertension Mother    Miscarriages / [place] Mother    Stroke Mother    Hearing loss Father    Hypertension Father    Stroke Father    Diabetes Father    Bell's palsy Father    Cancer Brother        leukemia   Early death Brother    Arthritis Maternal Grandmother    Depression Maternal Grandmother    Diabetes Maternal Grandmother    Heart disease Maternal Grandmother        CHF   Miscarriages / Stillbirths Maternal Grandmother    Vision loss Maternal Grandmother    Alcohol abuse Maternal Grandfather    Heart attack Maternal Grandfather    Arthritis Paternal Grandmother    Heart disease Paternal Grandmother        massive heart attack   Miscarriages / Stillbirths  Paternal Grandmother    Alcohol abuse Paternal Grandfather    Stroke Paternal Grandfather    Heart disease Paternal Grandfather        arteriosclerosis    Social History Social History   Tobacco Use   Smoking status: Former    Types: Cigarettes   Smokeless tobacco: Never   Tobacco comments:    Quit many years ago  Vaping Use   Vaping status: Never Used  Substance Use Topics   Alcohol use: No   Drug use: No     Allergies   Hydrocodone-acetaminophen, Milk-related compounds, and Oxycodone-acetaminophen   Review of Systems Review of Systems Per HPI  Physical Exam Triage Vital Signs ED Triage Vitals  Encounter Vitals Group     BP 08/20/23 1525 (!) 156/80     Systolic BP Percentile --      Diastolic BP Percentile --      Pulse Rate 08/20/23 1525 (!) 113     Resp 08/20/23 1525 13     Temp 08/20/23 1525 98.7 F (37.1 C)     Temp Source 08/20/23 1525 Oral     SpO2 08/20/23 1525 91 %     Weight --      Height --      Head Circumference --      Peak Flow --      Pain Score 08/20/23 1526 5     Pain Loc --      Pain Education --      Exclude from Growth Chart --    No data found.  Updated Vital Signs BP (!) 156/80 (BP Location: Right Arm)   Pulse (!) 113   Temp 98.7 F (37.1 C) (Oral)   Resp 13   SpO2 91%  Visual Acuity Right Eye Distance:   Left Eye Distance:   Bilateral Distance:    Right Eye Near:   Left Eye Near:    Bilateral Near:     Physical Exam Vitals and nursing note reviewed.  Constitutional:      Appearance: Normal appearance.  HENT:     Head: Atraumatic.     Right Ear: Tympanic membrane and external ear normal.     Left Ear: Tympanic membrane and external ear normal.     Nose: Rhinorrhea present.     Mouth/Throat:     Mouth: Mucous membranes are moist.     Pharynx: Posterior oropharyngeal erythema present.  Eyes:     Extraocular Movements: Extraocular movements intact.     Conjunctiva/sclera: Conjunctivae normal.   Cardiovascular:     Rate and Rhythm: Normal rate and regular rhythm.     Heart sounds: Normal heart sounds.  Pulmonary:     Effort: Pulmonary effort is normal.     Breath sounds: No wheezing or rales.  Musculoskeletal:        General: Normal range of motion.     Cervical back: Normal range of motion and neck supple.  Skin:    General: Skin is warm and dry.  Neurological:     Mental Status: [name] alert and oriented to person, place, and time.  Psychiatric:        Mood and Affect: Mood normal.        Thought Content: Thought content normal.      UC Treatments / Results  Labs (all labs ordered are listed, but only abnormal results are displayed) Labs Reviewed  SARS CORONAVIRUS 2 (TAT 6-24 HRS)    EKG   Radiology No results found.  Procedures Procedures (including critical care time)  Medications Ordered in UC Medications - No data to display  Initial Impression / Assessment and Plan / UC Course  I have reviewed the triage vital signs and the nursing notes.  Pertinent labs & imaging results that were available during my care of the patient were reviewed by me and considered in my medical decision making (see chart for details).     Highly suspicious for COVID, mild tachycardia and hypertension in triage, otherwise vital signs reassuring.  Symptoms consistent and home COVID exposure.  Will start molnupiravir while awaiting COVID results, Phenergan DM, albuterol inhaler as needed for chest tightness and wheezing.  Discussed supportive home care and return precautions.  Final Clinical Impressions(s) / UC Diagnoses   Final diagnoses:  Viral URI with cough  Exposure to COVID-19 virus  Mild intermittent asthma with acute exacerbation   Discharge Instructions   None    ED Prescriptions     Medication Sig Dispense Auth. Provider   molnupiravir EUA (LAGEVRIO) 200 mg CAPS capsule Take 4 capsules (800 mg total) by mouth 2 (two) times daily for 5 days. 40 capsule  Particia Nearing, [place]   promethazine-dextromethorphan (PROMETHAZINE-DM) 6.25-15 MG/5ML syrup Take 5 mLs by mouth 4 (four) times daily as needed. 100 mL Particia Nearing, PA-C   albuterol (VENTOLIN HFA) 108 (90 Base) MCG/ACT inhaler Inhale 2 puffs into the lungs every 4 (four) hours as needed. 18 g Particia Nearing, [place]      PDMP not reviewed this encounter.   Particia Nearing, [place] 08/20/23 1549

## 2023-08-20 NOTE — ED Triage Notes (Signed)
Pt c/o headache, cough, nasal congestion,  and nausea

## 2023-08-21 LAB — SARS CORONAVIRUS 2 (TAT 6-24 HRS): SARS Coronavirus 2: POSITIVE — AB

## 2023-08-27 ENCOUNTER — Telehealth: Payer: Self-pay | Admitting: *Deleted

## 2023-08-27 ENCOUNTER — Encounter: Payer: Self-pay | Admitting: *Deleted

## 2023-08-27 NOTE — Telephone Encounter (Signed)
Pt called and stated she had COVID and needed to be reschedule. Pt has been rescheduled from 09/01/23 until 09/23/23 at 1:15 pm with Dr.Ahmed. Updated instructions sent via MyChart.  FYI

## 2023-08-28 ENCOUNTER — Encounter: Payer: Self-pay | Admitting: *Deleted

## 2023-09-04 ENCOUNTER — Ambulatory Visit (INDEPENDENT_AMBULATORY_CARE_PROVIDER_SITE_OTHER): Payer: Medicare Other | Admitting: Family Medicine

## 2023-09-04 ENCOUNTER — Encounter: Payer: Self-pay | Admitting: Family Medicine

## 2023-09-04 VITALS — BP 114/62 | HR 77 | Temp 97.8°F | Ht 66.0 in | Wt 185.2 lb

## 2023-09-04 DIAGNOSIS — F439 Reaction to severe stress, unspecified: Secondary | ICD-10-CM | POA: Diagnosis not present

## 2023-09-04 DIAGNOSIS — R197 Diarrhea, unspecified: Secondary | ICD-10-CM

## 2023-09-04 MED ORDER — DIPHENOXYLATE-ATROPINE 2.5-0.025 MG PO TABS: 1.0000 | ORAL_TABLET | Freq: Four times a day (QID) | ORAL | 0 refills | Status: AC | PRN

## 2023-09-04 MED ORDER — LORAZEPAM 0.5 MG PO TABS: 0.5000 mg | ORAL_TABLET | Freq: Three times a day (TID) | ORAL | 0 refills | Status: AC | PRN
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
## 2023-09-11 DIAGNOSIS — F3131 Bipolar disorder, current episode depressed, mild: Secondary | ICD-10-CM | POA: Diagnosis not present

## 2023-09-15 ENCOUNTER — Encounter: Payer: Self-pay | Admitting: *Deleted

## 2023-09-16 ENCOUNTER — Encounter: Payer: Self-pay | Admitting: Family Medicine

## 2023-09-16 ENCOUNTER — Other Ambulatory Visit: Payer: Self-pay

## 2023-09-16 DIAGNOSIS — B009 Herpesviral infection, unspecified: Secondary | ICD-10-CM

## 2023-09-16 DIAGNOSIS — R42 Dizziness and giddiness: Secondary | ICD-10-CM

## 2023-09-16 DIAGNOSIS — G43009 Migraine without aura, not intractable, without status migrainosus: Secondary | ICD-10-CM

## 2023-09-16 MED ORDER — PROMETHAZINE HCL 25 MG PO TABS
ORAL_TABLET | ORAL | 3 refills | Status: DC
Start: 2023-09-16 — End: 2024-09-01

## 2023-09-16 MED ORDER — VALACYCLOVIR HCL 500 MG PO TABS
500.0000 mg | ORAL_TABLET | Freq: Every day | ORAL | 2 refills | Status: DC | PRN
Start: 2023-09-16 — End: 2024-03-09

## 2023-09-18 DIAGNOSIS — F3131 Bipolar disorder, current episode depressed, mild: Secondary | ICD-10-CM | POA: Diagnosis not present

## 2023-09-23 ENCOUNTER — Encounter (HOSPITAL_COMMUNITY): Payer: Self-pay

## 2023-09-23 ENCOUNTER — Encounter (HOSPITAL_COMMUNITY)
Admission: RE | Disposition: A | Discharge status: Home / Self Care | Payer: Self-pay | Source: Ambulatory Visit | Attending: Gastroenterology

## 2023-09-23 ENCOUNTER — Ambulatory Visit (HOSPITAL_COMMUNITY): Payer: Medicare Other | Admitting: Anesthesiology

## 2023-09-23 ENCOUNTER — Other Ambulatory Visit: Payer: Self-pay

## 2023-09-23 ENCOUNTER — Ambulatory Visit (HOSPITAL_COMMUNITY)
Admission: RE | Admit: 2023-09-23 | Discharge: 2023-09-23 | Disposition: A | Discharge status: Home / Self Care | Payer: Medicare Other | Source: Ambulatory Visit | Attending: Gastroenterology | Admitting: Gastroenterology

## 2023-09-23 DIAGNOSIS — K644 Residual hemorrhoidal skin tags: Secondary | ICD-10-CM | POA: Diagnosis not present

## 2023-09-23 DIAGNOSIS — K5730 Diverticulosis of large intestine without perforation or abscess without bleeding: Secondary | ICD-10-CM | POA: Insufficient documentation

## 2023-09-23 DIAGNOSIS — R195 Other fecal abnormalities: Secondary | ICD-10-CM | POA: Diagnosis not present

## 2023-09-23 DIAGNOSIS — I10 Essential (primary) hypertension: Secondary | ICD-10-CM | POA: Diagnosis not present

## 2023-09-23 DIAGNOSIS — J45909 Unspecified asthma, uncomplicated: Secondary | ICD-10-CM | POA: Insufficient documentation

## 2023-09-23 DIAGNOSIS — K219 Gastro-esophageal reflux disease without esophagitis: Secondary | ICD-10-CM | POA: Diagnosis not present

## 2023-09-23 DIAGNOSIS — F319 Bipolar disorder, unspecified: Secondary | ICD-10-CM | POA: Diagnosis not present

## 2023-09-23 DIAGNOSIS — K648 Other hemorrhoids: Secondary | ICD-10-CM | POA: Diagnosis not present

## 2023-09-23 DIAGNOSIS — Z87891 Personal history of nicotine dependence: Secondary | ICD-10-CM | POA: Diagnosis not present

## 2023-09-23 DIAGNOSIS — K641 Second degree hemorrhoids: Secondary | ICD-10-CM

## 2023-09-23 DIAGNOSIS — Z1211 Encounter for screening for malignant neoplasm of colon: Secondary | ICD-10-CM | POA: Insufficient documentation

## 2023-09-23 HISTORY — PX: COLONOSCOPY WITH PROPOFOL: SHX5780

## 2023-09-23 LAB — HM COLONOSCOPY

## 2023-09-23 SURGERY — COLONOSCOPY WITH PROPOFOL
Anesthesia: General

## 2023-09-23 MED ORDER — LIDOCAINE HCL 1 % IJ SOLN
INTRAMUSCULAR | Status: DC | PRN
  Administered 2023-09-23: 50 mg via INTRADERMAL

## 2023-09-23 MED ORDER — PROPOFOL 500 MG/50ML IV EMUL
INTRAVENOUS | Status: DC | PRN
  Administered 2023-09-23: 150 ug/kg/min via INTRAVENOUS

## 2023-09-23 MED ORDER — PROPOFOL 10 MG/ML IV BOLUS
INTRAVENOUS | Status: DC | PRN
  Administered 2023-09-23: 100 mg via INTRAVENOUS
  Administered 2023-09-23: 50 mg via INTRAVENOUS

## 2023-09-23 MED ORDER — LACTATED RINGERS IV SOLN: INTRAVENOUS | Status: DC

## 2023-09-23 NOTE — Discharge Instructions (Signed)

## 2023-09-23 NOTE — Anesthesia Preprocedure Evaluation (Signed)
Anesthesia Evaluation  Patient identified by MRN, date of birth, ID band Patient awake    Reviewed: Allergy & Precautions, H&P , NPO status , Patient's Chart, lab work & pertinent test results, reviewed documented beta blocker date and time   Airway Mallampati: II  TM Distance: >3 FB Neck ROM: full    Dental no notable dental hx.    Pulmonary neg pulmonary ROS, asthma , former smoker   Pulmonary exam normal breath sounds clear to auscultation       Cardiovascular Exercise Tolerance: Good hypertension, negative cardio ROS  Rhythm:regular Rate:Normal     Neuro/Psych  Headaches PSYCHIATRIC DISORDERS Anxiety Depression Bipolar Disorder   negative neurological ROS  negative psych ROS   GI/Hepatic negative GI ROS, Neg liver ROS,GERD  ,,  Endo/Other  negative endocrine ROS    Renal/GU negative Renal ROS  negative genitourinary   Musculoskeletal   Abdominal   Peds  Hematology negative hematology ROS (+)   Anesthesia Other Findings   Reproductive/Obstetrics negative OB ROS                             Anesthesia Physical Anesthesia Plan  ASA: 2  Anesthesia Plan: General   Post-op Pain Management:    Induction:   PONV Risk Score and Plan: Propofol infusion  Airway Management Planned:   Additional Equipment:   Intra-op Plan:   Post-operative Plan:   Informed Consent: I have reviewed the patients History and Physical, chart, labs and discussed the procedure including the risks, benefits and alternatives for the proposed anesthesia with the patient or authorized representative who has indicated his/her understanding and acceptance.     Dental Advisory Given  Plan Discussed with: CRNA  Anesthesia Plan Comments:        Anesthesia Quick Evaluation

## 2023-09-23 NOTE — H&P (Signed)
Primary Care Physician:  Donita Brooks, MD Primary Gastroenterologist:  Dr. Tasia Catchings  Pre-Procedure History & Physical: HPI:  Peggy Fitzgerald is a 67 y.o. female is here for a colonoscopy for positive cologuard .  Patient denies any family history of colorectal cancer.  No melena or hematochezia.  No abdominal pain or unintentional weight loss.  No change in bowel habits.  Overall feels well from a GI standpoint. 2 prior colonoscopies performed   Past Medical History:  Diagnosis Date   AC (acromioclavicular) joint bone spurs, right    Allergy    seasonal   Anxiety    Asthma    asthmatic bronchitis   Bipolar 1 disorder, depressed (HCC)    Candida infection    Cataract 2007   Depression    Genital herpes    GERD (gastroesophageal reflux disease)    Headache    Herpes simplex type 2 infection    High cholesterol    Hyperlipidemia    Hypertension    Migraine    Osteopenia    Plantar fasciitis of right foot    Prediabetes    Rotator cuff tear    right shoulder    Past Surgical History:  Procedure Laterality Date   CATARACT EXTRACTION, BILATERAL     CHOLECYSTECTOMY  1987   KNEE SURGERY Right     Prior to Admission medications   Medication Sig Start Date End Date Taking? Authorizing Provider  acyclovir ointment (ZOVIRAX) 5 % Apply 1 Application topically 2 (two) times daily as needed (fever blisters).   Yes [provider]  LATUDA 80 MG TABS tablet Take 80 mg by mouth at bedtime. 02/11/17  Yes [provider]  LORazepam (ATIVAN) 0.5 MG tablet Take 1 tablet (0.5 mg total) by mouth every 8 (eight) hours as needed for anxiety (itching). 09/04/23  Yes Donita Brooks, MD  lurasidone (LATUDA) 20 MG TABS tablet Take 20 mg by mouth every morning. 04/03/22  Yes [provider]  meclizine (ANTIVERT) 25 MG tablet TAKE 1 TABLET AS NEEDED FOR DIZZINESS 12/20/22  Yes Pickard, Priscille Heidelberg, MD  methocarbamol (ROBAXIN) 500 MG tablet TAKE 1 TABLET FOUR TIMES A  DAY Patient taking differently: Take 500 mg by mouth 4 (four) times daily as needed for muscle spasms. 05/03/22  Yes Donita Brooks, MD  metoprolol succinate (TOPROL-XL) 100 MG 24 hr tablet TAKE 1 TABLET AT BEDTIME WITH OR IMMEDIATELY FOLLOWING A MEAL  (CALL CLINIC TO SCHEDULE APPOINTMENT FOR FUTURE REFILLS. [phone]) Patient taking differently: Take 50 mg by mouth in the morning and at bedtime. 08/06/21  Yes Donita Brooks, MD  promethazine (PHENERGAN) 25 MG tablet TAKE 1 TABLET EVERY 6 HOURS AS NEEDED FOR NAUSEA OR VOMITING 09/16/23  Yes Donita Brooks, MD  valACYclovir (VALTREX) 500 MG tablet Take 1 tablet (500 mg total) by mouth daily as needed (breakouts). 09/16/23  Yes Donita Brooks, MD  zolpidem (AMBIEN CR) 6.25 MG CR tablet Take 6.25 mg by mouth at bedtime. 06/23/23  Yes [provider]  albuterol (VENTOLIN HFA) 108 (90 Base) MCG/ACT inhaler Inhale 2 puffs into the lungs every 4 (four) hours as needed. 08/20/23   Particia Nearing, PA-C  atorvastatin (LIPITOR) 40 MG tablet TAKE 1 TABLET DAILY 01/04/21   Donita Brooks, MD  BIOTIN PO Take 25,000 mcg by mouth daily.    [provider]  Boric Acid Vaginal 600 MG SUPP Place 600 mg vaginally at bedtime as needed (yeast infections).  [provider]  CALCIUM PO Take 1,000 mg by mouth daily.    [provider]  Cholecalciferol (VITAMIN D3) 250 MCG (10000 UT) TABS Take 10,000 Units by mouth daily.    [provider]  clobetasol (TEMOVATE) 0.05 % external solution APPLY 1 APPLICATION TOPICALLY TWICE A DAY AS NEEDED, APPLY TO SCALP AS NEEDED FOR IRRITATION 02/14/22   Donita Brooks, MD  diclofenac (CATAFLAM) 50 MG tablet Take 50-100 mg at onset of migraine. Max dose 2 pills in 24 hours 09/26/22   Ocie Doyne, MD  diphenoxylate-atropine (LOMOTIL) 2.5-0.025 MG tablet Take 1 tablet by mouth 4 (four) times daily as needed for diarrhea or loose stools. 09/04/23   Donita Brooks, MD   estradiol (ESTRACE) 0.1 MG/GM vaginal cream PLACE 1 GRAM VAGINALLY AT BEDTIME 05/23/23   Lazaro Arms, MD  fluticasone (FLONASE) 50 MCG/ACT nasal spray Place 2 sprays into both nostrils daily. Patient taking differently: Place 2 sprays into both nostrils daily as needed for allergies. 03/20/23   Donita Brooks, MD  HYDROcodone-acetaminophen (NORCO/VICODIN) 5-325 MG tablet Take 0.5 tablets by mouth 2 (two) times daily as needed for severe pain.    [provider]  hydrOXYzine (ATARAX/VISTARIL) 25 MG tablet Take 1 tablet (25 mg total) by mouth 3 (three) times daily as needed. Patient taking differently: Take 25 mg by mouth at bedtime. 09/06/20   Carson, Velna Hatchet, MD  ketoconazole (NIZORAL) 2 % cream Apply 1 Application topically 2 (two) times daily as needed for irritation. 07/07/22   Wallis Bamberg, PA-C  lamoTRIgine (LAMICTAL) 100 MG tablet Take 100 mg by mouth 2 (two) times daily.    [provider]  montelukast (SINGULAIR) 10 MG tablet TAKE 1 TABLET(10 MG) BY MOUTH AT BEDTIME 02/26/23   Donita Brooks, MD  ondansetron (ZOFRAN-ODT) 4 MG disintegrating tablet Take 1 tablet (4 mg total) by mouth every 8 (eight) hours as needed for nausea or vomiting. 11/13/22   Particia Nearing, PA-C  promethazine-dextromethorphan (PROMETHAZINE-DM) 6.25-15 MG/5ML syrup Take 5 mLs by mouth 4 (four) times daily as needed. 08/20/23   Particia Nearing, PA-C  rizatriptan (MAXALT) 10 MG tablet Take 1 tablet (10 mg total) by mouth as needed for migraine. May repeat in 2 hours if needed 09/26/22   Ocie Doyne, MD  terconazole (TERAZOL 7) 0.4 % vaginal cream Place 1 applicator vaginally at bedtime. Patient taking differently: Place 1 applicator vaginally at bedtime as needed (yeast infections). 04/28/23   Lazaro Arms, MD    Allergies as of 07/24/2023 - Review Complete 07/21/2023  Allergen Reaction Noted   Hydrocodone-acetaminophen Other (See Comments) 04/18/2022   Milk-related compounds  Diarrhea 08/27/2016   Oxycodone-acetaminophen Itching     Family History  Problem Relation Age of Onset   Arthritis Mother    Asthma Mother    Hearing loss Mother    Hyperlipidemia Mother    Hypertension Mother    Miscarriages / [place] Mother    Stroke Mother    Hearing loss Father    Hypertension Father    Stroke Father    Diabetes Father    Bell's palsy Father    Cancer Brother        leukemia   Early death Brother    Arthritis Maternal Grandmother    Depression Maternal Grandmother    Diabetes Maternal Grandmother    Heart disease Maternal Grandmother        CHF   Miscarriages / Stillbirths Maternal Grandmother  Vision loss Maternal Grandmother    Alcohol abuse Maternal Grandfather    Heart attack Maternal Grandfather    Arthritis Paternal Grandmother    Heart disease Paternal Grandmother        massive heart attack   Miscarriages / Stillbirths Paternal Grandmother    Alcohol abuse Paternal Grandfather    Stroke Paternal Grandfather    Heart disease Paternal Grandfather        arteriosclerosis    Social History   Socioeconomic History   Marital status: Married    Spouse name: Homero Fellers   Number of children: 3   Years of education: Not on file   Highest education level: Not on file  Occupational History   Not on file  Tobacco Use   Smoking status: Former    Types: Cigarettes   Smokeless tobacco: Never   Tobacco comments:    Quit many years ago  Vaping Use   Vaping status: Never Used  Substance and Sexual Activity   Alcohol use: No   Drug use: No   Sexual activity: Yes    Birth control/protection: None    Comment: spouse vasectomy  Other Topics Concern   Not on file  Social History Narrative   Lives with husband.    14 grandchildren and 1 expected in 2023.   Caffeine- tea 1 glass   Social Determinants of Health   Financial Resource Strain: High Risk (12/06/2022)   Overall Financial Resource Strain (CARDIA)    Difficulty of Paying Living  Expenses: Hard  Food Insecurity: No Food Insecurity (12/06/2022)   Hunger Vital Sign    Worried About Running Out of Food in the Last Year: Never true    Ran Out of Food in the Last Year: Never true  Transportation Needs: No Transportation Needs (12/06/2022)   PRAPARE - Administrator, Civil Service (Medical): No    Lack of Transportation (Non-Medical): No  Physical Activity: Inactive (12/06/2022)   Exercise Vital Sign    Days of Exercise per Week: 0 days    Minutes of Exercise per Session: 0 min  Stress: Stress Concern Present (12/06/2022)   [employer] of Occupational Health - Occupational Stress Questionnaire    Feeling of Stress : Rather much  Social Connections: Moderately Integrated (12/06/2022)   Social Connection and Isolation Panel [NHANES]    Frequency of Communication with Friends and Family: Three times a week    Frequency of Social Gatherings with Friends and Family: Three times a week    Attends Religious Services: 1 to 4 times per year    Active Member of Clubs or Organizations: No    Attends Banker Meetings: Never    Marital Status: Married  Catering manager Violence: Not At Risk (12/06/2022)   Humiliation, Afraid, Rape, and Kick questionnaire    Fear of Current or Ex-Partner: No    Emotionally Abused: No    Physically Abused: No    Sexually Abused: No    Review of Systems: See HPI, otherwise negative ROS  Physical Exam: Vital signs in last 24 hours: Temp:  [98.4 F (36.9 C)] 98.4 F (36.9 C) (10/15 1255) Pulse Rate:  [86] 86 (10/15 1255) Resp:  [22] 22 (10/15 1255) BP: (155)/(81) 155/81 (10/15 1255) SpO2:  [94 %] 94 % (10/15 1255)   General:   Alert,  Well-developed, well-nourished, pleasant and cooperative in NAD Head:  Normocephalic and atraumatic. Eyes:  Sclera clear, no icterus.   Conjunctiva pink. Ears:  Normal auditory acuity.  Nose:  No deformity, discharge,  or lesions. Msk:  Symmetrical without gross  deformities. Normal posture. Extremities:  Without clubbing or edema. Neurologic:  Alert and  oriented x4;  grossly normal neurologically. Skin:  Intact without significant lesions or rashes. Psych:  Alert and cooperative. Normal mood and affect.  Impression/Plan:Peggy Fitzgerald is a 67 y.o. female is here for a colonoscopy for positive cologuard .  The risks of the procedure including infection, bleed, or perforation as well as benefits, limitations, alternatives and imponderables have been reviewed with the patient. Questions have been answered. All parties agreeable.

## 2023-09-23 NOTE — Op Note (Signed)
[facility] Patient Name: Peggy Fitzgerald Procedure Date: 09/23/2023 12:45 PM MRN: [id number] Date of Birth: October 29, 1956 Attending MD: Sanjuan Dame , MD, [id number] CSN: [id number] Age: 67 Admit Type: Outpatient Procedure:                Colonoscopy Indications:              Positive Cologuard test Providers:                Sanjuan Dame, MD, Buel Ream. Thomasena Edis RN, RN,                            Francoise Ceo RN, RN, Elinor Parkinson Referring MD:              Medicines:                Monitored Anesthesia Care Complications:            No immediate complications. Estimated Blood Loss:     Estimated blood loss: none. Procedure:                Pre-Anesthesia Assessment:                           - Prior to the procedure, a History and Physical                            was performed, and patient medications and                            allergies were reviewed. The patient's tolerance of                            previous anesthesia was also reviewed. The risks                            and benefits of the procedure and the sedation                            options and risks were discussed with the patient.                            All questions were answered, and informed consent                            was obtained. Prior Anticoagulants: The patient has                            taken no anticoagulant or antiplatelet agents. ASA                            Grade Assessment: II - A patient with mild systemic                            disease. After reviewing the risks and benefits,  the patient was deemed in satisfactory condition to                            undergo the procedure.                           After obtaining informed consent, the colonoscope                            was passed under direct vision. Throughout the                            procedure, the patient's blood pressure, pulse, and                             oxygen saturations were monitored continuously. The                            [id number]) scope was introduced through the                            anus and advanced to the the terminal ileum. The                            colonoscopy was performed without difficulty. The                            patient tolerated the procedure well. The quality                            of the bowel preparation was evaluated using the                            BBPS [facility] Bowel Preparation Scale) with scores                            of: Right Colon = 3, Transverse Colon = 3 and Left                            Colon = 3 (entire mucosa seen well with no residual                            staining, small fragments of stool or opaque                            liquid). The total BBPS score equals 9. The                            terminal ileum, ileocecal valve, appendiceal                            orifice, and rectum were photographed. Scope In: 1:07:02 PM Scope Out: 1:21:02 PM Scope Withdrawal Time: 0 hours 12 minutes  28 seconds  Total Procedure Duration: 0 hours 14 minutes 0 seconds  Findings:      The perianal and digital rectal examinations were normal.      A few diverticula were found in the left colon.      Non-bleeding external and internal hemorrhoids were found during       retroflexion.      The terminal ileum appeared normal. Impression:               - Diverticulosis in the left colon.                           - Non-bleeding external and internal hemorrhoids.                           - The examined portion of the ileum was normal.                           - No specimens collected. Moderate Sedation:      Per Anesthesia Care Recommendation:           - Patient has a contact number available for                            emergencies. The signs and symptoms of potential                            delayed complications were discussed with the                             patient. Return to normal activities tomorrow.                            Written discharge instructions were provided to the                            patient.                           - Resume previous diet.                           - Continue present medications.                           - Repeat colonoscopy in 10 years for screening                            purposes. Procedure Code(s):        --- Professional ---                           [phone], Colonoscopy, flexible; diagnostic, including                            collection of specimen(s) by brushing or washing,  when performed (separate procedure) Diagnosis Code(s):        --- Professional ---                           K64.8, Other hemorrhoids                           R19.5, Other fecal abnormalities                           K57.30, Diverticulosis of large intestine without                            perforation or abscess without bleeding CPT copyright 2022 American Medical Association. All rights reserved. The codes documented in this report are preliminary and upon coder review may  be revised to meet current compliance requirements. Sanjuan Dame, MD Sanjuan Dame, MD 09/23/2023 1:26:07 PM This report has been signed electronically. Number of Addenda: 0

## 2023-09-23 NOTE — Transfer of Care (Signed)
Immediate Anesthesia Transfer of Care Note  Patient: Peggy Fitzgerald  Procedure(s) Performed: COLONOSCOPY WITH PROPOFOL  Patient Location: PACU  Anesthesia Type:General  Level of Consciousness: awake  Airway & Oxygen Therapy: Patient Spontanous Breathing  Post-op Assessment: Report given to RN  Post vital signs: Reviewed and stable  Last Vitals:  Vitals Value Taken Time  BP    Temp    Pulse    Resp    SpO2      Last Pain:  Vitals:   09/23/23 1255  TempSrc: Oral  PainSc: 0-No pain      Patients Stated Pain Goal: 3 (09/23/23 1255)  Complications: No notable events documented.

## 2023-09-23 NOTE — Anesthesia Postprocedure Evaluation (Signed)
Anesthesia Post Note  Patient: Peggy Fitzgerald  Procedure(s) Performed: COLONOSCOPY WITH PROPOFOL  Patient location during evaluation: Endoscopy Anesthesia Type: General Level of consciousness: awake and alert Pain management: pain level controlled Vital Signs Assessment: post-procedure vital signs reviewed and stable Respiratory status: spontaneous breathing Cardiovascular status: blood pressure returned to baseline and stable Postop Assessment: no apparent nausea or vomiting Anesthetic complications: no   No notable events documented.   Last Vitals:  Vitals:   09/23/23 1255  BP: (!) 155/81  Pulse: 86  Resp: (!) 22  Temp: 36.9 C  SpO2: 94%    Last Pain:  Vitals:   09/23/23 1255  TempSrc: Oral  PainSc: 0-No pain                 Esly Selvage

## 2023-09-25 ENCOUNTER — Encounter (INDEPENDENT_AMBULATORY_CARE_PROVIDER_SITE_OTHER): Payer: Self-pay | Admitting: *Deleted

## 2023-09-25 DIAGNOSIS — F3131 Bipolar disorder, current episode depressed, mild: Secondary | ICD-10-CM | POA: Diagnosis not present

## 2023-09-28 ENCOUNTER — Encounter: Payer: Self-pay | Admitting: Family Medicine

## 2023-09-29 ENCOUNTER — Other Ambulatory Visit: Payer: Self-pay

## 2023-09-29 DIAGNOSIS — E785 Hyperlipidemia, unspecified: Secondary | ICD-10-CM

## 2023-09-29 MED ORDER — ATORVASTATIN CALCIUM 40 MG PO TABS: 40.0000 mg | ORAL_TABLET | Freq: Every day | ORAL | 3 refills | Status: DC

## 2023-09-30 ENCOUNTER — Encounter (HOSPITAL_COMMUNITY): Payer: Self-pay | Admitting: Gastroenterology

## 2023-10-06 ENCOUNTER — Other Ambulatory Visit: Payer: Self-pay

## 2023-10-06 DIAGNOSIS — E785 Hyperlipidemia, unspecified: Secondary | ICD-10-CM

## 2023-10-06 MED ORDER — ATORVASTATIN CALCIUM 40 MG PO TABS: 40.0000 mg | ORAL_TABLET | Freq: Every day | ORAL | 3 refills | Status: DC

## 2023-10-07 DIAGNOSIS — F3131 Bipolar disorder, current episode depressed, mild: Secondary | ICD-10-CM | POA: Diagnosis not present

## 2023-10-13 NOTE — Progress Notes (Unsigned)
10/15/23 ALL: Peggy Fitzgerald returns for Botox. She reports migraines seem better until next procedure is due. Rizatriptan works well.   07/21/2023 ALL: Peggy Fitzgerald returns for Botox. Last procedure 04/2023. She feels headaches may have been better for a few weeks after last Botox treatment but have worsened over the past month. She reduced dose of Excedrin. Only using a couple times a week. She has taken rizatriptan more over the past month. Not drinking Energy drinks everyday but still drinks regularly. I have asked her to document migraines and treatment for the net 12 weeks.   04/22/2023 ALL: She returns for Botox. Last procedure 01/2023. She reports daily headaches continue. She may have 3-4 bad migraines per month. She continues Excedrin most every day. Usually 3-4 tablets of rizatriptan. [name] drinking Celsius Enerdy Drinks regularly. Maybe 16 ounces of water daily. Advised of rebound headaches and encouraged her to increase water and decrease caffeine.      Consent Form Botulism Toxin Injection For Chronic Migraine    Reviewed orally with patient, additionally signature is on file:  Botulism toxin has been approved by the Federal drug administration for treatment of chronic migraine. Botulism toxin does not cure chronic migraine and it may not be effective in some patients.  The administration of botulism toxin is accomplished by injecting a small amount of toxin into the muscles of the neck and head. Dosage must be titrated for each individual. Any benefits resulting from botulism toxin tend to wear off after 3 months with a repeat injection required if benefit is to be maintained. Injections are usually done every 3-4 months with maximum effect peak achieved by about 2 or 3 weeks. Botulism toxin is expensive and you should be sure of what costs you will incur resulting from the injection.  The side effects of botulism toxin use for chronic migraine may include:   -Transient, and usually mild,  facial weakness with facial injections  -Transient, and usually mild, head or neck weakness with head/neck injections  -Reduction or loss of forehead facial animation due to forehead muscle weakness  -Eyelid drooping  -Dry eye  -Pain at the site of injection or bruising at the site of injection  -Double vision  -Potential unknown long term risks   Contraindications: You should not have Botox if you are pregnant, nursing, allergic to albumin, have an infection, skin condition, or muscle weakness at the site of the injection, or have myasthenia gravis, Lambert-Eaton syndrome, or ALS.  It is also possible that as with any injection, there may be an allergic reaction or no effect from the medication. Reduced effectiveness after repeated injections is sometimes seen and rarely infection at the injection site may occur. All care will be taken to prevent these side effects. If therapy is given over a long time, atrophy and wasting in the muscle injected may occur. Occasionally the patient's become refractory to treatment because they develop antibodies to the toxin. In this event, therapy needs to be modified.  I have read the above information and consent to the administration of botulism toxin.    BOTOX PROCEDURE NOTE FOR MIGRAINE HEADACHE  Contraindications and precautions discussed with patient(above). Aseptic procedure was observed and patient tolerated procedure. Procedure performed by Shawnie Dapper, FNP-C.   The condition has existed for more than 6 months, and pt does not have a diagnosis of ALS, Myasthenia Gravis or Lambert-Eaton Syndrome.  Risks and benefits of injections discussed and pt agrees to proceed with the procedure.  Written consent  obtained  These injections are medically necessary. Pt  receives good benefits from these injections. These injections do not cause sedations or hallucinations which the oral therapies may cause.   Description of procedure:  The patient was placed in  a sitting position. The standard protocol was used for Botox as follows, with 5 units of Botox injected at each site:  -Procerus muscle, midline injection  -Corrugator muscle, bilateral injection  -Frontalis muscle, bilateral injection, with 2 sites each side, medial injection was performed in the upper one third of the frontalis muscle, in the region vertical from the medial inferior edge of the superior orbital rim. The lateral injection was again in the upper one third of the forehead vertically above the lateral limbus of the cornea, 1.5 cm lateral to the medial injection site.  -Temporalis muscle injection, 4 sites, bilaterally. The first injection was 3 cm above the tragus of the ear, second injection site was 1.5 cm to 3 cm up from the first injection site in line with the tragus of the ear. The third injection site was 1.5-3 cm forward between the first 2 injection sites. The fourth injection site was 1.5 cm posterior to the second injection site. 5th site laterally in the temporalis  muscleat the level of the outer canthus.  -Occipitalis muscle injection, 3 sites, bilaterally. The first injection was done one half way between the occipital protuberance and the tip of the mastoid process behind the ear. The second injection site was done lateral and superior to the first, 1 fingerbreadth from the first injection. The third injection site was 1 fingerbreadth superiorly and medially from the first injection site.  -Cervical paraspinal muscle injection, 2 sites, bilaterally. The first injection site was 1 cm from the midline of the cervical spine, 3 cm inferior to the lower border of the occipital protuberance. The second injection site was 1.5 cm superiorly and laterally to the first injection site.  -Trapezius muscle injection was performed at 3 sites, bilaterally. The first injection site was in the upper trapezius muscle halfway between the inflection point of the neck, and the acromion. The  second injection site was one half way between the acromion and the first injection site. The third injection was done between the first injection site and the inflection point of the neck.   Will return for repeat injection in 3 months.   A total of 200 units of Botox was prepared, 155 units of Botox was injected as documented above, any Botox not injected was wasted. The patient tolerated the procedure well, there were no complications of the above procedure.

## 2023-10-15 ENCOUNTER — Ambulatory Visit (INDEPENDENT_AMBULATORY_CARE_PROVIDER_SITE_OTHER): Payer: Medicare Other | Admitting: Family Medicine

## 2023-10-15 DIAGNOSIS — G43701 Chronic migraine without aura, not intractable, with status migrainosus: Secondary | ICD-10-CM | POA: Diagnosis not present

## 2023-10-15 DIAGNOSIS — G43019 Migraine without aura, intractable, without status migrainosus: Secondary | ICD-10-CM

## 2023-10-15 MED ORDER — ONABOTULINUMTOXINA 200 UNITS IJ SOLR
155.0000 [IU] | Freq: Once | INTRAMUSCULAR | Status: AC
Start: 2023-10-15 — End: 2023-10-15
  Administered 2023-10-15: 155 [IU] via INTRAMUSCULAR

## 2023-10-15 MED ORDER — RIZATRIPTAN BENZOATE 10 MG PO TABS: 10.0000 mg | ORAL_TABLET | ORAL | 3 refills | Status: DC | PRN

## 2023-10-15 NOTE — Progress Notes (Signed)
Botox- 200 units x 1 vial Lot: [id number] Expiration: 02/2026 NDC: 4782-9562-13  Bacteriostatic 0.9% Sodium Chloride- * mL  YQM:[id number] Expiration: 12/13/2023 GEX:5284-1324-40  Dx: N02.725 B/B Witnessed by: Marcelina Morel, RN

## 2023-10-17 DIAGNOSIS — F3131 Bipolar disorder, current episode depressed, mild: Secondary | ICD-10-CM | POA: Diagnosis not present

## 2023-10-22 DIAGNOSIS — M25512 Pain in left shoulder: Secondary | ICD-10-CM | POA: Diagnosis not present

## 2023-10-22 DIAGNOSIS — M25511 Pain in right shoulder: Secondary | ICD-10-CM | POA: Diagnosis not present

## 2023-10-22 DIAGNOSIS — M25552 Pain in left hip: Secondary | ICD-10-CM | POA: Diagnosis not present

## 2023-10-22 DIAGNOSIS — M25562 Pain in left knee: Secondary | ICD-10-CM | POA: Diagnosis not present

## 2023-10-23 DIAGNOSIS — F3131 Bipolar disorder, current episode depressed, mild: Secondary | ICD-10-CM | POA: Diagnosis not present

## 2023-10-23 DIAGNOSIS — F411 Generalized anxiety disorder: Secondary | ICD-10-CM | POA: Diagnosis not present

## 2023-10-23 DIAGNOSIS — F3132 Bipolar disorder, current episode depressed, moderate: Secondary | ICD-10-CM | POA: Diagnosis not present

## 2023-10-23 DIAGNOSIS — F319 Bipolar disorder, unspecified: Secondary | ICD-10-CM | POA: Diagnosis not present

## 2023-10-30 ENCOUNTER — Encounter: Payer: Self-pay | Admitting: Family Medicine

## 2023-10-30 DIAGNOSIS — F3131 Bipolar disorder, current episode depressed, mild: Secondary | ICD-10-CM | POA: Diagnosis not present

## 2023-11-03 ENCOUNTER — Encounter: Payer: Self-pay | Admitting: Family Medicine

## 2023-11-03 ENCOUNTER — Other Ambulatory Visit: Payer: Self-pay | Admitting: Family Medicine

## 2023-11-03 DIAGNOSIS — G43019 Migraine without aura, intractable, without status migrainosus: Secondary | ICD-10-CM

## 2023-11-04 MED ORDER — DICLOFENAC POTASSIUM 50 MG PO TABS: ORAL_TABLET | ORAL | 0 refills | Status: DC

## 2023-11-04 MED ORDER — RIZATRIPTAN BENZOATE 10 MG PO TABS: 10.0000 mg | ORAL_TABLET | ORAL | 0 refills | Status: DC | PRN

## 2023-11-04 NOTE — Telephone Encounter (Signed)
Last seen on 10/15/23 Follow up scheduled on 01/18/23

## 2023-11-04 NOTE — Telephone Encounter (Signed)
Requested medications are due for refill today.  yes  Requested medications are on the active medications list.  yes  Last refill. 02/26/2023 #90 0 rf  Future visit scheduled.   no  Notes to clinic.  Pt last seen 07/2023 - pt is dues for CPE.    Requested Prescriptions  Pending Prescriptions Disp Refills   montelukast (SINGULAIR) 10 MG tablet [Pharmacy Med Name: MONTELUKAST 10MG  TABLETS] 90 tablet 0    Sig: TAKE 1 TABLET(10 MG) BY MOUTH AT BEDTIME     Pulmonology:  Leukotriene Inhibitors Failed - 11/03/2023 11:45 AM      Failed - Valid encounter within last 12 months    Recent Outpatient Visits           1 year ago Pain of right lower extremity   [facility] Donita Brooks, MD   1 year ago Laryngitis   [facility] Donita Brooks, MD   2 years ago Vestibular migraine   [facility] Tanya Nones, Priscille Heidelberg, MD   2 years ago Atrophic vaginitis   [facility] [place], Velna Hatchet, MD   3 years ago Petechiae   [facility] Pickard, Priscille Heidelberg, MD

## 2023-11-07 DIAGNOSIS — F3131 Bipolar disorder, current episode depressed, mild: Secondary | ICD-10-CM | POA: Diagnosis not present

## 2023-11-11 ENCOUNTER — Other Ambulatory Visit: Payer: Self-pay

## 2023-11-11 ENCOUNTER — Other Ambulatory Visit: Payer: Self-pay | Admitting: Family Medicine

## 2023-11-11 DIAGNOSIS — F3131 Bipolar disorder, current episode depressed, mild: Secondary | ICD-10-CM | POA: Diagnosis not present

## 2023-11-11 MED ORDER — MONTELUKAST SODIUM 10 MG PO TABS: ORAL_TABLET | ORAL | 0 refills | Status: DC

## 2023-11-12 ENCOUNTER — Encounter: Payer: Self-pay | Admitting: Family Medicine

## 2023-11-12 ENCOUNTER — Ambulatory Visit: Payer: Medicare Other | Admitting: Family Medicine

## 2023-11-12 VITALS — BP 134/82 | HR 64 | Temp 98.6°F | Ht 66.0 in | Wt 192.2 lb

## 2023-11-12 DIAGNOSIS — Z23 Encounter for immunization: Secondary | ICD-10-CM

## 2023-11-12 DIAGNOSIS — M25561 Pain in right knee: Secondary | ICD-10-CM

## 2023-11-12 DIAGNOSIS — G8929 Other chronic pain: Secondary | ICD-10-CM | POA: Diagnosis not present

## 2023-11-12 DIAGNOSIS — M25512 Pain in left shoulder: Secondary | ICD-10-CM | POA: Diagnosis not present

## 2023-11-12 NOTE — Progress Notes (Signed)
Subjective:  HPI: Peggy Fitzgerald is a 67 y.o. female presenting on 11/12/2023 for Shoulder Pain (Left sided x 3 days. Rates pain at level 15.) and Knee Pain (Right sided x 3 days. Rates pain at level 11. )   HPI Patient is in today for left shoulder pain from shoulder to elbow 15/10 since Sunday night. Has tried ice, heat, Hydrocodone, muscle relaxer. She also has right knee pain 11/10 since Sunday. No trauma, injury, fall, heavy lifting. No recent medication changes. She does have chronic left shoulder, right knee, and right hip pain for which she gets cortisone shots for, her last shot was 2 months ago at [facility]. Per ortho notes on 10/22/2023 she was treated with local cortisone injections and Hydrocodone for left hip bursitis and secondary knee pain and left shoulder impingement. She has tried PT in the past. She denies fever, chills, body aches, numbness, tingling, or weakness in any extremity.   Review of Systems  All other systems reviewed and are negative.   Relevant past medical history reviewed and updated as indicated.   Past Medical History:  Diagnosis Date   AC (acromioclavicular) joint bone spurs, right    Allergy    seasonal   Anxiety    Asthma    asthmatic bronchitis   Bipolar 1 disorder, depressed (HCC)    Candida infection    Cataract 2007   Depression    Genital herpes    GERD (gastroesophageal reflux disease)    Headache    Herpes simplex type 2 infection    High cholesterol    Hyperlipidemia    Hypertension    Migraine    Osteopenia    Plantar fasciitis of right foot    Prediabetes    Rotator cuff tear    right shoulder     Past Surgical History:  Procedure Laterality Date   CATARACT EXTRACTION, BILATERAL     CHOLECYSTECTOMY  1987   COLONOSCOPY WITH PROPOFOL N/A 09/23/2023   Procedure: COLONOSCOPY WITH PROPOFOL;  Surgeon: Franky Macho, MD;  Location: [facility];  Service: Endoscopy;  Laterality: N/A;  2:00 PM,ASA 2   KNEE  SURGERY Right     Allergies and medications reviewed and updated.   Current Outpatient Medications:    acyclovir ointment (ZOVIRAX) 5 %, Apply 1 Application topically 2 (two) times daily as needed (fever blisters)., Disp: , Rfl:    albuterol (VENTOLIN HFA) 108 (90 Base) MCG/ACT inhaler, Inhale 2 puffs into the lungs every 4 (four) hours as needed., Disp: 18 g, Rfl: 0   atorvastatin (LIPITOR) 40 MG tablet, Take 1 tablet (40 mg total) by mouth daily., Disp: 90 tablet, Rfl: 3   BIOTIN PO, Take 25,000 mcg by mouth daily., Disp: , Rfl:    Boric Acid Vaginal 600 MG SUPP, Place 600 mg vaginally at bedtime as needed (yeast infections)., Disp: , Rfl:    CALCIUM PO, Take 1,000 mg by mouth daily., Disp: , Rfl:    Cholecalciferol (VITAMIN D3) 250 MCG (10000 UT) TABS, Take 10,000 Units by mouth daily., Disp: , Rfl:    clobetasol (TEMOVATE) 0.05 % external solution, APPLY 1 APPLICATION TOPICALLY TWICE A DAY AS NEEDED, APPLY TO SCALP AS NEEDED FOR IRRITATION, Disp: 150 mL, Rfl: 3   diclofenac (CATAFLAM) 50 MG tablet, Take 50-100 mg at onset of migraine. Max dose 2 pills in 24 hours, Disp: 45 tablet, Rfl: 0   diphenoxylate-atropine (LOMOTIL) 2.5-0.025 MG tablet, Take 1 tablet by mouth 4 (four) times  daily as needed for diarrhea or loose stools., Disp: 30 tablet, Rfl: 0   estradiol (ESTRACE) 0.1 MG/GM vaginal cream, PLACE 1 GRAM VAGINALLY AT BEDTIME, Disp: 42.5 g, Rfl: 7   fluticasone (FLONASE) 50 MCG/ACT nasal spray, Place 2 sprays into both nostrils daily. (Patient taking differently: Place 2 sprays into both nostrils daily as needed for allergies.), Disp: 16 g, Rfl: 6   HYDROcodone-acetaminophen (NORCO/VICODIN) 5-325 MG tablet, Take 0.5 tablets by mouth 2 (two) times daily as needed for severe pain., Disp: , Rfl:    ketoconazole (NIZORAL) 2 % cream, Apply 1 Application topically 2 (two) times daily as needed for irritation., Disp: 60 g, Rfl: 1   lamoTRIgine (LAMICTAL) 100 MG tablet, Take 100 mg by mouth 2  (two) times daily., Disp: , Rfl:    LATUDA 80 MG TABS tablet, Take 80 mg by mouth at bedtime., Disp: , Rfl:    LORazepam (ATIVAN) 0.5 MG tablet, Take 1 tablet (0.5 mg total) by mouth every 8 (eight) hours as needed for anxiety (itching)., Disp: 30 tablet, Rfl: 0   lurasidone (LATUDA) 20 MG TABS tablet, Take 20 mg by mouth every morning., Disp: , Rfl:    meclizine (ANTIVERT) 25 MG tablet, TAKE 1 TABLET AS NEEDED FOR DIZZINESS, Disp: 90 tablet, Rfl: 3   methocarbamol (ROBAXIN) 500 MG tablet, TAKE 1 TABLET FOUR TIMES A DAY (Patient taking differently: Take 500 mg by mouth 4 (four) times daily as needed for muscle spasms.), Disp: 90 tablet, Rfl: 14   metoprolol succinate (TOPROL-XL) 100 MG 24 hr tablet, TAKE 1 TABLET AT BEDTIME WITH OR IMMEDIATELY FOLLOWING A MEAL  (CALL CLINIC TO SCHEDULE APPOINTMENT FOR FUTURE REFILLS. [phone]) (Patient taking differently: Take 50 mg by mouth in the morning and at bedtime.), Disp: 90 tablet, Rfl: 3   montelukast (SINGULAIR) 10 MG tablet, TAKE 1 TABLET(10 MG) BY MOUTH AT BEDTIME, Disp: 30 tablet, Rfl: 0   ondansetron (ZOFRAN-ODT) 4 MG disintegrating tablet, Take 1 tablet (4 mg total) by mouth every 8 (eight) hours as needed for nausea or vomiting., Disp: 20 tablet, Rfl: 0   promethazine (PHENERGAN) 25 MG tablet, TAKE 1 TABLET EVERY 6 HOURS AS NEEDED FOR NAUSEA OR VOMITING, Disp: 90 tablet, Rfl: 3   promethazine-dextromethorphan (PROMETHAZINE-DM) 6.25-15 MG/5ML syrup, Take 5 mLs by mouth 4 (four) times daily as needed., Disp: 100 mL, Rfl: 0   rizatriptan (MAXALT) 10 MG tablet, Take 1 tablet (10 mg total) by mouth as needed for migraine. May repeat in 2 hours if needed, Disp: 30 tablet, Rfl: 0   terconazole (TERAZOL 7) 0.4 % vaginal cream, Place 1 applicator vaginally at bedtime. (Patient taking differently: Place 1 applicator vaginally at bedtime as needed (yeast infections).), Disp: 45 g, Rfl: 6   valACYclovir (VALTREX) 500 MG tablet, Take 1 tablet (500 mg total) by  mouth daily as needed (breakouts)., Disp: 30 tablet, Rfl: 2   zolpidem (AMBIEN CR) 6.25 MG CR tablet, Take 6.25 mg by mouth at bedtime., Disp: , Rfl:    hydrOXYzine (ATARAX/VISTARIL) 25 MG tablet, Take 1 tablet (25 mg total) by mouth 3 (three) times daily as needed. (Patient taking differently: Take 25 mg by mouth at bedtime.), Disp: , Rfl:   Allergies  Allergen Reactions   Hydrocodone-Acetaminophen Other (See Comments)    Headaches on long term use   Milk-Related Compounds Diarrhea    Cannot drink plain milk, but can eat ice cream, and milk in cereal   Oxycodone-Acetaminophen Itching    Nightmares  Objective:   BP 134/82 (BP Location: Left Arm)   Pulse 64   Temp 98.6 F (37 C)   Ht 5\' 6"  (1.676 m)   Wt 192 lb 4 oz (87.2 kg)   SpO2 98%   BMI 31.03 kg/m      11/12/2023   10:36 AM 09/23/2023    1:31 PM 09/23/2023   12:55 PM  Vitals with BMI  Height 5\' 6"     Weight 192 lbs 4 oz    BMI 31.04    Systolic 134 110 532  Diastolic 82 73 81  Pulse 64 79 86     Physical Exam Vitals and nursing note reviewed.  Constitutional:      Appearance: Normal appearance. [name] normal weight.  HENT:     Head: Normocephalic and atraumatic.  Musculoskeletal:     Right shoulder: Normal.     Left shoulder: No swelling, effusion or crepitus. Decreased range of motion.     Right knee: No swelling, effusion, erythema, bony tenderness or crepitus. Normal range of motion. Tenderness present over the medial joint line. No LCL laxity, MCL laxity, ACL laxity or PCL laxity.  Skin:    General: Skin is warm and dry.  Neurological:     General: No focal deficit present.     Mental Status: [name] alert and oriented to person, place, and time. Mental status is at baseline.  Psychiatric:        Mood and Affect: Mood normal.        Behavior: Behavior normal.        Thought Content: Thought content normal.        Judgment: Judgment normal.     Assessment & Plan:  Chronic pain of right  knee Assessment & Plan: Chronic uncontrolled. Followed by Ortho. Recent steroid injection on 11/13 and she is prescribed Hydrocodone as well. No signs of effusion or infection. Recommended she follow up with ortho for further recommendations.   Flu vaccine need -     Flu Vaccine Trivalent High Dose (Fluad)  Chronic left shoulder pain Assessment & Plan: Chronic uncontrolled. Followed by Ortho. Recent steroid injection on 11/13 and she is prescribed Hydrocodone as well. No signs of effusion or infection. Recommended she follow up with ortho for further recommendations.      Follow up plan: Return if symptoms worsen or fail to improve, for AWV.  Park Meo, FNP

## 2023-11-12 NOTE — Assessment & Plan Note (Signed)
Chronic uncontrolled. Followed by Ortho. Recent steroid injection on 11/13 and she is prescribed Hydrocodone as well. No signs of effusion or infection. Recommended she follow up with ortho for further recommendations.

## 2023-11-13 NOTE — Telephone Encounter (Signed)
Requested medication (s) are due for refill today: signed 11/11/23  Requested medication (s) are on the active medication list: yes   Last refill:  11/11/23 #30 0 refills  Future visit scheduled: no   Notes to clinic:  courtesy refill given 11/11/23. Patient requesting 90 day supply. Last OV 09/04/23 documented needs CPE scheduled.  Do you want to give 90 day supply?     Requested Prescriptions  Pending Prescriptions Disp Refills   montelukast (SINGULAIR) 10 MG tablet [Pharmacy Med Name: MONTELUKAST 10MG  TABLETS] 90 tablet     Sig: TAKE 1 TABLET(10 MG) BY MOUTH AT BEDTIME     Pulmonology:  Leukotriene Inhibitors Failed - 11/11/2023  5:50 PM      Failed - Valid encounter within last 12 months    Recent Outpatient Visits           1 year ago Pain of right lower extremity   [facility] Donita Brooks, MD   2 years ago Laryngitis   [facility] Tanya Nones, Priscille Heidelberg, MD   2 years ago Vestibular migraine   [facility] Tanya Nones, Priscille Heidelberg, MD   2 years ago Atrophic vaginitis   [facility] [place], Velna Hatchet, MD   3 years ago Petechiae   [facility] Pickard, Priscille Heidelberg, MD

## 2023-11-20 DIAGNOSIS — F3131 Bipolar disorder, current episode depressed, mild: Secondary | ICD-10-CM | POA: Diagnosis not present

## 2023-11-25 DIAGNOSIS — F3131 Bipolar disorder, current episode depressed, mild: Secondary | ICD-10-CM | POA: Diagnosis not present

## 2023-11-27 ENCOUNTER — Encounter: Payer: Self-pay | Admitting: Family Medicine

## 2023-12-04 DIAGNOSIS — F3131 Bipolar disorder, current episode depressed, mild: Secondary | ICD-10-CM | POA: Diagnosis not present

## 2023-12-11 ENCOUNTER — Encounter: Payer: Self-pay | Admitting: Family Medicine

## 2023-12-11 ENCOUNTER — Other Ambulatory Visit: Payer: Self-pay | Admitting: Family Medicine

## 2023-12-11 DIAGNOSIS — F3131 Bipolar disorder, current episode depressed, mild: Secondary | ICD-10-CM | POA: Diagnosis not present

## 2023-12-11 MED ORDER — METHOCARBAMOL 500 MG PO TABS: 500.0000 mg | ORAL_TABLET | Freq: Four times a day (QID) | ORAL | 3 refills | Status: AC

## 2023-12-16 DIAGNOSIS — F3131 Bipolar disorder, current episode depressed, mild: Secondary | ICD-10-CM | POA: Diagnosis not present

## 2023-12-22 ENCOUNTER — Other Ambulatory Visit: Payer: Self-pay | Admitting: Family Medicine

## 2023-12-23 NOTE — Telephone Encounter (Signed)
 Requested Prescriptions  Pending Prescriptions Disp Refills   montelukast  (SINGULAIR ) 10 MG tablet [Pharmacy Med Name: MONTELUKAST  10MG  TABLETS] 90 tablet 1    Sig: TAKE 1 TABLET(10 MG) BY MOUTH AT BEDTIME     Pulmonology:  Leukotriene Inhibitors Failed - 12/23/2023  2:21 PM      Failed - Valid encounter within last 12 months    Recent Outpatient Visits           1 year ago Pain of right lower extremity   [facility] Duanne Butler DASEN, MD   2 years ago Laryngitis   [facility] Duanne, Butler DASEN, MD   2 years ago Vestibular migraine   [facility] Duanne, Butler DASEN, MD   3 years ago Atrophic vaginitis   [facility] [place], Theodoro FALCON, MD   3 years ago Petechiae   [facility] Pickard, Butler DASEN, MD

## 2023-12-24 ENCOUNTER — Encounter: Payer: Self-pay | Admitting: Family Medicine

## 2023-12-25 ENCOUNTER — Telehealth: Payer: Self-pay

## 2023-12-25 DIAGNOSIS — M25552 Pain in left hip: Secondary | ICD-10-CM | POA: Diagnosis not present

## 2023-12-25 DIAGNOSIS — M25562 Pain in left knee: Secondary | ICD-10-CM | POA: Diagnosis not present

## 2023-12-25 DIAGNOSIS — M25512 Pain in left shoulder: Secondary | ICD-10-CM | POA: Diagnosis not present

## 2023-12-25 NOTE — Telephone Encounter (Deleted)
Pt Last Seen 10/15/2023 (NP, Amy Lomax) Upcoming Appointment 01/19/2024 ( NP, Amy Lomax)  Rizatriptan 10mg  Last filled

## 2023-12-26 DIAGNOSIS — F3131 Bipolar disorder, current episode depressed, mild: Secondary | ICD-10-CM | POA: Diagnosis not present

## 2023-12-29 DIAGNOSIS — M25531 Pain in right wrist: Secondary | ICD-10-CM | POA: Diagnosis not present

## 2023-12-30 DIAGNOSIS — F3131 Bipolar disorder, current episode depressed, mild: Secondary | ICD-10-CM | POA: Diagnosis not present

## 2023-12-31 DIAGNOSIS — F3132 Bipolar disorder, current episode depressed, moderate: Secondary | ICD-10-CM | POA: Diagnosis not present

## 2023-12-31 DIAGNOSIS — F319 Bipolar disorder, unspecified: Secondary | ICD-10-CM | POA: Diagnosis not present

## 2023-12-31 DIAGNOSIS — F411 Generalized anxiety disorder: Secondary | ICD-10-CM | POA: Diagnosis not present

## 2024-01-06 DIAGNOSIS — F3131 Bipolar disorder, current episode depressed, mild: Secondary | ICD-10-CM | POA: Diagnosis not present

## 2024-01-07 ENCOUNTER — Ambulatory Visit: Payer: Medicare Other | Admitting: Family Medicine

## 2024-01-08 ENCOUNTER — Ambulatory Visit: Payer: Medicare Other | Admitting: Podiatry

## 2024-01-09 DIAGNOSIS — M25531 Pain in right wrist: Secondary | ICD-10-CM | POA: Diagnosis not present

## 2024-01-12 NOTE — Progress Notes (Deleted)
 01/12/24 ALL: Peggy Fitzgerald returns for Botox.   10/15/2023 ALL: Peggy Fitzgerald returns for Botox. She reports migraines seem better until next procedure is due. Rizatriptan works well.   07/21/2023 ALL: Peggy Fitzgerald returns for Botox. Last procedure 04/2023. She feels headaches may have been better for a few weeks after last Botox treatment but have worsened over the past month. She reduced dose of Excedrin. Only using a couple times a week. She has taken rizatriptan more over the past month. Not drinking Energy drinks everyday but still drinks regularly. I have asked her to document migraines and treatment for the net 12 weeks.   04/22/2023 ALL: She returns for Botox. Last procedure 01/2023. She reports daily headaches continue. She may have 3-4 bad migraines per month. She continues Excedrin most every day. Usually 3-4 tablets of rizatriptan. [name] drinking Celsius Enerdy Drinks regularly. Maybe 16 ounces of water daily. Advised of rebound headaches and encouraged her to increase water and decrease caffeine.      Consent Form Botulism Toxin Injection For Chronic Migraine    Reviewed orally with patient, additionally signature is on file:  Botulism toxin has been approved by the Federal drug administration for treatment of chronic migraine. Botulism toxin does not cure chronic migraine and it may not be effective in some patients.  The administration of botulism toxin is accomplished by injecting a small amount of toxin into the muscles of the neck and head. Dosage must be titrated for each individual. Any benefits resulting from botulism toxin tend to wear off after 3 months with a repeat injection required if benefit is to be maintained. Injections are usually done every 3-4 months with maximum effect peak achieved by about 2 or 3 weeks. Botulism toxin is expensive and you should be sure of what costs you will incur resulting from the injection.  The side effects of botulism toxin use for chronic migraine  may include:   -Transient, and usually mild, facial weakness with facial injections  -Transient, and usually mild, head or neck weakness with head/neck injections  -Reduction or loss of forehead facial animation due to forehead muscle weakness  -Eyelid drooping  -Dry eye  -Pain at the site of injection or bruising at the site of injection  -Double vision  -Potential unknown long term risks   Contraindications: You should not have Botox if you are pregnant, nursing, allergic to albumin, have an infection, skin condition, or muscle weakness at the site of the injection, or have myasthenia gravis, Lambert-Eaton syndrome, or ALS.  It is also possible that as with any injection, there may be an allergic reaction or no effect from the medication. Reduced effectiveness after repeated injections is sometimes seen and rarely infection at the injection site may occur. All care will be taken to prevent these side effects. If therapy is given over a long time, atrophy and wasting in the muscle injected may occur. Occasionally the patient's become refractory to treatment because they develop antibodies to the toxin. In this event, therapy needs to be modified.  I have read the above information and consent to the administration of botulism toxin.    BOTOX PROCEDURE NOTE FOR MIGRAINE HEADACHE  Contraindications and precautions discussed with patient(above). Aseptic procedure was observed and patient tolerated procedure. Procedure performed by Shawnie Dapper, FNP-C.   The condition has existed for more than 6 months, and pt does not have a diagnosis of ALS, Myasthenia Gravis or Lambert-Eaton Syndrome.  Risks and benefits of injections discussed and pt agrees  to proceed with the procedure.  Written consent obtained  These injections are medically necessary. Pt  receives good benefits from these injections. These injections do not cause sedations or hallucinations which the oral therapies may  cause.   Description of procedure:  The patient was placed in a sitting position. The standard protocol was used for Botox as follows, with 5 units of Botox injected at each site:  -Procerus muscle, midline injection  -Corrugator muscle, bilateral injection  -Frontalis muscle, bilateral injection, with 2 sites each side, medial injection was performed in the upper one third of the frontalis muscle, in the region vertical from the medial inferior edge of the superior orbital rim. The lateral injection was again in the upper one third of the forehead vertically above the lateral limbus of the cornea, 1.5 cm lateral to the medial injection site.  -Temporalis muscle injection, 4 sites, bilaterally. The first injection was 3 cm above the tragus of the ear, second injection site was 1.5 cm to 3 cm up from the first injection site in line with the tragus of the ear. The third injection site was 1.5-3 cm forward between the first 2 injection sites. The fourth injection site was 1.5 cm posterior to the second injection site. 5th site laterally in the temporalis  muscleat the level of the outer canthus.  -Occipitalis muscle injection, 3 sites, bilaterally. The first injection was done one half way between the occipital protuberance and the tip of the mastoid process behind the ear. The second injection site was done lateral and superior to the first, 1 fingerbreadth from the first injection. The third injection site was 1 fingerbreadth superiorly and medially from the first injection site.  -Cervical paraspinal muscle injection, 2 sites, bilaterally. The first injection site was 1 cm from the midline of the cervical spine, 3 cm inferior to the lower border of the occipital protuberance. The second injection site was 1.5 cm superiorly and laterally to the first injection site.  -Trapezius muscle injection was performed at 3 sites, bilaterally. The first injection site was in the upper trapezius muscle  halfway between the inflection point of the neck, and the acromion. The second injection site was one half way between the acromion and the first injection site. The third injection was done between the first injection site and the inflection point of the neck.   Will return for repeat injection in 3 months.   A total of 200 units of Botox was prepared, 155 units of Botox was injected as documented above, any Botox not injected was wasted. The patient tolerated the procedure well, there were no complications of the above procedure.

## 2024-01-13 DIAGNOSIS — F3131 Bipolar disorder, current episode depressed, mild: Secondary | ICD-10-CM | POA: Diagnosis not present

## 2024-01-19 ENCOUNTER — Ambulatory Visit: Payer: Medicare Other | Admitting: Family Medicine

## 2024-01-19 DIAGNOSIS — G43019 Migraine without aura, intractable, without status migrainosus: Secondary | ICD-10-CM

## 2024-01-19 NOTE — Progress Notes (Deleted)
 01/19/24 ALL: Peggy Fitzgerald returns for Botox.   10/15/2023 ALL: Peggy Fitzgerald returns for Botox. She reports migraines seem better until next procedure is due. Rizatriptan works well.   07/21/2023 ALL: Peggy Fitzgerald returns for Botox. Last procedure 04/2023. She feels headaches may have been better for a few weeks after last Botox treatment but have worsened over the past month. She reduced dose of Excedrin. Only using a couple times a week. She has taken rizatriptan more over the past month. Not drinking Energy drinks everyday but still drinks regularly. I have asked her to document migraines and treatment for the net 12 weeks.   04/22/2023 ALL: She returns for Botox. Last procedure 01/2023. She reports daily headaches continue. She may have 3-4 bad migraines per month. She continues Excedrin most every day. Usually 3-4 tablets of rizatriptan. [name] drinking Celsius Enerdy Drinks regularly. Maybe 16 ounces of water daily. Advised of rebound headaches and encouraged her to increase water and decrease caffeine.      Consent Form Botulism Toxin Injection For Chronic Migraine    Reviewed orally with patient, additionally signature is on file:  Botulism toxin has been approved by the Federal drug administration for treatment of chronic migraine. Botulism toxin does not cure chronic migraine and it may not be effective in some patients.  The administration of botulism toxin is accomplished by injecting a small amount of toxin into the muscles of the neck and head. Dosage must be titrated for each individual. Any benefits resulting from botulism toxin tend to wear off after 3 months with a repeat injection required if benefit is to be maintained. Injections are usually done every 3-4 months with maximum effect peak achieved by about 2 or 3 weeks. Botulism toxin is expensive and you should be sure of what costs you will incur resulting from the injection.  The side effects of botulism toxin use for chronic migraine  may include:   -Transient, and usually mild, facial weakness with facial injections  -Transient, and usually mild, head or neck weakness with head/neck injections  -Reduction or loss of forehead facial animation due to forehead muscle weakness  -Eyelid drooping  -Dry eye  -Pain at the site of injection or bruising at the site of injection  -Double vision  -Potential unknown long term risks   Contraindications: You should not have Botox if you are pregnant, nursing, allergic to albumin, have an infection, skin condition, or muscle weakness at the site of the injection, or have myasthenia gravis, Lambert-Eaton syndrome, or ALS.  It is also possible that as with any injection, there may be an allergic reaction or no effect from the medication. Reduced effectiveness after repeated injections is sometimes seen and rarely infection at the injection site may occur. All care will be taken to prevent these side effects. If therapy is given over a long time, atrophy and wasting in the muscle injected may occur. Occasionally the patient's become refractory to treatment because they develop antibodies to the toxin. In this event, therapy needs to be modified.  I have read the above information and consent to the administration of botulism toxin.    BOTOX PROCEDURE NOTE FOR MIGRAINE HEADACHE  Contraindications and precautions discussed with patient(above). Aseptic procedure was observed and patient tolerated procedure. Procedure performed by Shawnie Dapper, FNP-C.   The condition has existed for more than 6 months, and pt does not have a diagnosis of ALS, Myasthenia Gravis or Lambert-Eaton Syndrome.  Risks and benefits of injections discussed and pt agrees  to proceed with the procedure.  Written consent obtained  These injections are medically necessary. Pt  receives good benefits from these injections. These injections do not cause sedations or hallucinations which the oral therapies may  cause.   Description of procedure:  The patient was placed in a sitting position. The standard protocol was used for Botox as follows, with 5 units of Botox injected at each site:  -Procerus muscle, midline injection  -Corrugator muscle, bilateral injection  -Frontalis muscle, bilateral injection, with 2 sites each side, medial injection was performed in the upper one third of the frontalis muscle, in the region vertical from the medial inferior edge of the superior orbital rim. The lateral injection was again in the upper one third of the forehead vertically above the lateral limbus of the cornea, 1.5 cm lateral to the medial injection site.  -Temporalis muscle injection, 4 sites, bilaterally. The first injection was 3 cm above the tragus of the ear, second injection site was 1.5 cm to 3 cm up from the first injection site in line with the tragus of the ear. The third injection site was 1.5-3 cm forward between the first 2 injection sites. The fourth injection site was 1.5 cm posterior to the second injection site. 5th site laterally in the temporalis  muscleat the level of the outer canthus.  -Occipitalis muscle injection, 3 sites, bilaterally. The first injection was done one half way between the occipital protuberance and the tip of the mastoid process behind the ear. The second injection site was done lateral and superior to the first, 1 fingerbreadth from the first injection. The third injection site was 1 fingerbreadth superiorly and medially from the first injection site.  -Cervical paraspinal muscle injection, 2 sites, bilaterally. The first injection site was 1 cm from the midline of the cervical spine, 3 cm inferior to the lower border of the occipital protuberance. The second injection site was 1.5 cm superiorly and laterally to the first injection site.  -Trapezius muscle injection was performed at 3 sites, bilaterally. The first injection site was in the upper trapezius muscle  halfway between the inflection point of the neck, and the acromion. The second injection site was one half way between the acromion and the first injection site. The third injection was done between the first injection site and the inflection point of the neck.   Will return for repeat injection in 3 months.   A total of 200 units of Botox was prepared, 155 units of Botox was injected as documented above, any Botox not injected was wasted. The patient tolerated the procedure well, there were no complications of the above procedure.

## 2024-01-20 ENCOUNTER — Telehealth: Payer: Self-pay | Admitting: Family Medicine

## 2024-01-20 ENCOUNTER — Encounter: Payer: Self-pay | Admitting: Family Medicine

## 2024-01-20 NOTE — Telephone Encounter (Signed)
At 2:13 pt left a vm asking to be called to r/s her Botox due to still having vertigo.  Pt has been r/s and is on wait list.

## 2024-01-21 ENCOUNTER — Ambulatory Visit: Payer: Medicare Other | Admitting: Family Medicine

## 2024-01-21 ENCOUNTER — Other Ambulatory Visit: Payer: Self-pay

## 2024-01-21 DIAGNOSIS — R42 Dizziness and giddiness: Secondary | ICD-10-CM

## 2024-01-21 MED ORDER — MECLIZINE HCL 25 MG PO TABS: ORAL_TABLET | ORAL | 3 refills | Status: AC

## 2024-01-23 DIAGNOSIS — F3131 Bipolar disorder, current episode depressed, mild: Secondary | ICD-10-CM | POA: Diagnosis not present

## 2024-01-29 DIAGNOSIS — F3131 Bipolar disorder, current episode depressed, mild: Secondary | ICD-10-CM | POA: Diagnosis not present

## 2024-02-03 DIAGNOSIS — F3131 Bipolar disorder, current episode depressed, mild: Secondary | ICD-10-CM | POA: Diagnosis not present

## 2024-02-04 DIAGNOSIS — F319 Bipolar disorder, unspecified: Secondary | ICD-10-CM | POA: Diagnosis not present

## 2024-02-04 DIAGNOSIS — F3132 Bipolar disorder, current episode depressed, moderate: Secondary | ICD-10-CM | POA: Diagnosis not present

## 2024-02-04 DIAGNOSIS — F411 Generalized anxiety disorder: Secondary | ICD-10-CM | POA: Diagnosis not present

## 2024-02-09 DIAGNOSIS — F3131 Bipolar disorder, current episode depressed, mild: Secondary | ICD-10-CM | POA: Diagnosis not present

## 2024-02-09 NOTE — Progress Notes (Unsigned)
 02/12/24 ALL: Peggy Fitzgerald returns for Botox. Last procedure 10/2023. She reports doing well. She has had more headaches over the past month. Under more stress as father may have cancer. [name] using rizatriptan and diclofenac as needed and this works well for abortive therapy.   10/15/2023 ALL: Peggy Fitzgerald returns for Botox. She reports migraines seem better until next procedure is due. Rizatriptan works well.   07/21/2023 ALL: Peggy Fitzgerald returns for Botox. Last procedure 04/2023. She feels headaches may have been better for a few weeks after last Botox treatment but have worsened over the past month. She reduced dose of Excedrin. Only using a couple times a week. She has taken rizatriptan more over the past month. Not drinking Energy drinks everyday but still drinks regularly. I have asked her to document migraines and treatment for the net 12 weeks.   04/22/2023 ALL: She returns for Botox. Last procedure 01/2023. She reports daily headaches continue. She may have 3-4 bad migraines per month. She continues Excedrin most every day. Usually 3-4 tablets of rizatriptan. [name] drinking Celsius Enerdy Drinks regularly. Maybe 16 ounces of water daily. Advised of rebound headaches and encouraged her to increase water and decrease caffeine.      Consent Form Botulism Toxin Injection For Chronic Migraine    Reviewed orally with patient, additionally signature is on file:  Botulism toxin has been approved by the Federal drug administration for treatment of chronic migraine. Botulism toxin does not cure chronic migraine and it may not be effective in some patients.  The administration of botulism toxin is accomplished by injecting a small amount of toxin into the muscles of the neck and head. Dosage must be titrated for each individual. Any benefits resulting from botulism toxin tend to wear off after 3 months with a repeat injection required if benefit is to be maintained. Injections are usually done every 3-4  months with maximum effect peak achieved by about 2 or 3 weeks. Botulism toxin is expensive and you should be sure of what costs you will incur resulting from the injection.  The side effects of botulism toxin use for chronic migraine may include:   -Transient, and usually mild, facial weakness with facial injections  -Transient, and usually mild, head or neck weakness with head/neck injections  -Reduction or loss of forehead facial animation due to forehead muscle weakness  -Eyelid drooping  -Dry eye  -Pain at the site of injection or bruising at the site of injection  -Double vision  -Potential unknown long term risks   Contraindications: You should not have Botox if you are pregnant, nursing, allergic to albumin, have an infection, skin condition, or muscle weakness at the site of the injection, or have myasthenia gravis, Lambert-Eaton syndrome, or ALS.  It is also possible that as with any injection, there may be an allergic reaction or no effect from the medication. Reduced effectiveness after repeated injections is sometimes seen and rarely infection at the injection site may occur. All care will be taken to prevent these side effects. If therapy is given over a long time, atrophy and wasting in the muscle injected may occur. Occasionally the patient's become refractory to treatment because they develop antibodies to the toxin. In this event, therapy needs to be modified.  I have read the above information and consent to the administration of botulism toxin.    BOTOX PROCEDURE NOTE FOR MIGRAINE HEADACHE  Contraindications and precautions discussed with patient(above). Aseptic procedure was observed and patient tolerated procedure. Procedure performed  by Shawnie Dapper, FNP-C.   The condition has existed for more than 6 months, and pt does not have a diagnosis of ALS, Myasthenia Gravis or Lambert-Eaton Syndrome.  Risks and benefits of injections discussed and pt agrees to proceed with the  procedure.  Written consent obtained  These injections are medically necessary. Pt  receives good benefits from these injections. These injections do not cause sedations or hallucinations which the oral therapies may cause.   Description of procedure:  The patient was placed in a sitting position. The standard protocol was used for Botox as follows, with 5 units of Botox injected at each site:  -Procerus muscle, midline injection  -Corrugator muscle, bilateral injection  -Frontalis muscle, bilateral injection, with 2 sites each side, medial injection was performed in the upper one third of the frontalis muscle, in the region vertical from the medial inferior edge of the superior orbital rim. The lateral injection was again in the upper one third of the forehead vertically above the lateral limbus of the cornea, 1.5 cm lateral to the medial injection site.  -Temporalis muscle injection, 4 sites, bilaterally. The first injection was 3 cm above the tragus of the ear, second injection site was 1.5 cm to 3 cm up from the first injection site in line with the tragus of the ear. The third injection site was 1.5-3 cm forward between the first 2 injection sites. The fourth injection site was 1.5 cm posterior to the second injection site. 5th site laterally in the temporalis  muscleat the level of the outer canthus.  -Occipitalis muscle injection, 3 sites, bilaterally. The first injection was done one half way between the occipital protuberance and the tip of the mastoid process behind the ear. The second injection site was done lateral and superior to the first, 1 fingerbreadth from the first injection. The third injection site was 1 fingerbreadth superiorly and medially from the first injection site.  -Cervical paraspinal muscle injection, 2 sites, bilaterally. The first injection site was 1 cm from the midline of the cervical spine, 3 cm inferior to the lower border of the occipital protuberance. The  second injection site was 1.5 cm superiorly and laterally to the first injection site.  -Trapezius muscle injection was performed at 3 sites, bilaterally. The first injection site was in the upper trapezius muscle halfway between the inflection point of the neck, and the acromion. The second injection site was one half way between the acromion and the first injection site. The third injection was done between the first injection site and the inflection point of the neck.   Will return for repeat injection in 3 months.   A total of 200 units of Botox was prepared, 155 units of Botox was injected as documented above, any Botox not injected was wasted. The patient tolerated the procedure well, there were no complications of the above procedure.

## 2024-02-12 ENCOUNTER — Ambulatory Visit (INDEPENDENT_AMBULATORY_CARE_PROVIDER_SITE_OTHER): Payer: Medicare Other | Admitting: Family Medicine

## 2024-02-12 VITALS — BP 161/97

## 2024-02-12 DIAGNOSIS — G43019 Migraine without aura, intractable, without status migrainosus: Secondary | ICD-10-CM

## 2024-02-12 MED ORDER — ONABOTULINUMTOXINA 100 UNITS IJ SOLR
155.0000 [IU] | Freq: Once | INTRAMUSCULAR | Status: AC
Start: 2024-02-12 — End: 2024-02-12
  Administered 2024-02-12: 155 [IU] via INTRAMUSCULAR

## 2024-02-12 MED ORDER — DICLOFENAC POTASSIUM 50 MG PO TABS: ORAL_TABLET | ORAL | 3 refills | Status: DC

## 2024-02-12 MED ORDER — RIZATRIPTAN BENZOATE 10 MG PO TABS: 10.0000 mg | ORAL_TABLET | ORAL | 3 refills | Status: AC | PRN

## 2024-02-12 NOTE — Progress Notes (Signed)
 Botox- 100 units x 2 vials Lot: [id number] Expiration: 2027/05 NDC: 8469-6295-28  Bacteriostatic 0.9% Sodium Chloride- 4 mL  Lot: [id number] Expiration: 2025/NOV/01 NDC: 0102725366  Dx: G43.701 .G43.019 B/B Witnessed by [employer] RN

## 2024-02-17 DIAGNOSIS — F3131 Bipolar disorder, current episode depressed, mild: Secondary | ICD-10-CM | POA: Diagnosis not present

## 2024-02-26 DIAGNOSIS — F3131 Bipolar disorder, current episode depressed, mild: Secondary | ICD-10-CM | POA: Diagnosis not present

## 2024-03-02 DIAGNOSIS — F3131 Bipolar disorder, current episode depressed, mild: Secondary | ICD-10-CM | POA: Diagnosis not present

## 2024-03-06 ENCOUNTER — Other Ambulatory Visit: Payer: Self-pay | Admitting: Family Medicine

## 2024-03-06 DIAGNOSIS — B009 Herpesviral infection, unspecified: Secondary | ICD-10-CM

## 2024-03-09 ENCOUNTER — Telehealth: Payer: Self-pay

## 2024-03-09 NOTE — Telephone Encounter (Signed)
 Requested Prescriptions  Pending Prescriptions Disp Refills   valACYclovir (VALTREX) 500 MG tablet [Pharmacy Med Name: VALACYCLOVIR 500MG  TABLETS] 30 tablet 2    Sig: TAKE 1 TABLET(500 MG) BY MOUTH DAILY AS NEEDED FOR BREAKOUTS     Antimicrobials:  Antiviral Agents - Anti-Herpetic Passed - 03/09/2024 10:35 AM      Passed - Valid encounter within last 12 months    Recent Outpatient Visits           3 months ago Chronic pain of right knee   [facility] [place], FNP   6 months ago Situational stress   [facility] Donita Brooks, MD   7 months ago Obesity (BMI 30-39.9)   [facility] [place], Priscille Heidelberg, MD   11 months ago Special screening for malignant neoplasms, colon    [facility] Pickard, Priscille Heidelberg, MD

## 2024-03-09 NOTE — Telephone Encounter (Signed)
 Copied from CRM [id number]. Topic: Clinical - Prescription Issue >> Mar 09, 2024 10:02 AM Shelah Lewandowsky wrote: Reason for CRM: valACYclovir (VALTREX) 500 MG table- pharmacy advised to contact office- call patient with any questions [phone]

## 2024-03-11 ENCOUNTER — Ambulatory Visit: Payer: Medicare Other | Admitting: Family Medicine

## 2024-03-12 DIAGNOSIS — F3131 Bipolar disorder, current episode depressed, mild: Secondary | ICD-10-CM | POA: Diagnosis not present

## 2024-03-17 DIAGNOSIS — F3131 Bipolar disorder, current episode depressed, mild: Secondary | ICD-10-CM | POA: Diagnosis not present

## 2024-03-23 DIAGNOSIS — F3131 Bipolar disorder, current episode depressed, mild: Secondary | ICD-10-CM | POA: Diagnosis not present

## 2024-04-01 DIAGNOSIS — M25511 Pain in right shoulder: Secondary | ICD-10-CM | POA: Diagnosis not present

## 2024-04-01 DIAGNOSIS — M25562 Pain in left knee: Secondary | ICD-10-CM | POA: Diagnosis not present

## 2024-04-01 DIAGNOSIS — M25551 Pain in right hip: Secondary | ICD-10-CM | POA: Diagnosis not present

## 2024-04-01 DIAGNOSIS — M25552 Pain in left hip: Secondary | ICD-10-CM | POA: Diagnosis not present

## 2024-04-02 DIAGNOSIS — F3131 Bipolar disorder, current episode depressed, mild: Secondary | ICD-10-CM | POA: Diagnosis not present

## 2024-04-08 DIAGNOSIS — F3131 Bipolar disorder, current episode depressed, mild: Secondary | ICD-10-CM | POA: Diagnosis not present

## 2024-04-19 DIAGNOSIS — Z961 Presence of intraocular lens: Secondary | ICD-10-CM | POA: Diagnosis not present

## 2024-04-19 DIAGNOSIS — H31091 Other chorioretinal scars, right eye: Secondary | ICD-10-CM | POA: Diagnosis not present

## 2024-04-21 ENCOUNTER — Ambulatory Visit: Payer: Medicare Other | Admitting: Family Medicine

## 2024-04-21 DIAGNOSIS — F319 Bipolar disorder, unspecified: Secondary | ICD-10-CM | POA: Diagnosis not present

## 2024-04-21 DIAGNOSIS — F3132 Bipolar disorder, current episode depressed, moderate: Secondary | ICD-10-CM | POA: Diagnosis not present

## 2024-04-21 DIAGNOSIS — F411 Generalized anxiety disorder: Secondary | ICD-10-CM | POA: Diagnosis not present

## 2024-04-23 DIAGNOSIS — F3131 Bipolar disorder, current episode depressed, mild: Secondary | ICD-10-CM | POA: Diagnosis not present

## 2024-04-26 ENCOUNTER — Telehealth: Payer: Self-pay | Admitting: Family Medicine

## 2024-04-26 NOTE — Telephone Encounter (Signed)
 LVM and sent mychart msg informing pt of need to reschedule 05/17/24 appt - NP out

## 2024-04-26 NOTE — Telephone Encounter (Signed)
 Pt has r/s her Botox  appointment and is on wait list

## 2024-04-28 DIAGNOSIS — F3131 Bipolar disorder, current episode depressed, mild: Secondary | ICD-10-CM | POA: Diagnosis not present

## 2024-05-04 DIAGNOSIS — F3131 Bipolar disorder, current episode depressed, mild: Secondary | ICD-10-CM | POA: Diagnosis not present

## 2024-05-10 ENCOUNTER — Ambulatory Visit: Admitting: Family Medicine

## 2024-05-17 ENCOUNTER — Ambulatory Visit: Admitting: Family Medicine

## 2024-05-19 DIAGNOSIS — F3131 Bipolar disorder, current episode depressed, mild: Secondary | ICD-10-CM | POA: Diagnosis not present

## 2024-05-21 DIAGNOSIS — F3131 Bipolar disorder, current episode depressed, mild: Secondary | ICD-10-CM | POA: Diagnosis not present

## 2024-05-24 ENCOUNTER — Other Ambulatory Visit (HOSPITAL_COMMUNITY): Payer: Self-pay | Admitting: Family Medicine

## 2024-05-24 DIAGNOSIS — Z1231 Encounter for screening mammogram for malignant neoplasm of breast: Secondary | ICD-10-CM

## 2024-05-26 DIAGNOSIS — F3131 Bipolar disorder, current episode depressed, mild: Secondary | ICD-10-CM | POA: Diagnosis not present

## 2024-06-03 ENCOUNTER — Encounter (HOSPITAL_COMMUNITY): Payer: Self-pay

## 2024-06-03 ENCOUNTER — Ambulatory Visit (HOSPITAL_COMMUNITY)
Admission: RE | Admit: 2024-06-03 | Discharge: 2024-06-03 | Disposition: A | Discharge status: Home / Self Care | Source: Ambulatory Visit | Attending: Family Medicine | Admitting: Family Medicine

## 2024-06-03 DIAGNOSIS — Z1231 Encounter for screening mammogram for malignant neoplasm of breast: Secondary | ICD-10-CM | POA: Insufficient documentation

## 2024-06-29 ENCOUNTER — Telehealth: Payer: Self-pay | Admitting: Family Medicine

## 2024-06-29 NOTE — Telephone Encounter (Signed)
 Pt has a new [employer] plan. Submitted auth request via CMM, status is pending. Key: [id number]

## 2024-06-30 NOTE — Telephone Encounter (Signed)
 Peggy Fitzgerald was approved. [employer] does not require a PA when they are the secondary insurance.  Auth#: [id number] (07/27/24-12/08/24)

## 2024-07-04 ENCOUNTER — Encounter: Payer: Self-pay | Admitting: Family Medicine

## 2024-07-05 ENCOUNTER — Other Ambulatory Visit: Payer: Self-pay

## 2024-07-06 ENCOUNTER — Other Ambulatory Visit: Payer: Self-pay | Admitting: Family Medicine

## 2024-07-06 MED ORDER — METOPROLOL SUCCINATE ER 100 MG PO TB24: 100.0000 mg | ORAL_TABLET | Freq: Every day | ORAL | 3 refills | Status: AC

## 2024-07-08 ENCOUNTER — Ambulatory Visit

## 2024-07-08 ENCOUNTER — Telehealth: Payer: Self-pay

## 2024-07-08 NOTE — Telephone Encounter (Signed)
 Copied from CRM [id number]. Topic: Clinical - Prescription Issue >> Jul 08, 2024  1:05 PM Delon T wrote: Reason for CRM: metoprolol  succinate (TOPROL -XL) 100 MG 24 hr tablet - need clarification on directions- [place] with [facility]- [phone]  ref- [id number]

## 2024-07-08 NOTE — Telephone Encounter (Signed)
 Spoke with [facility] and clarified directions for Metoprolol .

## 2024-07-27 ENCOUNTER — Ambulatory Visit (INDEPENDENT_AMBULATORY_CARE_PROVIDER_SITE_OTHER): Admitting: Family Medicine

## 2024-07-27 ENCOUNTER — Encounter: Payer: Self-pay | Admitting: Family Medicine

## 2024-07-27 VITALS — BP 100/72 | HR 60

## 2024-07-27 DIAGNOSIS — G43019 Migraine without aura, intractable, without status migrainosus: Secondary | ICD-10-CM

## 2024-07-27 MED ORDER — ONABOTULINUMTOXINA 200 UNITS IJ SOLR
155.0000 [IU] | Freq: Once | INTRAMUSCULAR | Status: AC
  Administered 2024-07-27: 155 [IU] via INTRAMUSCULAR

## 2024-07-27 NOTE — Progress Notes (Signed)
 07/27/24 ALL: Peggy Fitzgerald returns for Botox . Last procedure 02/2024. She had an appt with me 05/2024 but I had to reschedule. She was placed on wait list but reports never being contacted to move appt. [name] having daily headaches. [name] taking diclofenac  every day. She has not filled rizatriptan  recently as she did not know it was at [facility].   02/12/2024 ALL: Peggy Fitzgerald returns for Botox . Last procedure 10/2023. She reports doing well. She has had more headaches over the past month. Under more stress as father may have cancer. [name] using rizatriptan  and diclofenac  as needed and this works well for abortive therapy.   10/15/2023 ALL: Peggy Fitzgerald returns for Botox . She reports migraines seem better until next procedure is due. Rizatriptan  works well.   07/21/2023 ALL: Peggy Fitzgerald returns for Botox . Last procedure 04/2023. She feels headaches may have been better for a few weeks after last Botox  treatment but have worsened over the past month. She reduced dose of Excedrin. Only using a couple times a week. She has taken rizatriptan  more over the past month. Not drinking Energy drinks everyday but still drinks regularly. I have asked her to document migraines and treatment for the net 12 weeks.   04/22/2023 ALL: She returns for Botox . Last procedure 01/2023. She reports daily headaches continue. She may have 3-4 bad migraines per month. She continues Excedrin most every day. Usually 3-4 tablets of rizatriptan . [name] drinking C[employer] Drinks regularly. Maybe 16 ounces of water daily. Advised of rebound headaches and encouraged her to increase water and decrease caffeine .      Consent Form Botulism Toxin Injection For Chronic Migraine    Reviewed orally with patient, additionally signature is on file:  Botulism toxin has been approved by the Federal drug administration for treatment of chronic migraine. Botulism toxin does not cure chronic migraine and it may not be effective in some patients.  The  administration of botulism toxin is accomplished by injecting a small amount of toxin into the muscles of the neck and head. Dosage must be titrated for each individual. Any benefits resulting from botulism toxin tend to wear off after 3 months with a repeat injection required if benefit is to be maintained. Injections are usually done every 3-4 months with maximum effect peak achieved by about 2 or 3 weeks. Botulism toxin is expensive and you should be sure of what costs you will incur resulting from the injection.  The side effects of botulism toxin use for chronic migraine may include:   -Transient, and usually mild, facial weakness with facial injections  -Transient, and usually mild, head or neck weakness with head/neck injections  -Reduction or loss of forehead facial animation due to forehead muscle weakness  -Eyelid drooping  -Dry eye  -Pain at the site of injection or bruising at the site of injection  -Double vision  -Potential unknown long term risks   Contraindications: You should not have Botox  if you are pregnant, nursing, allergic to albumin, have an infection, skin condition, or muscle weakness at the site of the injection, or have myasthenia gravis, Lambert-Eaton syndrome, or ALS.  It is also possible that as with any injection, there may be an allergic reaction or no effect from the medication. Reduced effectiveness after repeated injections is sometimes seen and rarely infection at the injection site may occur. All care will be taken to prevent these side effects. If therapy is given over a long time, atrophy and wasting in the muscle injected may occur.  Occasionally the patient's become refractory to treatment because they develop antibodies to the toxin. In this event, therapy needs to be modified.  I have read the above information and consent to the administration of botulism toxin.    BOTOX  PROCEDURE NOTE FOR MIGRAINE HEADACHE  Contraindications and precautions  discussed with patient(above). Aseptic procedure was observed and patient tolerated procedure. Procedure performed by Greig Forbes, FNP-C.   The condition has existed for more than 6 months, and pt does not have a diagnosis of ALS, Myasthenia Gravis or Lambert-Eaton Syndrome.  Risks and benefits of injections discussed and pt agrees to proceed with the procedure.  Written consent obtained  These injections are medically necessary. Pt  receives good benefits from these injections. These injections do not cause sedations or hallucinations which the oral therapies may cause.   Description of procedure:  The patient was placed in a sitting position. The standard protocol was used for Botox  as follows, with 5 units of Botox  injected at each site:  -Procerus muscle, midline injection  -Corrugator muscle, bilateral injection  -Frontalis muscle, bilateral injection, with 2 sites each side, medial injection was performed in the upper one third of the frontalis muscle, in the region vertical from the medial inferior edge of the superior orbital rim. The lateral injection was again in the upper one third of the forehead vertically above the lateral limbus of the cornea, 1.5 cm lateral to the medial injection site.  -Temporalis muscle injection, 4 sites, bilaterally. The first injection was 3 cm above the tragus of the ear, second injection site was 1.5 cm to 3 cm up from the first injection site in line with the tragus of the ear. The third injection site was 1.5-3 cm forward between the first 2 injection sites. The fourth injection site was 1.5 cm posterior to the second injection site. 5th site laterally in the temporalis  muscleat the level of the outer canthus.  -Occipitalis muscle injection, 3 sites, bilaterally. The first injection was done one half way between the occipital protuberance and the tip of the mastoid process behind the ear. The second injection site was done lateral and superior to the  first, 1 fingerbreadth from the first injection. The third injection site was 1 fingerbreadth superiorly and medially from the first injection site.  -Cervical paraspinal muscle injection, 2 sites, bilaterally. The first injection site was 1 cm from the midline of the cervical spine, 3 cm inferior to the lower border of the occipital protuberance. The second injection site was 1.5 cm superiorly and laterally to the first injection site.  -Trapezius muscle injection was performed at 3 sites, bilaterally. The first injection site was in the upper trapezius muscle halfway between the inflection point of the neck, and the acromion. The second injection site was one half way between the acromion and the first injection site. The third injection was done between the first injection site and the inflection point of the neck.   Will return for repeat injection in 3 months.   A total of 200 units of Botox  was prepared, 155 units of Botox  was injected as documented above, any Botox  not injected was wasted. The patient tolerated the procedure well, there were no complications of the above procedure.

## 2024-07-27 NOTE — Progress Notes (Signed)
 Botox - 200 units x 1 vial Lot: [id number] Expiration: 10/2026 NDC: 9976-6078-97  Bacteriostatic 0.9% Sodium Chloride - 30 mL  Lot: [id number] Expiration: OCT-31-2026 NDC: 9590-8033-97  Dx:G43.019 B/B Witnessed by Briant Izell LATHER

## 2024-07-28 ENCOUNTER — Ambulatory Visit (INDEPENDENT_AMBULATORY_CARE_PROVIDER_SITE_OTHER)

## 2024-07-28 VITALS — Ht 66.0 in | Wt 192.0 lb

## 2024-07-28 DIAGNOSIS — Z0000 Encounter for general adult medical examination without abnormal findings: Secondary | ICD-10-CM | POA: Diagnosis not present

## 2024-07-28 NOTE — Patient Instructions (Addendum)
 Peggy Fitzgerald , Thank you for taking time out of your busy schedule to complete your Annual Wellness Visit with me. I enjoyed our conversation and look forward to speaking with you again next year. I, as well as your care team,  appreciate your ongoing commitment to your health goals. Please review the following plan we discussed and let me know if I can assist you in the future. Your Game plan/ To Do List     Follow up Visits: We will see or speak with you next year for your Next Medicare AWV with our clinical staff Have you seen your provider in the last 6 months (3 months if uncontrolled diabetes)? No  Clinician Recommendations:  Aim for 30 minutes of exercise or brisk walking, 6-8 glasses of water, and 5 servings of fruits and vegetables each day.       This is a list of the screenings recommended for you:  Health Maintenance  Topic Date Due   COVID-19 Vaccine (1) Never done   Pneumococcal Vaccine for age over 69 (2 of 2 - PCV) 03/19/2024   Flu Shot  07/09/2024   Medicare Annual Wellness Visit  07/28/2025   Mammogram  06/03/2026   Colon Cancer Screening  09/22/2033   DEXA scan (bone density measurement)  Completed   Hepatitis C Screening  Completed   Zoster (Shingles) Vaccine  Completed   HPV Vaccine  Aged Out   Meningitis B Vaccine  Aged Out   DTaP/Tdap/Td vaccine  Discontinued    Advanced directives: (ACP Link)Information on Advanced Care Planning can be found at [place]  [employer] [facility] Advance Health Care Directives Advance Health Care Directives. [email or web address]   Advance Care Planning is important because it:  [x]  Makes sure you receive the medical care that is consistent with your values, goals, and preferences  [x]  It provides guidance to your family and loved ones and reduces their decisional burden about whether or not they are making the right decisions based on your wishes.  Follow the link provided in your after visit summary or read over the paperwork we have  mailed to you to help you started getting your Advance Directives in place. If you need assistance in completing these, please reach out to us  so that we can help you!  See attachments for Preventive Care and Fall Prevention Tips.

## 2024-07-28 NOTE — Progress Notes (Signed)
 Subjective:   Peggy Fitzgerald is a 68 y.o. who presents for a Medicare Wellness preventive visit.  As a reminder, Annual Wellness Visits don't include a physical exam, and some assessments may be limited, especially if this visit is performed virtually. We may recommend an in-person follow-up visit with your provider if needed.  Visit Complete: Virtual I connected with  Peggy Fitzgerald Andrew on 07/28/24 by a audio enabled telemedicine application and verified that I am speaking with the correct person using two identifiers.  Patient Location: Home  Provider Location: Home Office  I discussed the limitations of evaluation and management by telemedicine. The patient expressed understanding and agreed to proceed.  Vital Signs: Because this visit was a virtual/telehealth visit, some criteria may be missing or patient reported. Any vitals not documented were not able to be obtained and vitals that have been documented are patient reported.  VideoDeclined- This patient declined Librarian, academic. Therefore the visit was completed with audio only.  Persons Participating in Visit: Patient.  AWV Questionnaire: No: Patient Medicare AWV questionnaire was not completed prior to this visit.  Cardiac Risk Factors include: advanced age (>24men, >42 women)     Objective:    Today's Vitals   07/28/24 1455  Weight: 192 lb (87.1 kg)  Height: 5' 6 (1.676 m)   Body mass index is 30.99 kg/m.     07/28/2024    2:56 PM 09/23/2023   12:53 PM 12/06/2022    3:14 PM 12/25/2021    4:05 PM 10/26/2021    3:02 PM 07/01/2017    1:47 PM 04/03/2017   11:41 AM  Advanced Directives  Does Patient Have a Medical Advance Directive? No No No Yes No No  No   Does patient want to make changes to medical advance directive?    No - Patient declined     Would patient like information on creating a medical advance directive? Yes (MAU/Ambulatory/Procedural Areas - Information given) No  - Patient declined   No - Patient declined       Data saved with a previous flowsheet row definition    Current Medications (verified) Outpatient Encounter Medications as of 07/28/2024  Medication Sig   acyclovir  ointment (ZOVIRAX ) 5 % Apply 1 Application topically 2 (two) times daily as needed (fever blisters).   albuterol  (VENTOLIN  HFA) 108 (90 Base) MCG/ACT inhaler Inhale 2 puffs into the lungs every 4 (four) hours as needed.   atorvastatin  (LIPITOR) 40 MG tablet Take 1 tablet (40 mg total) by mouth daily.   BIOTIN PO Take 25,000 mcg by mouth daily.   Boric Acid Vaginal 600 MG SUPP Place 600 mg vaginally at bedtime as needed (yeast infections).   CALCIUM  PO Take 1,000 mg by mouth daily.   Cholecalciferol (VITAMIN D3) 250 MCG (10000 UT) TABS Take 10,000 Units by mouth daily.   clobetasol  (TEMOVATE ) 0.05 % external solution APPLY 1 APPLICATION TOPICALLY TWICE A DAY AS NEEDED, APPLY TO SCALP AS NEEDED FOR IRRITATION   diclofenac  (CATAFLAM ) 50 MG tablet Take 50-100 mg at onset of migraine. Max dose 2 pills in 24 hours   diphenoxylate -atropine  (LOMOTIL ) 2.5-0.025 MG tablet Take 1 tablet by mouth 4 (four) times daily as needed for diarrhea or loose stools.   estradiol  (ESTRACE ) 0.1 MG/GM vaginal cream PLACE 1 GRAM VAGINALLY AT BEDTIME   fluticasone  (FLONASE ) 50 MCG/ACT nasal spray Place 2 sprays into both nostrils daily. (Patient taking differently: Place 2 sprays into both nostrils daily as needed for allergies.)  HYDROcodone -acetaminophen  (NORCO/VICODIN) 5-325 MG tablet Take 0.5 tablets by mouth 2 (two) times daily as needed for severe pain.   hydrOXYzine  (ATARAX /VISTARIL ) 25 MG tablet Take 1 tablet (25 mg total) by mouth 3 (three) times daily as needed. (Patient taking differently: Take 25 mg by mouth at bedtime.)   ketoconazole  (NIZORAL ) 2 % cream Apply 1 Application topically 2 (two) times daily as needed for irritation.   lamoTRIgine  (LAMICTAL ) 100 MG tablet Take 100 mg by mouth 2 (two)  times daily.   LATUDA  80 MG TABS tablet Take 80 mg by mouth at bedtime.   LORazepam  (ATIVAN ) 0.5 MG tablet Take 1 tablet (0.5 mg total) by mouth every 8 (eight) hours as needed for anxiety (itching).   lurasidone  (LATUDA ) 20 MG TABS tablet Take 20 mg by mouth every morning.   meclizine  (ANTIVERT ) 25 MG tablet TAKE 1 TABLET AS NEEDED FOR DIZZINESS   methocarbamol  (ROBAXIN ) 500 MG tablet Take 1 tablet (500 mg total) by mouth 4 (four) times daily.   metoprolol  succinate (TOPROL -XL) 100 MG 24 hr tablet Take 1 tablet (100 mg total) by mouth daily. TAKE 1 TABLET AT BEDTIME WITH OR IMMEDIATELY FOLLOWING A MEAL  (CALL CLINIC TO SCHEDULE APPOINTMENT FOR FUTURE REFILLS. [phone])   montelukast  (SINGULAIR ) 10 MG tablet TAKE 1 TABLET(10 MG) BY MOUTH AT BEDTIME   ondansetron  (ZOFRAN -ODT) 4 MG disintegrating tablet Take 1 tablet (4 mg total) by mouth every 8 (eight) hours as needed for nausea or vomiting.   promethazine  (PHENERGAN ) 25 MG tablet TAKE 1 TABLET EVERY 6 HOURS AS NEEDED FOR NAUSEA OR VOMITING   promethazine -dextromethorphan (PROMETHAZINE -DM) 6.25-15 MG/5ML syrup Take 5 mLs by mouth 4 (four) times daily as needed.   rizatriptan  (MAXALT ) 10 MG tablet Take 1 tablet (10 mg total) by mouth as needed for migraine. May repeat in 2 hours if needed   terconazole  (TERAZOL 7 ) 0.4 % vaginal cream Place 1 applicator vaginally at bedtime. (Patient taking differently: Place 1 applicator vaginally at bedtime as needed (yeast infections).)   valACYclovir  (VALTREX ) 500 MG tablet TAKE 1 TABLET(500 MG) BY MOUTH DAILY AS NEEDED FOR BREAKOUTS   zolpidem  (AMBIEN  CR) 6.25 MG CR tablet Take 6.25 mg by mouth at bedtime.   No facility-administered encounter medications on file as of 07/28/2024.    Allergies (verified) Hydrocodone -acetaminophen , Milk-related compounds, and Oxycodone-acetaminophen    History: Past Medical History:  Diagnosis Date   AC (acromioclavicular) joint bone spurs, right    Allergy    seasonal    Anxiety    Asthma    asthmatic bronchitis   Bipolar 1 disorder, depressed (HCC)    Candida infection    Cataract 2007   Depression    Genital herpes    GERD (gastroesophageal reflux disease)    Headache    Herpes simplex type 2 infection    High cholesterol    Hyperlipidemia    Hypertension    Migraine    Osteopenia    Plantar fasciitis of right foot    Prediabetes    Rotator cuff tear    right shoulder   Past Surgical History:  Procedure Laterality Date   CATARACT EXTRACTION, BILATERAL     CHOLECYSTECTOMY  1987   COLONOSCOPY WITH PROPOFOL  N/A 09/23/2023   Procedure: COLONOSCOPY WITH PROPOFOL ;  Surgeon: Cinderella Deatrice FALCON, MD;  Location: [facility];  Service: Endoscopy;  Laterality: N/A;  2:00 PM,ASA 2   KNEE SURGERY Right    Family History  Problem Relation Age of Onset   Arthritis Mother  Asthma Mother    Hearing loss Mother    Hyperlipidemia Mother    Hypertension Mother    Miscarriages / [place] Mother    Stroke Mother    Hearing loss Father    Hypertension Father    Stroke Father    Diabetes Father    Bell's palsy Father    Heart disease Father    Kidney disease Father    Cancer Brother        leukemia   Early death Brother    Arthritis Maternal Grandmother    Depression Maternal Grandmother    Diabetes Maternal Grandmother    Heart disease Maternal Grandmother        CHF   Miscarriages / Stillbirths Maternal Grandmother    Vision loss Maternal Grandmother    Alcohol abuse Maternal Grandfather    Heart attack Maternal Grandfather    Arthritis Paternal Grandmother    Heart disease Paternal Grandmother        massive heart attack   Miscarriages / Stillbirths Paternal Grandmother    Alcohol abuse Paternal Grandfather    Stroke Paternal Grandfather    Heart disease Paternal Grandfather        arteriosclerosis   Social History   Socioeconomic History   Marital status: Married    Spouse name: Dempsey   Number of children: 3   Years of  education: Not on file   Highest education level: Not on file  Occupational History   Not on file  Tobacco Use   Smoking status: Former    Types: Cigarettes   Smokeless tobacco: Never   Tobacco comments:    Quit many years ago  Vaping Use   Vaping status: Never Used  Substance and Sexual Activity   Alcohol use: No   Drug use: No   Sexual activity: Yes    Birth control/protection: None    Comment: spouse vasectomy  Other Topics Concern   Not on file  Social History Narrative   Lives with husband.    14 grandchildren and 1 expected in 2023.   Caffeine - tea 1 glass   Social Drivers of Corporate investment banker Strain: Low Risk  (07/28/2024)   Overall Financial Resource Strain (CARDIA)    Difficulty of Paying Living Expenses: Not very hard  Food Insecurity: No Food Insecurity (07/28/2024)   Hunger Vital Sign    Worried About Running Out of Food in the Last Year: Never true    Ran Out of Food in the Last Year: Never true  Transportation Needs: No Transportation Needs (07/28/2024)   PRAPARE - Administrator, Civil Service (Medical): No    Lack of Transportation (Non-Medical): No  Physical Activity: Inactive (07/28/2024)   Exercise Vital Sign    Days of Exercise per Week: 0 days    Minutes of Exercise per Session: 0 min  Stress: Stress Concern Present (07/28/2024)   [employer] of Occupational Health - Occupational Stress Questionnaire    Feeling of Stress: To some extent  Social Connections: Moderately Integrated (07/28/2024)   Social Connection and Isolation Panel    Frequency of Communication with Friends and Family: Three times a week    Frequency of Social Gatherings with Friends and Family: Three times a week    Attends Religious Services: 1 to 4 times per year    Active Member of Clubs or Organizations: No    Attends Banker Meetings: Never    Marital Status: Married    Tobacco  Counseling Counseling given: Not Answered Tobacco  comments: Quit many years ago    Clinical Intake:  Pre-visit preparation completed: Yes  Pain : No/denies pain  Diabetes: No  Lab Results  Component Value Date   HGBA1C 5.6 06/16/2017   HGBA1C 5.7 (H) 12/18/2015     How often do you need to have someone help you when you read instructions, pamphlets, or other written materials from your doctor or pharmacy?: 1 - Never  Interpreter Needed?: No  Information entered by :: Charmaine Bloodgood LPN   Activities of Daily Living     07/28/2024    2:56 PM  In your present state of health, do you have any difficulty performing the following activities:  Hearing? 0  Vision? 0  Difficulty concentrating or making decisions? 0  Walking or climbing stairs? 0  Dressing or bathing? 0  Doing errands, shopping? 0  Preparing Food and eating ? N  Using the Toilet? N  In the past six months, have you accidently leaked urine? N  Do you have problems with loss of bowel control? N  Managing your Medications? N  Managing your Finances? N  Housekeeping or managing your Housekeeping? N    Patient Care Team: Duanne Butler DASEN, MD as PCP - General (Family Medicine) Cary No, NP as Nurse Practitioner (Family Medicine) Jayne Vonn DEL, MD as Consulting Physician (Obstetrics and Gynecology) Joshua Pao, NP (Psychiatry) Elspeth Lauraine DEL, OD as Referring Physician (Optometry)  I have updated your Care Teams any recent Medical Services you may have received from other providers in the past year.     Assessment:   This is a routine wellness examination for Peggy Fitzgerald.  Hearing/Vision screen Hearing Screening - Comments:: Denies hearing difficulties   Vision Screening - Comments:: Wears rx glasses - up to date with routine eye exams with Dr. Lauraine Elspeth    Goals Addressed             This Visit's Progress    Maintain health and independence   On track      Depression Screen     07/28/2024    2:59 PM 11/12/2023   10:39 AM 08/08/2023    12:28 PM 03/20/2023   10:34 AM 12/06/2022    2:55 PM 10/26/2021    2:55 PM 09/07/2021   12:11 PM  PHQ 2/9 Scores  PHQ - 2 Score    0 0 0 0  Exception Documentation Patient refusal Other- indicate reason in comment box Patient refusal      Not completed  Already diagnosed with biploar depression- refused to fill out PHQ-9         Fall Risk     07/28/2024    2:56 PM 11/12/2023   10:39 AM 08/08/2023   12:28 PM 03/20/2023   10:33 AM 12/06/2022    2:55 PM  Fall Risk   Falls in the past year? 0 0 0 0 0  Number falls in past yr: 0 0  0   Injury with Fall? 0 0  0   Risk for fall due to : No Fall Risks   No Fall Risks   Follow up Falls prevention discussed;Education provided;Falls evaluation completed   Falls prevention discussed     MEDICARE RISK AT HOME:  Medicare Risk at Home Any stairs in or around the home?: No If so, are there any without handrails?: No Home free of loose throw rugs in walkways, pet beds, electrical cords, etc?: Yes Adequate lighting in your home  to reduce risk of falls?: Yes Life alert?: No Use of a cane, walker or w/c?: No Grab bars in the bathroom?: Yes Shower chair or bench in shower?: No Elevated toilet seat or a handicapped toilet?: No  TIMED UP AND GO:  Was the test performed?  No  Cognitive Function: Declined/Normal: No cognitive concerns noted by patient or family. Patient alert, oriented, able to answer questions appropriately and recall recent events. No signs of memory loss or confusion.        12/06/2022    3:09 PM 10/26/2021    3:06 PM  6CIT Screen  What Year? 0 points 0 points  What month? 0 points 0 points  What time? 0 points 0 points  Count back from 20 0 points 0 points  Months in reverse 0 points 0 points  Repeat phrase 0 points 2 points  Total Score 0 points 2 points    Immunizations Immunization History  Administered Date(s) Administered   Fluad Trivalent(High Dose 65+) 11/12/2023   Influenza,inj,Quad PF,6+ Mos 08/27/2018    Influenza,inj,quad, With Preservative 10/18/2021   Pneumococcal Polysaccharide-23 03/20/2023   Pneumococcal-Unspecified 10/24/2019   Zoster Recombinant(Shingrix) 08/27/2018, 02/10/2019    Screening Tests Health Maintenance  Topic Date Due   COVID-19 Vaccine (1) Never done   Pneumococcal Vaccine: 50+ Years (2 of 2 - PCV) 03/19/2024   INFLUENZA VACCINE  07/09/2024   Medicare Annual Wellness (AWV)  07/28/2025   MAMMOGRAM  06/03/2026   Colonoscopy  09/22/2033   DEXA SCAN  Completed   Hepatitis C Screening  Completed   Zoster Vaccines- Shingrix  Completed   HPV VACCINES  Aged Out   Meningococcal B Vaccine  Aged Out   DTaP/Tdap/Td  Discontinued    Health Maintenance  Health Maintenance Due  Topic Date Due   COVID-19 Vaccine (1) Never done   Pneumococcal Vaccine: 50+ Years (2 of 2 - PCV) 03/19/2024   INFLUENZA VACCINE  07/09/2024    Additional Screening:  Vision Screening: Recommended annual ophthalmology exams for early detection of glaucoma and other disorders of the eye. Would you like a referral to an eye doctor? No    Dental Screening: Recommended annual dental exams for proper oral hygiene  Community Resource Referral / Chronic Care Management: CRR required this visit?  No   CCM required this visit?  No   Plan:    I have personally reviewed and noted the following in the patient's chart:   Medical and social history Use of alcohol, tobacco or illicit drugs  Current medications and supplements including opioid prescriptions. Patient is currently taking opioid prescriptions. Information provided to patient regarding non-opioid alternatives. Patient advised to discuss non-opioid treatment plan with their provider. Functional ability and status Nutritional status Physical activity Advanced directives List of other physicians Hospitalizations, surgeries, and ER visits in previous 12 months Vitals Screenings to include cognitive, depression, and falls Referrals  and appointments  In addition, I have reviewed and discussed with patient certain preventive protocols, quality metrics, and best practice recommendations. A written personalized care plan for preventive services as well as general preventive health recommendations were provided to patient.   Lavelle Pfeiffer [place], [place]   [id number]/[id number]   After Visit Summary: (MyChart) Due to this being a telephonic visit, the after visit summary with patients personalized plan was offered to patient via MyChart   Notes: PCP Follow Up Recommendations: Patient declined to schedule office visit at this time; but states that she will call back

## 2024-07-29 ENCOUNTER — Ambulatory Visit

## 2024-08-25 ENCOUNTER — Encounter: Payer: Self-pay | Admitting: Family Medicine

## 2024-08-25 ENCOUNTER — Ambulatory Visit: Payer: Self-pay

## 2024-08-25 ENCOUNTER — Ambulatory Visit (INDEPENDENT_AMBULATORY_CARE_PROVIDER_SITE_OTHER): Admitting: Family Medicine

## 2024-08-25 ENCOUNTER — Ambulatory Visit: Admitting: Family Medicine

## 2024-08-25 VITALS — BP 138/88 | HR 85 | Temp 97.9°F | Ht 66.0 in | Wt 189.4 lb

## 2024-08-25 DIAGNOSIS — N760 Acute vaginitis: Secondary | ICD-10-CM | POA: Diagnosis not present

## 2024-08-25 DIAGNOSIS — Z23 Encounter for immunization: Secondary | ICD-10-CM

## 2024-08-25 DIAGNOSIS — B9689 Other specified bacterial agents as the cause of diseases classified elsewhere: Secondary | ICD-10-CM | POA: Diagnosis not present

## 2024-08-25 LAB — WET PREP FOR TRICH, YEAST, CLUE: Source:: 2

## 2024-08-25 MED ORDER — METRONIDAZOLE 500 MG PO TABS
500.0000 mg | ORAL_TABLET | Freq: Two times a day (BID) | ORAL | 0 refills | Status: AC
Start: 2024-08-25 — End: 2024-09-01

## 2024-08-25 NOTE — Addendum Note (Signed)
 Addended by: CORINNA BRISKER R on: 08/25/2024 02:42 PM   Modules accepted: Orders

## 2024-08-25 NOTE — Addendum Note (Signed)
 Addended by: CORINNA BRISKER R on: 08/25/2024 12:21 PM   Modules accepted: Orders

## 2024-08-25 NOTE — Progress Notes (Signed)
 Subjective:  HPI: Peggy Fitzgerald is a 68 y.o. female presenting on 08/25/2024 for Acute Visit (Vaginal itching  and pain and yeast in other areas recently x 1 week./Interested in flu shot today )   HPI Patient is in today for vaginal itching and burning for 1 week. Denies vaginal discharge however she is using estrace . Does not douche or use harsh soaps or detergents. Has been avoiding intercourse for 1 week since symptoms onset. No recent antibiotics. Denies fever, chills, night sweats, dysuria. Advised on sexual partner treatment and she declined. She would also like her flu shot today.   Review of Systems  All other systems reviewed and are negative.   Relevant past medical history reviewed and updated as indicated.   Past Medical History:  Diagnosis Date   AC (acromioclavicular) joint bone spurs, right    Allergy    seasonal   Anxiety    Asthma    asthmatic bronchitis   Bipolar 1 disorder, depressed (HCC)    Candida infection    Cataract 2007   Depression    Genital herpes    GERD (gastroesophageal reflux disease)    Headache    Herpes simplex type 2 infection    High cholesterol    Hyperlipidemia    Hypertension    Migraine    Osteopenia    Plantar fasciitis of right foot    Prediabetes    Rotator cuff tear    right shoulder     Past Surgical History:  Procedure Laterality Date   CATARACT EXTRACTION, BILATERAL     CHOLECYSTECTOMY  1987   COLONOSCOPY WITH PROPOFOL  N/A 09/23/2023   Procedure: COLONOSCOPY WITH PROPOFOL ;  Surgeon: Cinderella Deatrice FALCON, MD;  Location: [facility];  Service: Endoscopy;  Laterality: N/A;  2:00 PM,ASA 2   KNEE SURGERY Right     Allergies and medications reviewed and updated.   Current Outpatient Medications:    acyclovir  ointment (ZOVIRAX ) 5 %, Apply 1 Application topically 2 (two) times daily as needed (fever blisters)., Disp: , Rfl:    albuterol  (VENTOLIN  HFA) 108 (90 Base) MCG/ACT inhaler, Inhale 2 puffs into the lungs  every 4 (four) hours as needed., Disp: 18 g, Rfl: 0   atorvastatin  (LIPITOR) 40 MG tablet, Take 1 tablet (40 mg total) by mouth daily., Disp: 90 tablet, Rfl: 3   BIOTIN PO, Take 25,000 mcg by mouth daily., Disp: , Rfl:    Boric Acid Vaginal 600 MG SUPP, Place 600 mg vaginally at bedtime as needed (yeast infections)., Disp: , Rfl:    CALCIUM  PO, Take 1,000 mg by mouth daily., Disp: , Rfl:    Cholecalciferol (VITAMIN D3) 250 MCG (10000 UT) TABS, Take 10,000 Units by mouth daily., Disp: , Rfl:    clobetasol  (TEMOVATE ) 0.05 % external solution, APPLY 1 APPLICATION TOPICALLY TWICE A DAY AS NEEDED, APPLY TO SCALP AS NEEDED FOR IRRITATION, Disp: 150 mL, Rfl: 3   diclofenac  (CATAFLAM ) 50 MG tablet, Take 50-100 mg at onset of migraine. Max dose 2 pills in 24 hours, Disp: 45 tablet, Rfl: 3   diphenoxylate -atropine  (LOMOTIL ) 2.5-0.025 MG tablet, Take 1 tablet by mouth 4 (four) times daily as needed for diarrhea or loose stools., Disp: 30 tablet, Rfl: 0   estradiol  (ESTRACE ) 0.1 MG/GM vaginal cream, PLACE 1 GRAM VAGINALLY AT BEDTIME, Disp: 42.5 g, Rfl: 7   fluticasone  (FLONASE ) 50 MCG/ACT nasal spray, Place 2 sprays into both nostrils daily. (Patient taking differently: Place 2 sprays into both nostrils daily as  needed for allergies.), Disp: 16 g, Rfl: 6   HYDROcodone -acetaminophen  (NORCO/VICODIN) 5-325 MG tablet, Take 0.5 tablets by mouth 2 (two) times daily as needed for severe pain., Disp: , Rfl:    hydrOXYzine  (ATARAX /VISTARIL ) 25 MG tablet, Take 1 tablet (25 mg total) by mouth 3 (three) times daily as needed. (Patient taking differently: Take 25 mg by mouth at bedtime.), Disp: , Rfl:    ketoconazole  (NIZORAL ) 2 % cream, Apply 1 Application topically 2 (two) times daily as needed for irritation., Disp: 60 g, Rfl: 1   lamoTRIgine  (LAMICTAL ) 100 MG tablet, Take 100 mg by mouth 2 (two) times daily., Disp: , Rfl:    LATUDA  80 MG TABS tablet, Take 80 mg by mouth at bedtime. (Patient taking differently: Take 80 mg  by mouth in the morning.), Disp: , Rfl:    LORazepam  (ATIVAN ) 0.5 MG tablet, Take 1 tablet (0.5 mg total) by mouth every 8 (eight) hours as needed for anxiety (itching)., Disp: 30 tablet, Rfl: 0   lurasidone  (LATUDA ) 20 MG TABS tablet, Take 20 mg by mouth every morning. (Patient taking differently: Take 40 mg by mouth at bedtime.), Disp: , Rfl:    meclizine  (ANTIVERT ) 25 MG tablet, TAKE 1 TABLET AS NEEDED FOR DIZZINESS, Disp: 90 tablet, Rfl: 3   methocarbamol  (ROBAXIN ) 500 MG tablet, Take 1 tablet (500 mg total) by mouth 4 (four) times daily., Disp: 90 tablet, Rfl: 3   metoprolol  succinate (TOPROL -XL) 100 MG 24 hr tablet, Take 1 tablet (100 mg total) by mouth daily. TAKE 1 TABLET AT BEDTIME WITH OR IMMEDIATELY FOLLOWING A MEAL  (CALL CLINIC TO SCHEDULE APPOINTMENT FOR FUTURE REFILLS. [phone]), Disp: 90 tablet, Rfl: 3   metroNIDAZOLE  (FLAGYL ) 500 MG tablet, Take 1 tablet (500 mg total) by mouth 2 (two) times daily for 7 days., Disp: 14 tablet, Rfl: 0   montelukast  (SINGULAIR ) 10 MG tablet, TAKE 1 TABLET(10 MG) BY MOUTH AT BEDTIME, Disp: 90 tablet, Rfl: 1   ondansetron  (ZOFRAN -ODT) 4 MG disintegrating tablet, Take 1 tablet (4 mg total) by mouth every 8 (eight) hours as needed for nausea or vomiting., Disp: 20 tablet, Rfl: 0   promethazine  (PHENERGAN ) 25 MG tablet, TAKE 1 TABLET EVERY 6 HOURS AS NEEDED FOR NAUSEA OR VOMITING, Disp: 90 tablet, Rfl: 3   rizatriptan  (MAXALT ) 10 MG tablet, Take 1 tablet (10 mg total) by mouth as needed for migraine. May repeat in 2 hours if needed, Disp: 30 tablet, Rfl: 3   terconazole  (TERAZOL 7 ) 0.4 % vaginal cream, Place 1 applicator vaginally at bedtime. (Patient taking differently: Place 1 applicator vaginally at bedtime as needed (yeast infections).), Disp: 45 g, Rfl: 6   valACYclovir  (VALTREX ) 500 MG tablet, TAKE 1 TABLET(500 MG) BY MOUTH DAILY AS NEEDED FOR BREAKOUTS, Disp: 30 tablet, Rfl: 2   zolpidem  (AMBIEN  CR) 6.25 MG CR tablet, Take 6.25 mg by mouth at  bedtime., Disp: , Rfl:   Allergies  Allergen Reactions   Hydrocodone -Acetaminophen  Other (See Comments)    Headaches on long term use   Milk-Related Compounds Diarrhea    Cannot drink plain milk, but can eat ice cream, and milk in cereal   Oxycodone-Acetaminophen  Itching    Nightmares    Objective:   BP 138/88   Pulse 85   Temp 97.9 F (36.6 C)   Ht 5' 6 (1.676 m)   Wt 189 lb 6.4 oz (85.9 kg)   SpO2 98%   BMI 30.57 kg/m      08/25/2024   11:19 AM  07/28/2024    2:55 PM 07/27/2024   10:57 AM  Vitals with BMI  Height 5' 6 5' 6   Weight 189 lbs 6 oz 192 lbs   BMI 30.58 31   Systolic 138 -- 100  Diastolic 88 -- 72  Pulse 85  60     Physical Exam Vitals and nursing note reviewed.  Constitutional:      Appearance: Normal appearance. [name] normal weight.  HENT:     Head: Normocephalic and atraumatic.  Skin:    General: Skin is warm and dry.  Neurological:     General: No focal deficit present.     Mental Status: [name] alert and oriented to person, place, and time. Mental status is at baseline.  Psychiatric:        Mood and Affect: Mood normal.        Behavior: Behavior normal.        Thought Content: Thought content normal.        Judgment: Judgment normal.     Assessment & Plan:  Bacterial vaginosis Assessment & Plan: Microscopic wet-mount exam shows clue cells, excessive bacteria. Start Flagyl  500mg  BID x7 days. Counseled on transmission and recommended treatment of sexual partner. Follow up PRN if symptoms persist or worsen.    Other orders -     metroNIDAZOLE ; Take 1 tablet (500 mg total) by mouth 2 (two) times daily for 7 days.  Dispense: 14 tablet; Refill: 0     Follow up plan: Return if symptoms worsen or fail to improve.  Jeoffrey GORMAN Barrio, FNP

## 2024-08-25 NOTE — Assessment & Plan Note (Addendum)
 Microscopic wet-mount exam shows clue cells, excessive bacteria. Start Flagyl  500mg  BID x7 days. Counseled on transmission and recommended treatment of sexual partner. Follow up PRN if symptoms persist or worsen.

## 2024-08-25 NOTE — Addendum Note (Signed)
 Addended by: KAYLA JEOFFREY RAMAN on: 08/25/2024 02:20 PM   Modules accepted: Orders

## 2024-08-25 NOTE — Telephone Encounter (Signed)
 FYI Only or Action Required?: FYI only for provider.-requested same day appointment-scheduled with another provider in office.   Peggy Fitzgerald was last seen in primary care on 11/12/2023 by Kayla Jeoffrey RAMAN, FNP.  Called Nurse Triage reporting Vaginal Pain and Vaginal Itching.  Symptoms began a week ago.  Interventions attempted: OTC medications: Monistat and Rest, hydration, or home remedies.  Symptoms are: unchanged.  Triage Disposition: See Physician Within 24 Hours  Peggy Fitzgerald/caregiver understands and will follow disposition?: Yes  Copied from CRM #[id number]. Topic: Clinical - Red Word Triage >> Aug 25, 2024  8:29 AM Tobias L wrote: Red Word that prompted transfer to Nurse Triage: Possible yeast infection, itching & burning on the outside of vaginal area, in pain Reason for Disposition  MODERATE-SEVERE itching (i.e., interferes with school, work, or sleep)  Answer Assessment - Initial Assessment Questions 1. SYMPTOM: What's the main symptom you're concerned about? (e.g., pain, itching, dryness)     Burning and itching 2. LOCATION: Where is the  burning/itching located? (e.g., inside/outside, left/right)     Outside of vaginal area 3. ONSET: When did the  burning/itching  start?     Started a week ago 4. PAIN: Is there any pain? If Yes, ask: How bad is it? (Scale: 1-10; mild, moderate, severe)     Pain level 1 out of 10 5. ITCHING: Is there any itching? If Yes, ask: How bad is it? (Scale: 1-10; mild, moderate, severe)     Yes-moderate itching 6. CAUSE: What do you think is causing the discharge? Have you had the same problem before? What happened then?     No discharge currently 7. OTHER SYMPTOMS: Do you have any other symptoms? (e.g., fever, itching, vaginal bleeding, pain with urination, injury to genital area, vaginal foreign body)     No  Peggy Fitzgerald reports trying Monistat at home but had no improvement in symptoms.  Protocols used: Vaginal Symptoms-A-AH

## 2024-09-01 ENCOUNTER — Other Ambulatory Visit: Payer: Self-pay | Admitting: Family Medicine

## 2024-09-01 DIAGNOSIS — R42 Dizziness and giddiness: Secondary | ICD-10-CM

## 2024-09-01 DIAGNOSIS — G43009 Migraine without aura, not intractable, without status migrainosus: Secondary | ICD-10-CM

## 2024-09-15 ENCOUNTER — Encounter: Payer: Self-pay | Admitting: Family Medicine

## 2024-09-16 ENCOUNTER — Other Ambulatory Visit: Payer: Self-pay | Admitting: Family Medicine

## 2024-09-16 MED ORDER — ONDANSETRON HCL 4 MG PO TABS: 4.0000 mg | ORAL_TABLET | Freq: Three times a day (TID) | ORAL | 0 refills | Status: AC | PRN

## 2024-09-17 ENCOUNTER — Other Ambulatory Visit: Payer: Self-pay | Admitting: Family Medicine

## 2024-09-22 NOTE — Telephone Encounter (Signed)
 Submitted auth request to change to SP, status is pending. Key: [id number]

## 2024-09-22 NOTE — Telephone Encounter (Signed)
 Received a fax that Peggy Fitzgerald was denied, her [place] does not allow the use of a SP.

## 2024-10-01 ENCOUNTER — Other Ambulatory Visit: Payer: Self-pay | Admitting: Obstetrics & Gynecology

## 2024-10-01 ENCOUNTER — Other Ambulatory Visit: Payer: Self-pay | Admitting: Family Medicine

## 2024-10-01 DIAGNOSIS — B009 Herpesviral infection, unspecified: Secondary | ICD-10-CM

## 2024-10-06 ENCOUNTER — Ambulatory Visit
Admission: RE | Admit: 2024-10-06 | Discharge: 2024-10-06 | Disposition: A | Discharge status: Home / Self Care | Payer: Self-pay | Source: Ambulatory Visit | Attending: Nurse Practitioner | Admitting: Nurse Practitioner

## 2024-10-06 VITALS — BP 124/79 | HR 63 | Temp 97.8°F | Resp 20

## 2024-10-06 DIAGNOSIS — B372 Candidiasis of skin and nail: Secondary | ICD-10-CM | POA: Diagnosis not present

## 2024-10-06 MED ORDER — KETOCONAZOLE 2 % EX CREA: 1.0000 | TOPICAL_CREAM | Freq: Two times a day (BID) | CUTANEOUS | 0 refills | Status: AC | PRN

## 2024-10-06 MED ORDER — FLUCONAZOLE 150 MG PO TABS: 150.0000 mg | ORAL_TABLET | Freq: Once | ORAL | 0 refills | Status: AC

## 2024-10-06 NOTE — ED Provider Notes (Signed)
 [facility]    CSN: [id number] Arrival date & time: 10/06/24  1132      History   Chief Complaint No chief complaint on file.   HPI Peggy Fitzgerald is a 68 y.o. female.   Patient presents today for red, itchy, painful rash in groin.  Reports she was diagnosed with intertrigo in 2023 under her breasts and abdominal folds and this has fully resolved with the medication she was prescribed at that time.  Reports it has returned in her groin now.  No fevers or nausea/vomiting.  She has been using previously prescribed ketoconazole  cream with some improvement but reports the rash is still very painful.  No drainage or oozing or odor from the rash.    Past Medical History:  Diagnosis Date   AC (acromioclavicular) joint bone spurs, right    Allergy    seasonal   Anxiety    Asthma    asthmatic bronchitis   Bipolar 1 disorder, depressed (HCC)    Candida infection    Cataract 2007   Depression    Genital herpes    GERD (gastroesophageal reflux disease)    Headache    Herpes simplex type 2 infection    High cholesterol    Hyperlipidemia    Hypertension    Migraine    Osteopenia    Plantar fasciitis of right foot    Prediabetes    Rotator cuff tear    right shoulder    Patient Active Problem List   Diagnosis Date Noted   Bacterial vaginosis 08/25/2024   Positive colorectal cancer screening using Cologuard test 09/23/2023   Grade II hemorrhoids 09/23/2023   Diverticulosis of colon without hemorrhage 09/23/2023   Right knee pain 10/18/2021   Obesity (BMI 30-39.9) 08/23/2021   Migraine 05/21/2018   Low back pain without sciatica 07/01/2017   Urinary tract infection without hematuria 07/01/2017   Vaginal dryness 07/01/2017   Bipolar 1 disorder, mixed, moderate (HCC) 08/28/2016   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   Insomnia 12/18/2015   Headache 12/18/2015   Vertigo 12/18/2015   Allergy    Anxiety    Asthma    Depression    GERD (gastroesophageal  reflux disease)    Hyperlipidemia    Hypertension    Herpes simplex type 2 infection    Chronic left shoulder pain 04/24/2009   IMPINGEMENT SYNDROME 04/24/2009    Past Surgical History:  Procedure Laterality Date   CATARACT EXTRACTION, BILATERAL     CHOLECYSTECTOMY  1987   COLONOSCOPY WITH PROPOFOL  N/A 09/23/2023   Procedure: COLONOSCOPY WITH PROPOFOL ;  Surgeon: Cinderella Deatrice FALCON, MD;  Location: [facility];  Service: Endoscopy;  Laterality: N/A;  2:00 PM,ASA 2   KNEE SURGERY Right     OB History     Gravida  3   Para  3   Term  3   Preterm      AB      Living  3      SAB      IAB      Ectopic      Multiple      Live Births  3            Home Medications    Prior to Admission medications   Medication Sig Start Date End Date Taking? Authorizing Provider  acyclovir  ointment (ZOVIRAX ) 5 % Apply 1 Application topically 2 (two) times daily as needed (fever blisters).    [provider]  albuterol  (  VENTOLIN  HFA) 108 (90 Base) MCG/ACT inhaler Inhale 2 puffs into the lungs every 4 (four) hours as needed. 08/20/23   Stuart Vernell Norris, PA-C  atorvastatin  (LIPITOR) 40 MG tablet Take 1 tablet (40 mg total) by mouth daily. 10/06/23   Duanne Butler DASEN, MD  BIOTIN PO Take 25,000 mcg by mouth daily.    [provider]  Boric Acid Vaginal 600 MG SUPP Place 600 mg vaginally at bedtime as needed (yeast infections).    [provider]  CALCIUM  PO Take 1,000 mg by mouth daily.    [provider]  Cholecalciferol (VITAMIN D3) 250 MCG (10000 UT) TABS Take 10,000 Units by mouth daily.    [provider]  clobetasol  (TEMOVATE ) 0.05 % external solution APPLY 1 APPLICATION TOPICALLY TWICE A DAY AS NEEDED, APPLY TO SCALP AS NEEDED FOR IRRITATION 02/14/22   Duanne Butler DASEN, MD  diclofenac  (CATAFLAM ) 50 MG tablet Take 50-100 mg at onset of migraine. Max dose 2 pills in 24 hours 02/12/24   Lomax, Amy, NP  diphenoxylate -atropine   (LOMOTIL ) 2.5-0.025 MG tablet Take 1 tablet by mouth 4 (four) times daily as needed for diarrhea or loose stools. 09/04/23   Duanne Butler DASEN, MD  estradiol  (ESTRACE ) 0.01 % CREA vaginal cream PLACE 1 GRAM VAGINALLY AT BEDTIME 10/01/24   Jayne Vonn DEL, MD  fluconazole  (DIFLUCAN ) 150 MG tablet Take 1 tablet (150 mg total) by mouth once for 1 dose. 10/06/24 10/06/24 Yes Chandra Harlene LABOR, NP  fluticasone  (FLONASE ) 50 MCG/ACT nasal spray Place 2 sprays into both nostrils daily. Patient taking differently: Place 2 sprays into both nostrils daily as needed for allergies. 03/20/23   Duanne Butler DASEN, MD  HYDROcodone -acetaminophen  (NORCO/VICODIN) 5-325 MG tablet Take 0.5 tablets by mouth 2 (two) times daily as needed for severe pain.    [provider]  hydrOXYzine  (ATARAX /VISTARIL ) 25 MG tablet Take 1 tablet (25 mg total) by mouth 3 (three) times daily as needed. Patient taking differently: Take 25 mg by mouth at bedtime. 09/06/20   Crittenden, Theodoro FALCON, MD  ketoconazole  (NIZORAL ) 2 % cream Apply 1 Application topically 2 (two) times daily as needed for irritation. 10/06/24   Chandra Harlene LABOR, NP  lamoTRIgine  (LAMICTAL ) 100 MG tablet Take 100 mg by mouth 2 (two) times daily.    [provider]  LATUDA  80 MG TABS tablet Take 80 mg by mouth at bedtime. Patient taking differently: Take 80 mg by mouth in the morning. 02/11/17   [provider]  LORazepam  (ATIVAN ) 0.5 MG tablet Take 1 tablet (0.5 mg total) by mouth every 8 (eight) hours as needed for anxiety (itching). 09/04/23   Duanne Butler DASEN, MD  lurasidone  (LATUDA ) 20 MG TABS tablet Take 20 mg by mouth every morning. Patient taking differently: Take 40 mg by mouth at bedtime. 04/03/22   [provider]  meclizine  (ANTIVERT ) 25 MG tablet TAKE 1 TABLET AS NEEDED FOR DIZZINESS 01/21/24   Duanne Butler DASEN, MD  methocarbamol  (ROBAXIN ) 500 MG tablet Take 1 tablet (500 mg total) by mouth 4 (four) times daily. 12/11/23   Duanne Butler DASEN, MD  metoprolol  succinate (TOPROL -XL) 100 MG 24 hr tablet Take 1 tablet (100 mg total) by mouth daily. TAKE 1 TABLET AT BEDTIME WITH OR IMMEDIATELY FOLLOWING A MEAL  (CALL CLINIC TO SCHEDULE APPOINTMENT FOR FUTURE REFILLS. [phone]) 07/06/24   Duanne Butler DASEN, MD  montelukast  (SINGULAIR ) 10 MG tablet TAKE 1 TABLET(10 MG) BY MOUTH AT BEDTIME 09/20/24   Duanne Butler DASEN,  MD  ondansetron  (ZOFRAN ) 4 MG tablet Take 1 tablet (4 mg total) by mouth every 8 (eight) hours as needed for nausea or vomiting. 09/16/24   Duanne Butler DASEN, MD  ondansetron  (ZOFRAN -ODT) 4 MG disintegrating tablet Take 1 tablet (4 mg total) by mouth every 8 (eight) hours as needed for nausea or vomiting. 11/13/22   Stuart Vernell Norris, PA-C  promethazine  (PHENERGAN ) 25 MG tablet TAKE 1 TABLET BY MOUTH EVERY 6 HOURS AS NEEDED FOR NAUSEA OR VOMITING 09/01/24   Duanne Butler DASEN, MD  rizatriptan  (MAXALT ) 10 MG tablet Take 1 tablet (10 mg total) by mouth as needed for migraine. May repeat in 2 hours if needed 02/12/24   Lomax, Amy, NP  terconazole  (TERAZOL 7 ) 0.4 % vaginal cream Place 1 applicator vaginally at bedtime. Patient taking differently: Place 1 applicator vaginally at bedtime as needed (yeast infections). 04/28/23   Jayne Vonn DEL, MD  valACYclovir  (VALTREX ) 500 MG tablet TAKE 1 TABLET(500 MG) BY MOUTH DAILY AS NEEDED FOR BREAKOUTS 10/01/24   Duanne Butler DASEN, MD  zolpidem  (AMBIEN  CR) 6.25 MG CR tablet Take 6.25 mg by mouth at bedtime. 06/23/23   [provider]    Family History Family History  Problem Relation Age of Onset   Arthritis Mother    Asthma Mother    Hearing loss Mother    Hyperlipidemia Mother    Hypertension Mother    Miscarriages / Stillbirths Mother    Stroke Mother    Hearing loss Father    Hypertension Father    Stroke Father    Diabetes Father    Bell's palsy Father    Heart disease Father    Kidney disease Father    Cancer Brother        leukemia   Early death Brother     Arthritis Maternal Grandmother    Depression Maternal Grandmother    Diabetes Maternal Grandmother    Heart disease Maternal Grandmother        CHF   Miscarriages / Stillbirths Maternal Grandmother    Vision loss Maternal Grandmother    Alcohol abuse Maternal Grandfather    Heart attack Maternal Grandfather    Arthritis Paternal Grandmother    Heart disease Paternal Grandmother        massive heart attack   Miscarriages / Stillbirths Paternal Grandmother    Alcohol abuse Paternal Grandfather    Stroke Paternal Grandfather    Heart disease Paternal Grandfather        arteriosclerosis    Social History Social History   Tobacco Use   Smoking status: Former    Types: Cigarettes   Smokeless tobacco: Never   Tobacco comments:    Quit many years ago  Vaping Use   Vaping status: Never Used  Substance Use Topics   Alcohol use: No   Drug use: No     Allergies   Hydrocodone -acetaminophen , Milk-related compounds, and Oxycodone-acetaminophen    Review of Systems Review of Systems Per HPI  Physical Exam Triage Vital Signs ED Triage Vitals  Encounter Vitals Group     BP 10/06/24 1152 124/79     Girls Systolic BP Percentile --      Girls Diastolic BP Percentile --      Boys Systolic BP Percentile --      Boys Diastolic BP Percentile --      Pulse Rate 10/06/24 1152 63     Resp 10/06/24 1152 20     Temp 10/06/24 1152 97.8 F (36.6 C)  Temp Source 10/06/24 1152 Oral     SpO2 10/06/24 1152 95 %     Weight --      Height --      Head Circumference --      Peak Flow --      Pain Score 10/06/24 1153 3     Pain Loc --      Pain Education --      Exclude from Growth Chart --    No data found.  Updated Vital Signs BP 124/79 (BP Location: Right Arm)   Pulse 63   Temp 97.8 F (36.6 C) (Oral)   Resp 20   SpO2 95%   Visual Acuity Right Eye Distance:   Left Eye Distance:   Bilateral Distance:    Right Eye Near:   Left Eye Near:    Bilateral Near:      Physical Exam Vitals and nursing note reviewed.  Constitutional:      General: [name] not in acute distress.    Appearance: Normal appearance. [name] not toxic-appearing.  HENT:     Mouth/Throat:     Mouth: Mucous membranes are moist.     Pharynx: Oropharynx is clear.  Pulmonary:     Effort: Pulmonary effort is normal. No respiratory distress.  Skin:    General: Skin is warm and dry.     Capillary Refill: Capillary refill takes less than 2 seconds.     Findings: Rash present. Rash is scaling.     Comments: Erythematous, scaly plaques noted to bilateral groin.  No drainage or oozing.  Neurological:     Mental Status: [name] alert and oriented to person, place, and time.  Psychiatric:        Behavior: Behavior is cooperative.      UC Treatments / Results  Labs (all labs ordered are listed, but only abnormal results are displayed) Labs Reviewed - No data to display  EKG   Radiology No results found.  Procedures Procedures (including critical care time)  Medications Ordered in UC Medications - No data to display  Initial Impression / Assessment and Plan / UC Course  I have reviewed the triage vital signs and the nursing notes.  Pertinent labs & imaging results that were available during my care of the patient were reviewed by me and considered in my medical decision making (see chart for details).   Patient is well-appearing, normotensive, afebrile, not tachycardic, not tachypneic, oxygenating well on room air.   1. Candidiasis, intertrigo Treat with oral Diflucan  once, continue ketoconazole  cream and prescription sent to pharmacy Barrier methods discussed Return and ER precautions also discussed  The patient was given the opportunity to ask questions.  All questions answered to their satisfaction.  The patient is in agreement to this plan.   Final Clinical Impressions(s) / UC Diagnoses   Final diagnoses:  Candidiasis, intertrigo     Discharge Instructions       Continue keeping area in your groin clean and dry.  Apply a thin layer of the ketoconazole  cream up to twice daily as needed.  Take the Diflucan  pill today.  Seek care if symptoms do not improve significantly with treatment.   ED Prescriptions     Medication Sig Dispense Auth. Provider   fluconazole  (DIFLUCAN ) 150 MG tablet Take 1 tablet (150 mg total) by mouth once for 1 dose. 1 tablet Chandra Raisin A, NP   ketoconazole  (NIZORAL ) 2 % cream Apply 1 Application topically 2 (two) times daily as  needed for irritation. 60 g Chandra Harlene LABOR, NP      PDMP not reviewed this encounter.   Chandra Harlene LABOR, NP 10/06/24 [phone]

## 2024-10-06 NOTE — ED Triage Notes (Signed)
 Pt reports possible yeast infection, pt states she was recently seen and diagnosed with intertrigo, started out on her breast and is now on her legs and is very sore.

## 2024-10-06 NOTE — Discharge Instructions (Signed)
 Continue keeping area in your groin clean and dry.  Apply a thin layer of the ketoconazole  cream up to twice daily as needed.  Take the Diflucan  pill today.  Seek care if symptoms do not improve significantly with treatment.

## 2024-10-13 DIAGNOSIS — F3131 Bipolar disorder, current episode depressed, mild: Secondary | ICD-10-CM | POA: Diagnosis not present

## 2024-10-14 NOTE — Progress Notes (Signed)
 10/20/24 ALL: Peggy Fitzgerald returns for Botox . [name] doing well. Migraines are fairly well managed. She endorses more stress with her dad needing to live with her.   07/27/2024 ALL: Peggy Fitzgerald returns for Botox . Last procedure 02/2024. She had an appt with me 05/2024 but I had to reschedule. She was placed on wait list but reports never being contacted to move appt. [name] having daily headaches. [name] taking diclofenac  every day. She has not filled rizatriptan  recently as she did not know it was at [facility].   02/12/2024 ALL: Peggy Fitzgerald returns for Botox . Last procedure 10/2023. She reports doing well. She has had more headaches over the past month. Under more stress as father may have cancer. [name] using rizatriptan  and diclofenac  as needed and this works well for abortive therapy.   10/15/2023 ALL: Peggy Fitzgerald returns for Botox . She reports migraines seem better until next procedure is due. Rizatriptan  works well.   07/21/2023 ALL: Peggy Fitzgerald returns for Botox . Last procedure 04/2023. She feels headaches may have been better for a few weeks after last Botox  treatment but have worsened over the past month. She reduced dose of Excedrin. Only using a couple times a week. She has taken rizatriptan  more over the past month. Not drinking Energy drinks everyday but still drinks regularly. I have asked her to document migraines and treatment for the net 12 weeks.   04/22/2023 ALL: She returns for Botox . Last procedure 01/2023. She reports daily headaches continue. She may have 3-4 bad migraines per month. She continues Excedrin most every day. Usually 3-4 tablets of rizatriptan . [name] drinking Celsius Enerdy Drinks regularly. Maybe 16 ounces of water daily. Advised of rebound headaches and encouraged her to increase water and decrease caffeine .      Consent Form Botulism Toxin Injection For Chronic Migraine    Reviewed orally with patient, additionally signature is on file:  Botulism toxin has been approved by the  Federal drug administration for treatment of chronic migraine. Botulism toxin does not cure chronic migraine and it may not be effective in some patients.  The administration of botulism toxin is accomplished by injecting a small amount of toxin into the muscles of the neck and head. Dosage must be titrated for each individual. Any benefits resulting from botulism toxin tend to wear off after 3 months with a repeat injection required if benefit is to be maintained. Injections are usually done every 3-4 months with maximum effect peak achieved by about 2 or 3 weeks. Botulism toxin is expensive and you should be sure of what costs you will incur resulting from the injection.  The side effects of botulism toxin use for chronic migraine may include:   -Transient, and usually mild, facial weakness with facial injections  -Transient, and usually mild, head or neck weakness with head/neck injections  -Reduction or loss of forehead facial animation due to forehead muscle weakness  -Eyelid drooping  -Dry eye  -Pain at the site of injection or bruising at the site of injection  -Double vision  -Potential unknown long term risks   Contraindications: You should not have Botox  if you are pregnant, nursing, allergic to albumin, have an infection, skin condition, or muscle weakness at the site of the injection, or have myasthenia gravis, Lambert-Eaton syndrome, or ALS.  It is also possible that as with any injection, there may be an allergic reaction or no effect from the medication. Reduced effectiveness after repeated injections is sometimes seen and rarely infection at the injection site  may occur. All care will be taken to prevent these side effects. If therapy is given over a long time, atrophy and wasting in the muscle injected may occur. Occasionally the patient's become refractory to treatment because they develop antibodies to the toxin. In this event, therapy needs to be modified.  I have read the  above information and consent to the administration of botulism toxin.    BOTOX  PROCEDURE NOTE FOR MIGRAINE HEADACHE  Contraindications and precautions discussed with patient(above). Aseptic procedure was observed and patient tolerated procedure. Procedure performed by Greig Forbes, FNP-C.   The condition has existed for more than 6 months, and pt does not have a diagnosis of ALS, Myasthenia Gravis or Lambert-Eaton Syndrome.  Risks and benefits of injections discussed and pt agrees to proceed with the procedure.  Written consent obtained  These injections are medically necessary. Pt  receives good benefits from these injections. These injections do not cause sedations or hallucinations which the oral therapies may cause.   Description of procedure:  The patient was placed in a sitting position. The standard protocol was used for Botox  as follows, with 5 units of Botox  injected at each site:  -Procerus muscle, midline injection  -Corrugator muscle, bilateral injection  -Frontalis muscle, bilateral injection, with 2 sites each side, medial injection was performed in the upper one third of the frontalis muscle, in the region vertical from the medial inferior edge of the superior orbital rim. The lateral injection was again in the upper one third of the forehead vertically above the lateral limbus of the cornea, 1.5 cm lateral to the medial injection site.  -Temporalis muscle injection, 4 sites, bilaterally. The first injection was 3 cm above the tragus of the ear, second injection site was 1.5 cm to 3 cm up from the first injection site in line with the tragus of the ear. The third injection site was 1.5-3 cm forward between the first 2 injection sites. The fourth injection site was 1.5 cm posterior to the second injection site. 5th site laterally in the temporalis  muscleat the level of the outer canthus.  -Occipitalis muscle injection, 3 sites, bilaterally. The first injection was done one half  way between the occipital protuberance and the tip of the mastoid process behind the ear. The second injection site was done lateral and superior to the first, 1 fingerbreadth from the first injection. The third injection site was 1 fingerbreadth superiorly and medially from the first injection site.  -Cervical paraspinal muscle injection, 2 sites, bilaterally. The first injection site was 1 cm from the midline of the cervical spine, 3 cm inferior to the lower border of the occipital protuberance. The second injection site was 1.5 cm superiorly and laterally to the first injection site.  -Trapezius muscle injection was performed at 3 sites, bilaterally. The first injection site was in the upper trapezius muscle halfway between the inflection point of the neck, and the acromion. The second injection site was one half way between the acromion and the first injection site. The third injection was done between the first injection site and the inflection point of the neck.   Will return for repeat injection in 3 months.   A total of 200 units of Botox  was prepared, 155 units of Botox  was injected as documented above, any Botox  not injected was wasted. The patient tolerated the procedure well, there were no complications of the above procedure.

## 2024-10-19 ENCOUNTER — Ambulatory Visit: Admitting: Family Medicine

## 2024-10-20 ENCOUNTER — Ambulatory Visit (INDEPENDENT_AMBULATORY_CARE_PROVIDER_SITE_OTHER): Admitting: Family Medicine

## 2024-10-20 ENCOUNTER — Encounter: Payer: Self-pay | Admitting: Family Medicine

## 2024-10-20 VITALS — BP 130/88 | HR 65

## 2024-10-20 DIAGNOSIS — G43701 Chronic migraine without aura, not intractable, with status migrainosus: Secondary | ICD-10-CM | POA: Diagnosis not present

## 2024-10-20 MED ORDER — ONABOTULINUMTOXINA 200 UNITS IJ SOLR
155.0000 [IU] | Freq: Once | INTRAMUSCULAR | Status: AC
  Administered 2024-10-20: 155 [IU] via INTRAMUSCULAR

## 2024-10-20 NOTE — Progress Notes (Signed)
 Botox - 200 units x 1 vial Lot: [id number] Expiration: 6/27 NDC: 9976-6078-97  Bacteriostatic 0.9% Sodium Chloride - 4 mL  Lot: [id number] Expiration: 10/08/2025 NDC: 9590-8033-97  Dx: H56.298 B/B Witnessed by Rojean, CMA

## 2024-10-29 DIAGNOSIS — F3131 Bipolar disorder, current episode depressed, mild: Secondary | ICD-10-CM | POA: Diagnosis not present

## 2024-11-05 DIAGNOSIS — F3131 Bipolar disorder, current episode depressed, mild: Secondary | ICD-10-CM | POA: Diagnosis not present

## 2024-11-10 DIAGNOSIS — F3131 Bipolar disorder, current episode depressed, mild: Secondary | ICD-10-CM | POA: Diagnosis not present

## 2024-11-11 DIAGNOSIS — M25511 Pain in right shoulder: Secondary | ICD-10-CM | POA: Diagnosis not present

## 2024-11-11 DIAGNOSIS — M25551 Pain in right hip: Secondary | ICD-10-CM | POA: Diagnosis not present

## 2024-11-11 DIAGNOSIS — M25561 Pain in right knee: Secondary | ICD-10-CM | POA: Diagnosis not present

## 2024-11-18 ENCOUNTER — Other Ambulatory Visit: Payer: Self-pay

## 2024-11-18 MED ORDER — DICLOFENAC POTASSIUM 50 MG PO TABS: ORAL_TABLET | ORAL | 3 refills | Status: AC

## 2024-11-18 NOTE — Telephone Encounter (Signed)
 Refill request for Diclofenac . Rx sent.   Per chart patient has not been seen by provider for OV since 2023 (gets routine Botox  tx). Please advise if ROV is needed. Thank you!

## 2024-12-07 ENCOUNTER — Ambulatory Visit

## 2024-12-07 ENCOUNTER — Other Ambulatory Visit (HOSPITAL_COMMUNITY)
Admission: RE | Admit: 2024-12-07 | Discharge: 2024-12-07 | Disposition: A | Discharge status: Home / Self Care | Source: Ambulatory Visit | Attending: Obstetrics & Gynecology | Admitting: Obstetrics & Gynecology

## 2024-12-07 DIAGNOSIS — N898 Other specified noninflammatory disorders of vagina: Secondary | ICD-10-CM

## 2024-12-07 NOTE — Progress Notes (Signed)
" ° °  NURSE VISIT- VAGINITIS  SUBJECTIVE:  Peggy Fitzgerald is a 68 y.o. [phone] GYN patientfemale here for a vaginal swab for vaginitis screening.  She reports the following symptoms: local irritation for 2-3 days. Just doesn't feel right Denies abnormal vaginal bleeding, significant pelvic pain, fever, or UTI symptoms.  OBJECTIVE:  There were no vitals taken for this visit.  Appears well, in no apparent distress  ASSESSMENT: Vaginal swab for vaginitis screening  PLAN: Self-collected vaginal probe for Bacterial Vaginosis, Yeast sent to lab Treatment: to be determined once results are received Follow-up as needed if symptoms persist/worsen, or new symptoms develop  Verlia Kaney  12/07/2024 1:46 PM  "

## 2024-12-10 ENCOUNTER — Telehealth: Payer: Self-pay | Admitting: Obstetrics & Gynecology

## 2024-12-10 LAB — CERVICOVAGINAL ANCILLARY ONLY
Bacterial Vaginitis (gardnerella): NEGATIVE
Candida Glabrata: NEGATIVE
Candida Vaginitis: NEGATIVE
Comment: NEGATIVE
Comment: NEGATIVE
Comment: NEGATIVE

## 2024-12-10 NOTE — Telephone Encounter (Signed)
 Request call back to discuss labs.

## 2024-12-10 NOTE — Telephone Encounter (Signed)
 Returned patient's call. LMOVM swab was negative. Advised to call on Monday to make an appointment with provider if she was still having issues.

## 2024-12-13 ENCOUNTER — Ambulatory Visit: Payer: Self-pay | Admitting: Adult Health

## 2024-12-19 ENCOUNTER — Encounter: Payer: Self-pay | Admitting: Family Medicine

## 2024-12-30 ENCOUNTER — Encounter: Payer: Self-pay | Admitting: Family Medicine

## 2024-12-31 ENCOUNTER — Other Ambulatory Visit: Payer: Self-pay | Admitting: Family Medicine

## 2024-12-31 MED ORDER — ZOLPIDEM TARTRATE ER 6.25 MG PO TBCR: 6.2500 mg | EXTENDED_RELEASE_TABLET | Freq: Every day | ORAL | 0 refills | Status: AC

## 2025-01-04 ENCOUNTER — Other Ambulatory Visit: Payer: Self-pay | Admitting: Family Medicine

## 2025-01-04 DIAGNOSIS — E785 Hyperlipidemia, unspecified: Secondary | ICD-10-CM

## 2025-01-05 ENCOUNTER — Telehealth: Payer: Self-pay | Admitting: Family Medicine

## 2025-01-05 NOTE — Telephone Encounter (Signed)
 Submitted PA renewal via CMM, status is pending. Key: [id number]

## 2025-01-11 NOTE — Telephone Encounter (Signed)
 Previous PA was extended.  Auth#: [id number] (07/27/24-12/08/25)

## 2025-01-31 ENCOUNTER — Ambulatory Visit: Admitting: Family Medicine

## 2025-08-03 ENCOUNTER — Ambulatory Visit
